# Patient Record
Sex: Male | Born: 1955 | Race: White | Hispanic: No | Marital: Single | Smoking: Never smoker
Health system: Southern US, Community
[De-identification: ages and names within clinical notes are randomized; demographics above are authoritative.]

## PROBLEM LIST (undated history)

## (undated) DIAGNOSIS — K5190 Ulcerative colitis, unspecified, without complications: Secondary | ICD-10-CM

## (undated) DIAGNOSIS — F329 Major depressive disorder, single episode, unspecified: Secondary | ICD-10-CM

## (undated) DIAGNOSIS — N529 Male erectile dysfunction, unspecified: Secondary | ICD-10-CM

## (undated) DIAGNOSIS — D497 Neoplasm of unspecified behavior of endocrine glands and other parts of nervous system: Secondary | ICD-10-CM

## (undated) DIAGNOSIS — Z932 Ileostomy status: Secondary | ICD-10-CM

## (undated) DIAGNOSIS — E291 Testicular hypofunction: Secondary | ICD-10-CM

## (undated) DIAGNOSIS — F32A Depression, unspecified: Secondary | ICD-10-CM

## (undated) DIAGNOSIS — D352 Benign neoplasm of pituitary gland: Secondary | ICD-10-CM

## (undated) DIAGNOSIS — H8110 Benign paroxysmal vertigo, unspecified ear: Secondary | ICD-10-CM

## (undated) DIAGNOSIS — K828 Other specified diseases of gallbladder: Secondary | ICD-10-CM

## (undated) DIAGNOSIS — K56609 Unspecified intestinal obstruction, unspecified as to partial versus complete obstruction: Secondary | ICD-10-CM

## (undated) DIAGNOSIS — Z9289 Personal history of other medical treatment: Secondary | ICD-10-CM

## (undated) DIAGNOSIS — C801 Malignant (primary) neoplasm, unspecified: Secondary | ICD-10-CM

## (undated) DIAGNOSIS — T7840XA Allergy, unspecified, initial encounter: Secondary | ICD-10-CM

## (undated) HISTORY — DX: Ileostomy status: Z93.2

## (undated) HISTORY — DX: Ulcerative colitis, unspecified, without complications: K51.90

## (undated) HISTORY — DX: Depression, unspecified: F32.A

## (undated) HISTORY — DX: Malignant (primary) neoplasm, unspecified: C80.1

## (undated) HISTORY — DX: Major depressive disorder, single episode, unspecified: F32.9

## (undated) HISTORY — PX: KNEE ARTHROSCOPY: SUR90

## (undated) HISTORY — DX: Other specified diseases of gallbladder: K82.8

## (undated) HISTORY — DX: Testicular hypofunction: E29.1

## (undated) HISTORY — DX: Benign neoplasm of pituitary gland: D35.2

## (undated) HISTORY — DX: Benign paroxysmal vertigo, unspecified ear: H81.10

## (undated) HISTORY — DX: Unspecified intestinal obstruction, unspecified as to partial versus complete obstruction: K56.609

## (undated) HISTORY — DX: Allergy, unspecified, initial encounter: T78.40XA

## (undated) HISTORY — DX: Male erectile dysfunction, unspecified: N52.9

---

## 1998-01-08 ENCOUNTER — Ambulatory Visit (HOSPITAL_COMMUNITY): Admission: RE | Admit: 1998-01-08 | Discharge: 1998-01-08 | Payer: Self-pay | Admitting: Gastroenterology

## 1999-08-11 ENCOUNTER — Encounter: Payer: Self-pay | Admitting: Internal Medicine

## 1999-08-11 ENCOUNTER — Encounter: Admission: RE | Admit: 1999-08-11 | Discharge: 1999-08-11 | Payer: Self-pay | Admitting: Internal Medicine

## 1999-08-21 ENCOUNTER — Encounter (HOSPITAL_COMMUNITY): Admission: RE | Admit: 1999-08-21 | Discharge: 1999-11-19 | Payer: Self-pay | Admitting: Gastroenterology

## 2000-01-22 ENCOUNTER — Encounter: Admission: RE | Admit: 2000-01-22 | Discharge: 2000-03-08 | Payer: Self-pay | Admitting: Internal Medicine

## 2001-04-12 HISTORY — PX: COLON SURGERY: SHX602

## 2002-01-30 ENCOUNTER — Ambulatory Visit (HOSPITAL_COMMUNITY): Admission: RE | Admit: 2002-01-30 | Discharge: 2002-01-30 | Payer: Self-pay | Admitting: Gastroenterology

## 2002-02-12 ENCOUNTER — Encounter (INDEPENDENT_AMBULATORY_CARE_PROVIDER_SITE_OTHER): Payer: Self-pay | Admitting: Specialist

## 2002-02-12 ENCOUNTER — Inpatient Hospital Stay (HOSPITAL_COMMUNITY): Admission: RE | Admit: 2002-02-12 | Discharge: 2002-02-20 | Payer: Self-pay | Admitting: General Surgery

## 2005-04-19 ENCOUNTER — Emergency Department (HOSPITAL_COMMUNITY): Admission: EM | Admit: 2005-04-19 | Discharge: 2005-04-20 | Payer: Self-pay | Admitting: Emergency Medicine

## 2007-04-13 DIAGNOSIS — C801 Malignant (primary) neoplasm, unspecified: Secondary | ICD-10-CM

## 2007-04-13 DIAGNOSIS — D497 Neoplasm of unspecified behavior of endocrine glands and other parts of nervous system: Secondary | ICD-10-CM

## 2007-04-13 HISTORY — DX: Malignant (primary) neoplasm, unspecified: C80.1

## 2007-04-13 HISTORY — PX: RECTAL SURGERY: SHX760

## 2007-04-13 HISTORY — DX: Neoplasm of unspecified behavior of endocrine glands and other parts of nervous system: D49.7

## 2007-09-28 ENCOUNTER — Inpatient Hospital Stay (HOSPITAL_COMMUNITY): Admission: AD | Admit: 2007-09-28 | Discharge: 2007-10-09 | Payer: Self-pay | Admitting: Internal Medicine

## 2007-10-20 ENCOUNTER — Inpatient Hospital Stay (HOSPITAL_COMMUNITY): Admission: AD | Admit: 2007-10-20 | Discharge: 2007-10-26 | Payer: Self-pay | Admitting: Internal Medicine

## 2008-07-23 IMAGING — CR DG ABDOMEN 2V
2 series · 2 of 2 positions shown · non-contrast
Comparison: 09/29/2007.

CLINICAL DATA: Followup small bowel obstruction.

ABDOMEN - 2 VIEW

[w abdomen upright]
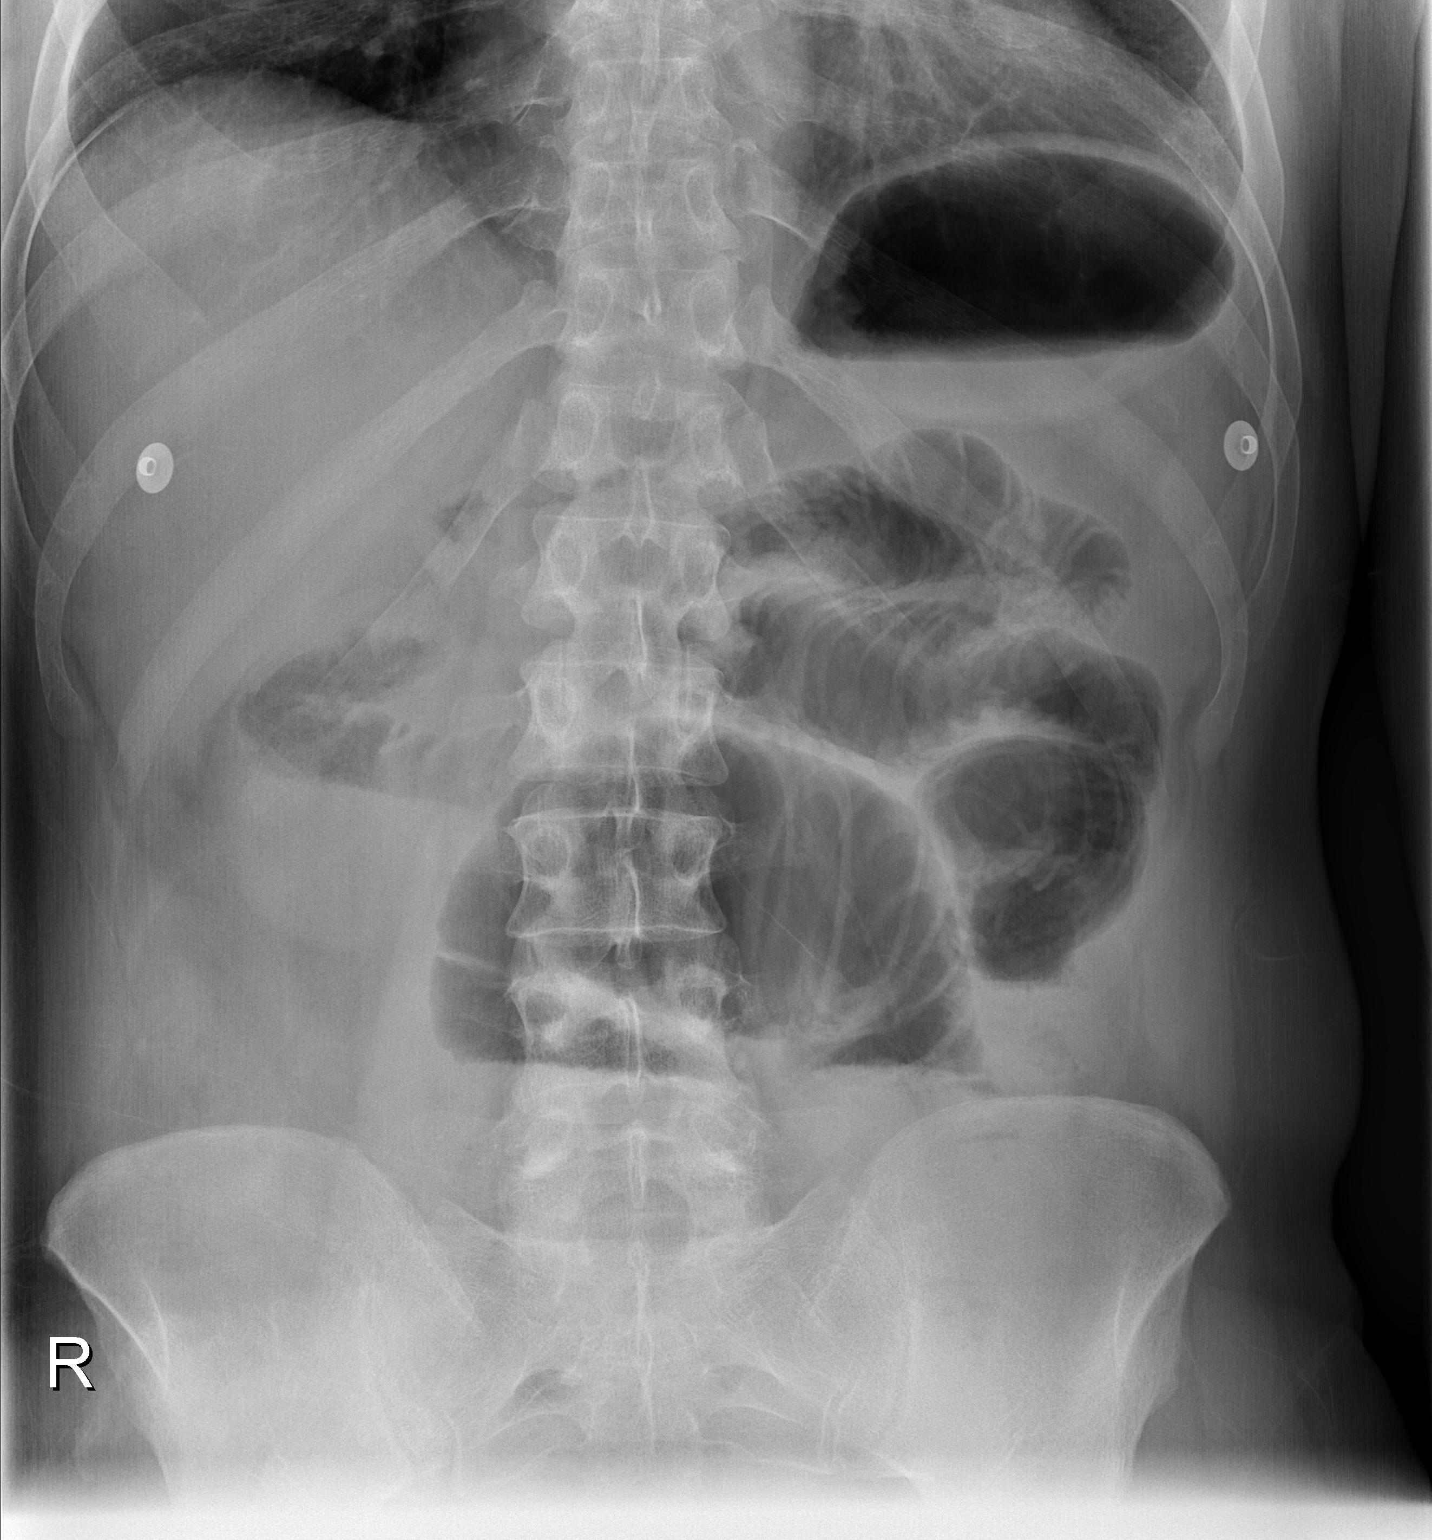

[t abdomen supine]
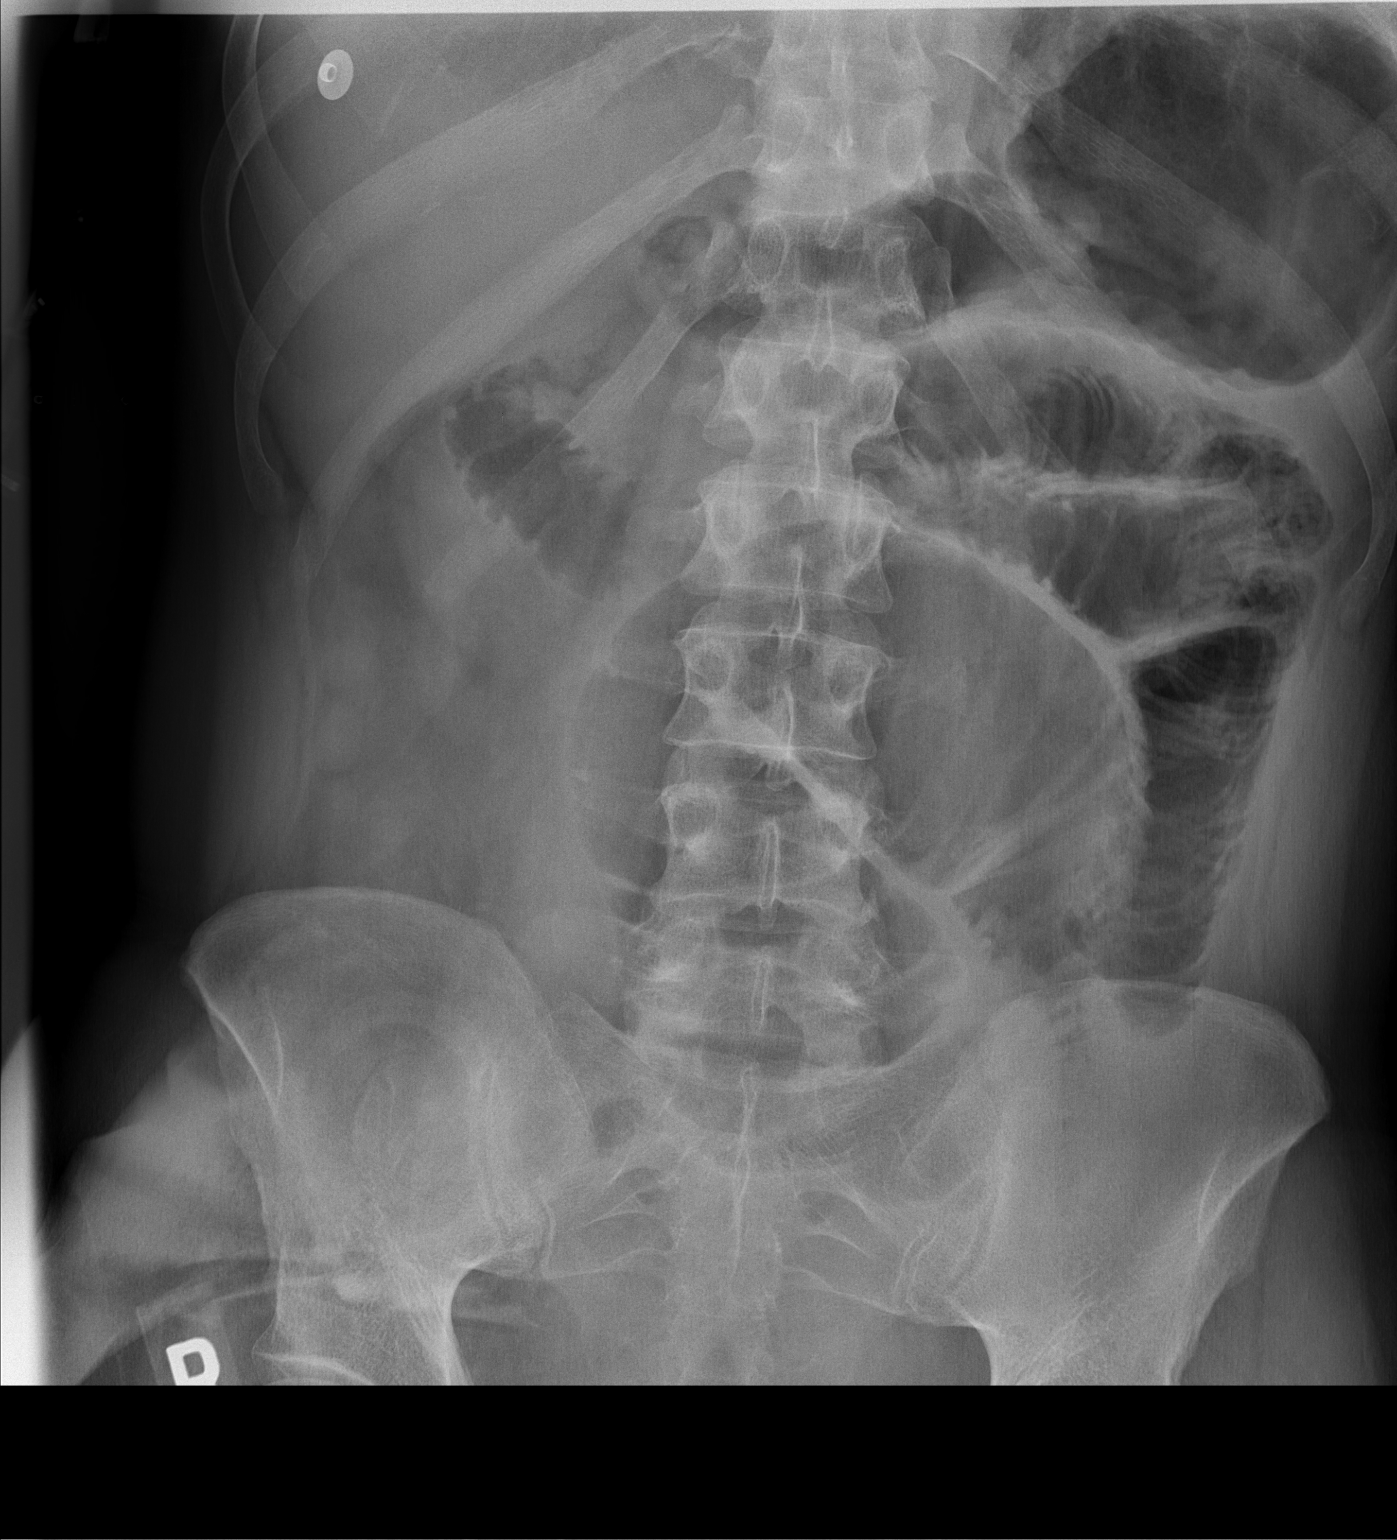

[2 of 2 positions shown; findings below may reference images not displayed]

FINDINGS: Prominent small bowel obstructive pattern with small
bowel measuring up to 8.3 cm versus prior 7.8 cm.  This appears to
be centered in the ileum.  Right lower abdominal/pelvic colostomy
formation.  No free intraperitoneal air.
IMPRESSION: Persistent small bowel obstructive pattern with progressive
dilation of small bowel loops as noted above.

## 2008-07-27 IMAGING — CR DG ABDOMEN 2V
2 series · 2 of 2 positions shown · non-contrast
Comparison: 10/01/2007

CLINICAL DATA: Small bowel obstruction

ABDOMEN - 2 VIEW

[w abdomen upright]
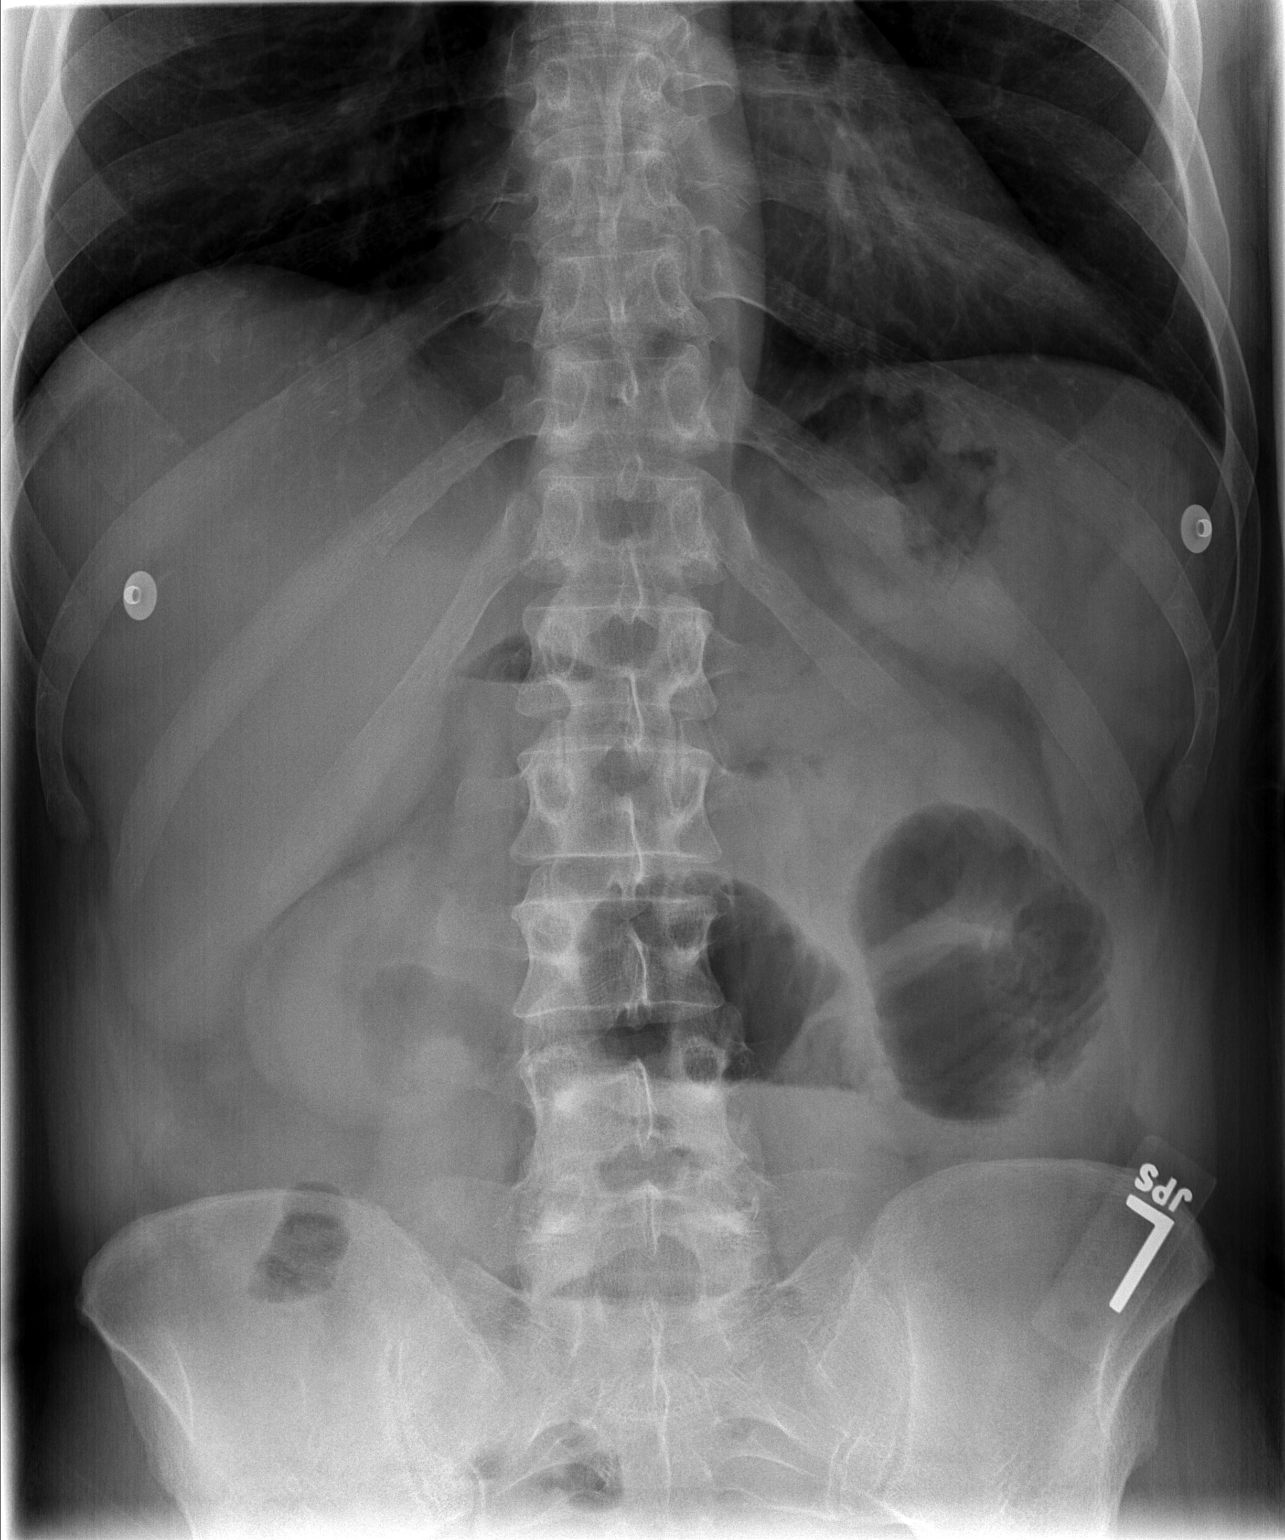

[t abdomen supine]
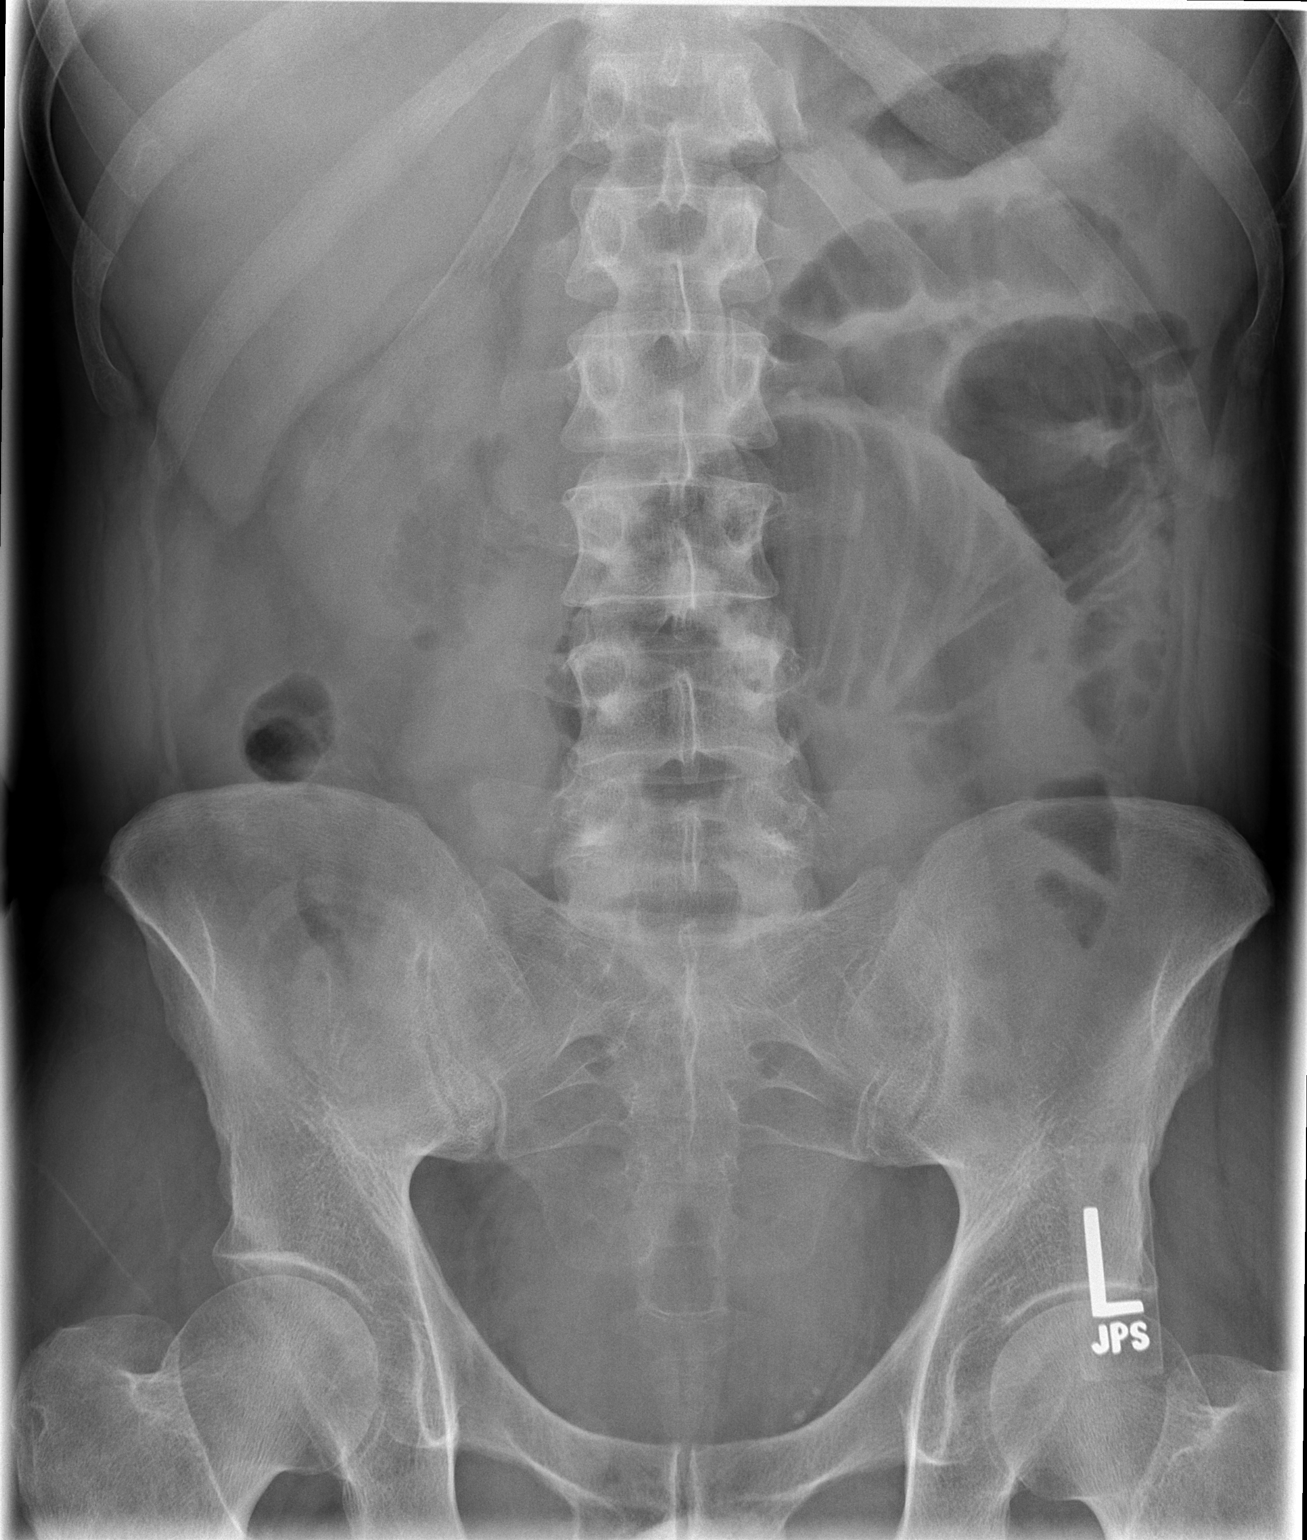

[2 of 2 positions shown; findings below may reference images not displayed]

FINDINGS: Upright abdomen shows no evidence for intraperitoneal
free air.  On the supine film, there is persistent marked dilation
of a small bowel loop in the left abdomen, measuring nearly 8 cm in
diameter.  Overall, there is been no substantial change in the
bowel gas pattern with most of the visualized air in the left upper
quadrant, suggesting proximal small bowel location.
IMPRESSION: No intraperitoneal free air.

Persistent marked dilation of a small bowel loop in the left
abdomen, measuring nearly 8 cm in diameter.  No interval change in
the bowel gas pattern.

## 2008-07-29 IMAGING — CR DG ABDOMEN 2V
2 series · 2 of 2 positions shown · non-contrast
Comparison: Two-view abdomen x-rays yesterday and dating back to
09/29/2007.

CLINICAL DATA: Follow up small bowel obstruction.

ABDOMEN - 2 VIEW 10/05/2007:

[w abdomen upright]
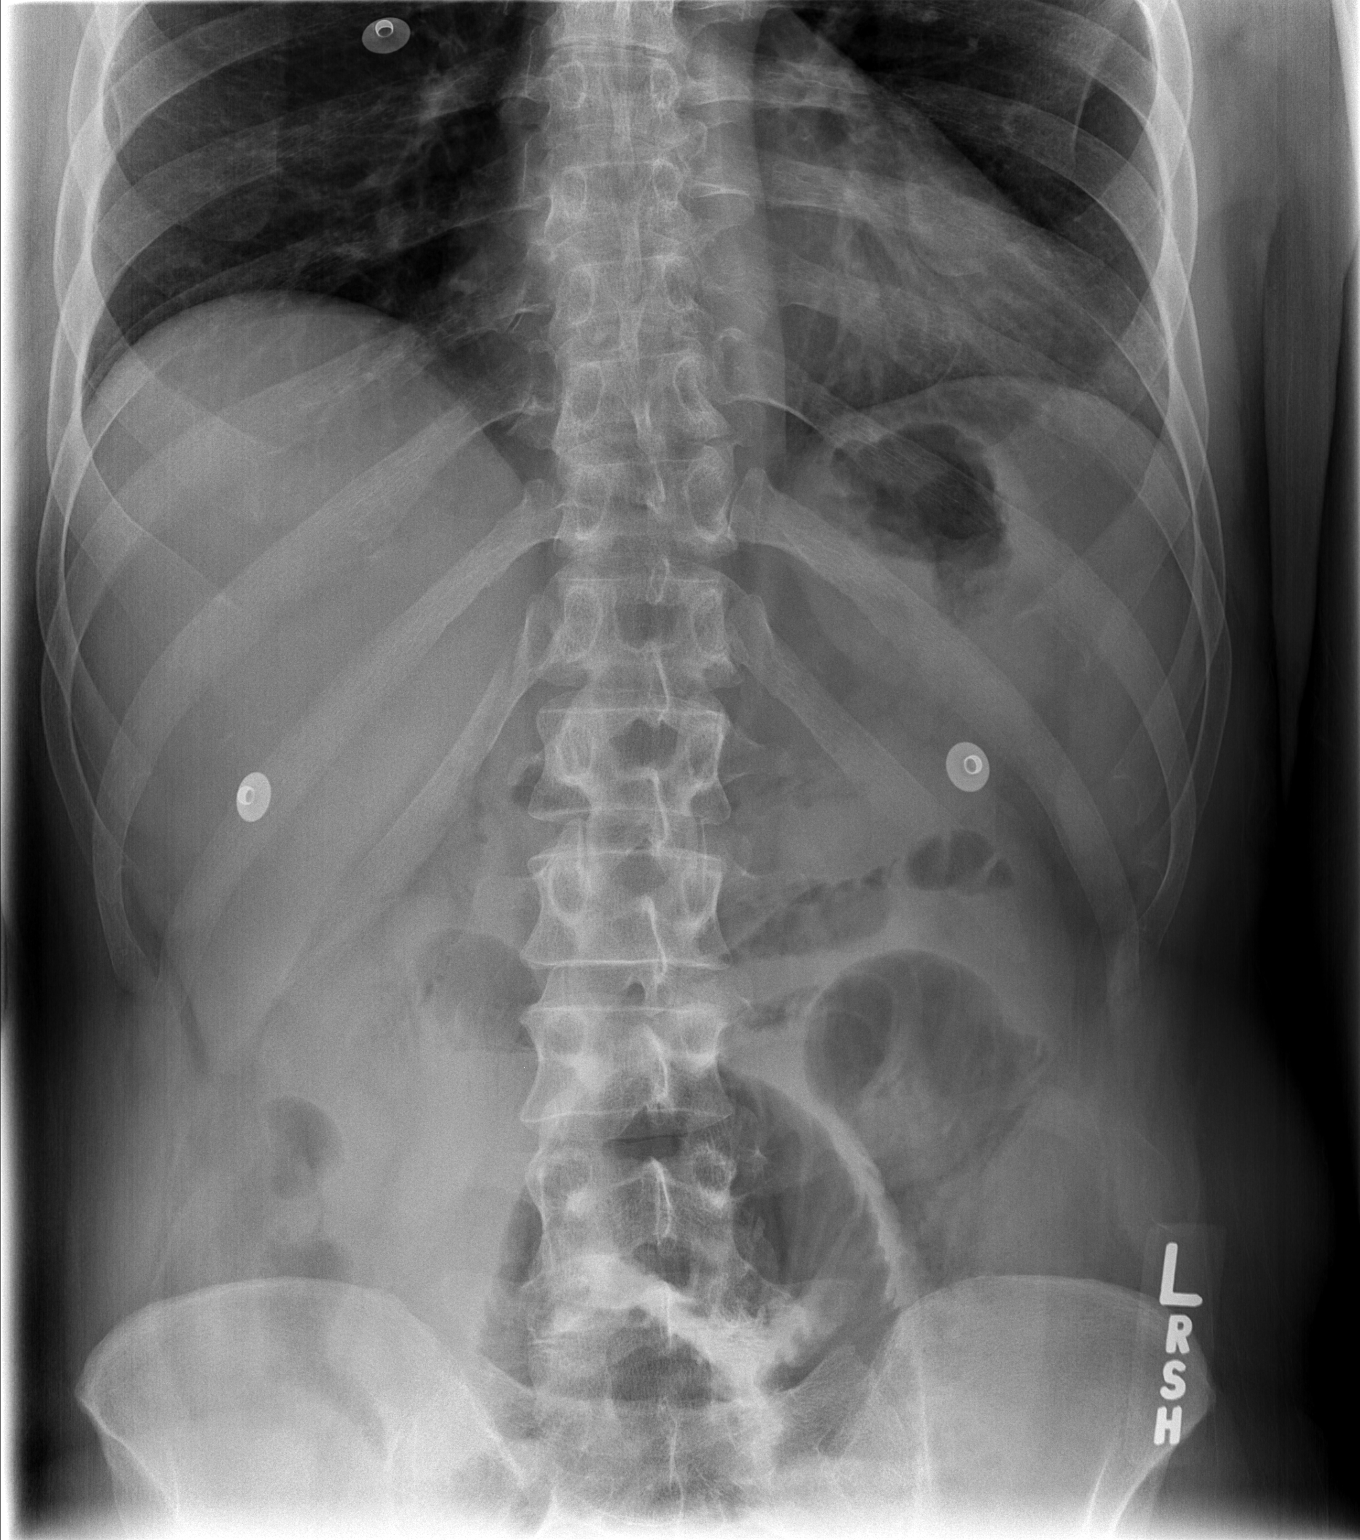

[t abdomen supine]
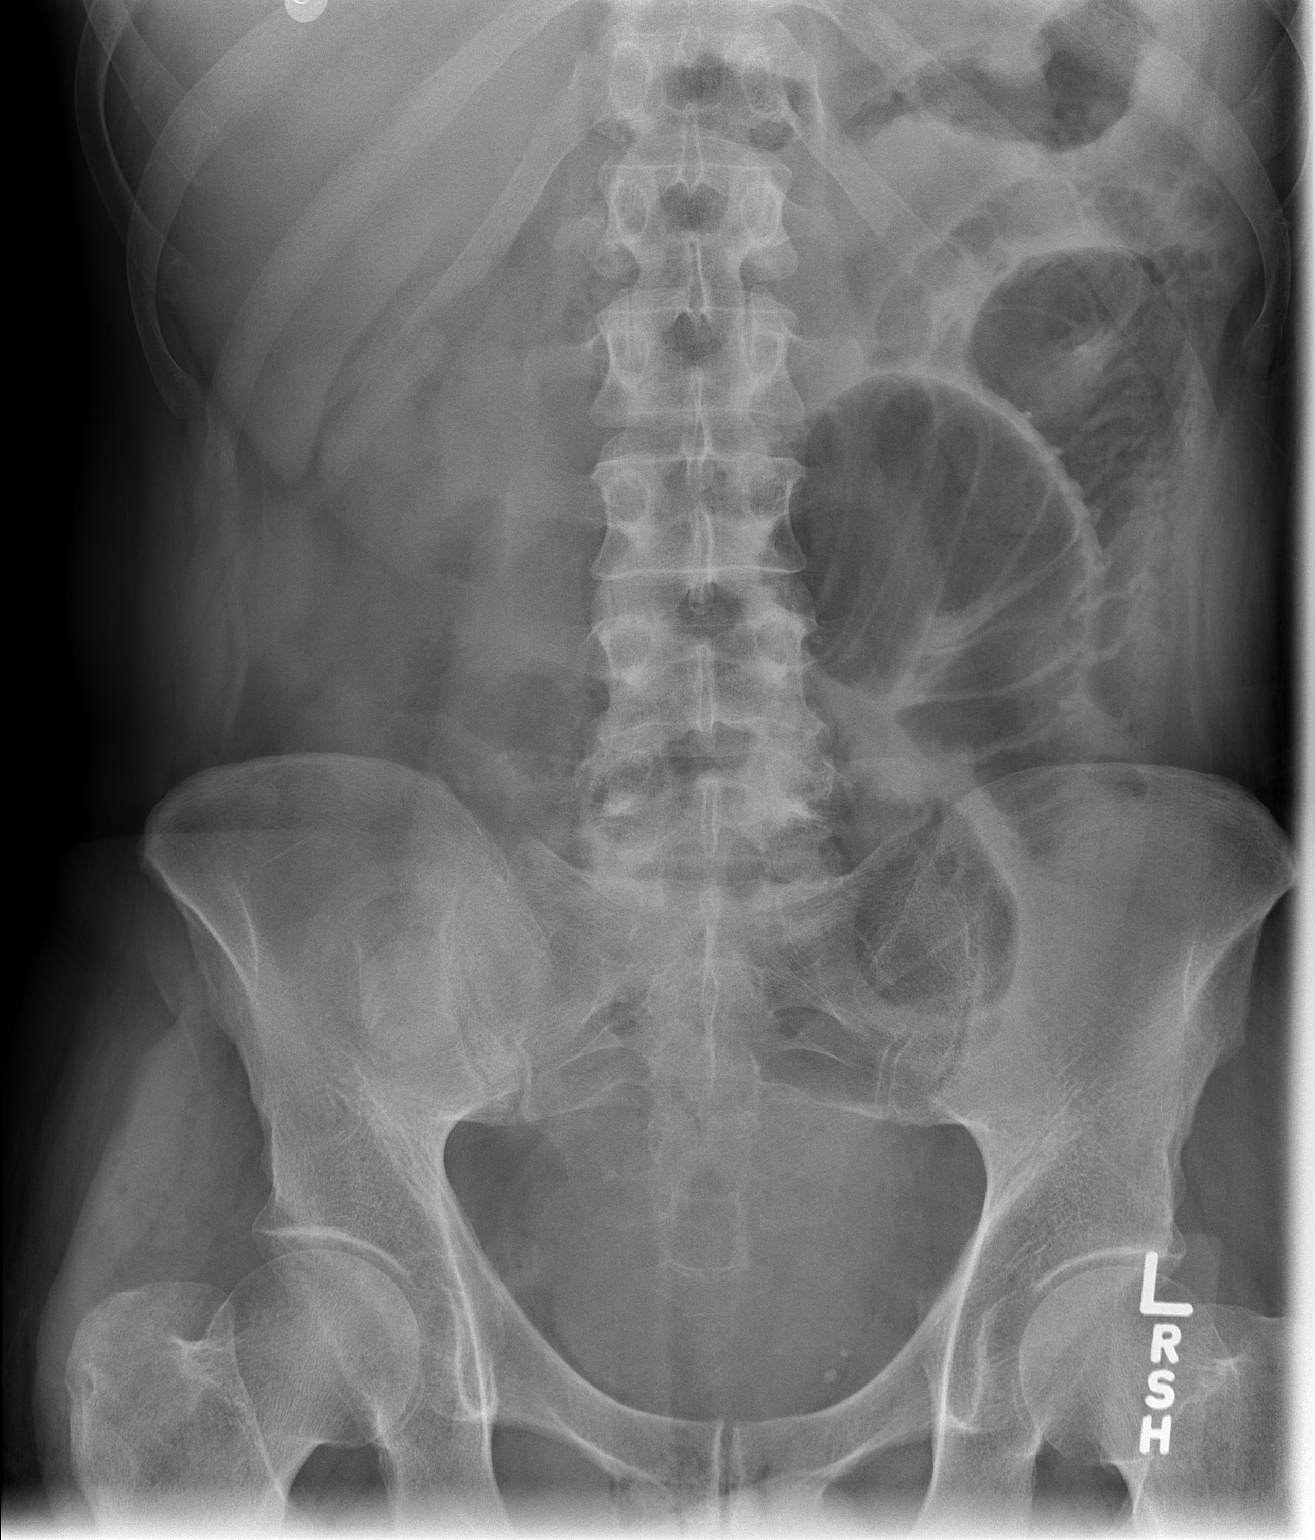

[2 of 2 positions shown; findings below may reference images not displayed]

FINDINGS: Persistent marked dilation of a few loops of jejunum in
the left upper quadrant, not significantly changed since yesterday.
The overall bowel gas pattern has improved slightly over the past 6
days.  Air-fluid levels present on the erect image.  No evidence of
free intraperitoneal air.  Very little colonic bowel gas.
Phleboliths again noted in the left side of the pelvis.
IMPRESSION: No significant change in the high-grade partial small bowel
obstruction since yesterday.  No free intraperitoneal air.  Mild
improvement in the bowel gas pattern over the past 6 days.

## 2009-06-11 ENCOUNTER — Encounter: Admission: RE | Admit: 2009-06-11 | Discharge: 2009-06-11 | Payer: Self-pay | Admitting: Internal Medicine

## 2010-04-09 ENCOUNTER — Encounter
Admission: RE | Admit: 2010-04-09 | Discharge: 2010-04-09 | Payer: Self-pay | Source: Home / Self Care | Attending: Internal Medicine | Admitting: Internal Medicine

## 2010-08-25 NOTE — H&P (Signed)
NAME:  [name], Brandon [name]                ACCOUNT NO.:  [id number]   MEDICAL RECORD NO.:  [id number]          PATIENT TYPE:  INP   LOCATION:                               FACILITY:  [name]   PHYSICIAN:  [name], M.D.DATE OF BIRTH:  March 21, 1956   DATE OF ADMISSION:  10/20/2007  DATE OF DISCHARGE:                              HISTORY & PHYSICAL   CURRENT HISTORY:  Mr. Brandon [name] a very pleasant but unfortunate 55-  year-old male with recent hospitalization at [name] in  early June for a small bowel obstruction and persistent surgical wound  where he recently had removal of a rectal pouch at the [name]  in May of 2009.   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  Apparently his wound depth  was only 1 cm when he left the [name] 2 days ago but today the  wound depth is 6 cm at one end of the wound.  It is draining purulent  fluid.   Nutrition is improved with TNA therapy and recent tolerance of full  liquids.  However, his condition is tenuous and the surgical wound seems  to have rapidly changed within 48 hours and almost certainly needs  reapplication of a wound VAC and to also be cultured for possible  antibiotic therapy.  He also needs ongoing TNA therapy, which has not  been arranged yet here in [name].   His clinical condition is complicated by the fact that he had radiation  therapy and chemotherapy prior to the removal of the rectal pouch  carcinoma in May, likely retarding wound healing as well.   He is now admitted for prompt reinitiation of wound VAC because of his  rapid decline in the wound status over 48 hours and continued parenteral  nutrition.  The hope is for a 4-7 day stay only.   ALLERGIES:  CODEINE AND  IMURAN CAUSED NAUSEA AND VOMITING.  EFFEXOR  GIVES HIM A FLAT FEELING.   MEDICATIONS:  TNA therapy.   PAST MEDICAL HISTORY:  1. Ulcerative colitis diagnosed in the 1970s.  He ultimately treated      with colectomy per Dr. Kendrick [name] in 2003.  2. Allergic rhinitis.  3. 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  4. Ileostomy.  5. Chronic slowly resolving partial small bowel obstruction since      abdominal surgery in May of 2009.  Currently beginning to tolerate      fluid, full liquids, but parenteral nutrition continues to be      advised.  6. Developing depression.  No recurrent clinical status.   PAST SURGICAL HISTORY:  1. As above.  2. Knee surgery in December of 1990.   FAMILY HISTORY:  Prostate cancer in his father.  Some anxiety in his  siblings and parents.   SOCIAL HISTORY:  No tobacco use or alcohol use.  He is single.  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   REVIEW OF SYSTEMS:  No chest pain or shortness of breath.  No fevers or  chills.  Markedly depressed mood though non suicidal.  No recent nausea  or vomiting.  He continues to have drainage from the peri sacral wound.   PHYSICAL EXAMINATION:  An alert, oriented male in no acute distress.  Affect is very somber.  VITAL SIGNS:  Weight 164.  Temperature 98.7.  Pulse 108 and regular.  Blood pressure 106/82.  Respirations 16, nonlabored.  HEENT:  Exam benign.  He wears glasses.  NECK:  Supple without JVD or thyromegaly.  No lymphadenopathy.  CHEST:  Clear to auscultation.  CARDIAC:  Exam reveals a regular rhythm.  No murmurs or gallops.  ABDOMEN:  Soft, nontender.  Nondistended.  Bowel sounds are normal.  No  rebound.  Ileostomy site within normal limits.  GU:   Not examined.  Gluteal cleft area reveals a 5-cm long wound that is  approximately 1.5 cm in depth with good granulation tissue except at the  lowest area of the wound.  In the gluteal cleft there is an  approximately 6 cm deep cavity which is was probed with a sterile Q-tip,  which is draining brown, serous, slightly foul-smelling fluid.  This was  sent for culture.  EXTREMITIES:  Without cyanosis, clubbing or edema.  NEUROLOGICAL:  Nonfocal.  SKIN:  Without rashes.   ASSESSMENT:  A 55 year old male with a persistent surgical wound over  the lower perisacral area which appears to be worsening rapidly.  He  apparently had had a great improvement with a wound vacuum-assisted  closure at the [name] which was removed just 2 days ago.  He  has a partial small bowel obstruction which is improving but recommend  that he continue total nutrient admixture until eating very well without  signs of recurrent bowel obstruction.   PLAN:  1. Perisacral wound - cultures sent from my office and will repeat      culture at [name] and also have the wound care team see the      patient to immediately resume wound VAC care and also further      recommendations from them.  May consider antibiotic therapy as      well.  He had MRSA in the wound at [name] 2 weeks ago.  2. Partial small bowel obstruction - symptomatically improved.      Continue full liquid diet and TNA while      hospitalized.  Check abdominal x-ray.  3. Depression - start Wellbutrin SR 100 mg p.o. daily.  4. We will check a CBC, CMET, PT/PTT and prealbumin level.      [name], M.D.  Electronically Signed     RNG/MEDQ  D:  10/20/2007  T:  10/20/2007  Job:  [id number]   cc:   Brandon Heckler, MD  [address]  [place]  [place] [address]   [name], M.D.  [address]  [place]  [place] [address]   Brandon Sciara, MD  [name]  [place], [place]

## 2010-08-25 NOTE — Consult Note (Signed)
NAME:  BERNHARD, KOSKINEN                ACCOUNT NO.:  [id number]   MEDICAL RECORD NO.:  [id number]          PATIENT TYPE:  INP   LOCATION:  3701                         FACILITY:  [facility]   PHYSICIAN:  Sharlet Salina T. Hoxworth, M.D.DATE OF BIRTH:  1956/03/11   DATE OF CONSULTATION:  DATE OF DISCHARGE:                                 CONSULTATION   CONSULTING SURGEON:  Sharlet Salina T. Hoxworth, M.D.   REASON FOR CONSULTATION:  Probable small bowel obstruction.   HISTORY OF PRESENT ILLNESS:  This is a 55 year old white male who has a  history of rectal cancer as well as ulcerative colitis for which he is  status post subtotal colectomy done by Dr. Earlene Plater several years ago as  well as most recently a rectal pouch removal and lysis of adhesions done  on Aug 23, 2007, at [facility] by Dr. Willette Pa.  Since the  patient has had his latest surgery, he has complained of some fullness  on the left side of the abdomen with some associated nausea.  Today, he  states that he had several bouts of emesis, but they were almost more  like dry heaves than they were actual emesis.  He states that at this  time he has lost approximately 20 pounds since his surgery in May as  well.  He also states that currently when he eats, it feels like there  is a hose in his belly moving everything around.  He says this is  quite uncomfortable.  Otherwise, he states that the ileostomy that he  had from his subtotal colectomy is still functioning; however, it does  have some decreased output; however, he says that is due to decrease in  his intake.  Recently, the patient saw Dr. Kevan Ny on September 26, 2007, due  to these problems.  At that time, an x-ray was done, which showed a  probable small bowel obstruction.  At this time, the patient was sent  home and told to try a clear liquid diet and call Dr. Kevan Ny if he did  not improve.  Today, the patient did call Dr. Kevan Ny with no improvement  and at that time, the patient was  admitted here to the hospital for a  probable small bowel obstruction.  Therefore, Surgery was consulted.   REVIEW OF SYSTEMS:  See HPI.  Otherwise, all other systems are negative.   FAMILY HISTORY:  Noncontributory.   PAST MEDICAL HISTORY:  Significant for;  1. Ulcerative colitis for which he had a subtotal colectomy in 2003 by      Dr. Earlene Plater.  2. Allergic rhinitis.  3. Rectal pouch carcinoma for which he has been receiving treatment      with chemotherapy and radiation in January, February, and March of      this year.   PAST SURGICAL HISTORY:  1. Subtotal colectomy.  2. Rectal pouch removal and partial proctectomy.  3. Knee surgery in 1990.   SOCIAL HISTORY:  The patient is single.  He does not smoke or use  alcohol.   ALLERGIES:  Does not appear that he is allergic to  anything.   MEDICATIONS:  The patient states that he does not take any consistent  medications at home; however, recently he has been given a prescription  for either Zofran or Phenergan to help with his nausea at home.   PHYSICAL EXAMINATION:  GENERAL:  This is a very pleasant well-developed,  well-nourished 55 year old white male who is lying in bed in no acute  distress.  VITAL SIGNS:  Temperature 97.8,  pulse 102, respirations 18,  and blood pressure 111/82.  EYES:  Sclerae noninjected.  Pupils are equal, round, and reactive to  light.  EARS, NOSE, MOUTH, AND THROAT:  No obvious masses or lesions.  No  rhinorrhea.  Mouth is pink and moist.  Throat shows no exudate.  NECK:  Supple.  Trachea is midline.  No thyromegaly.  HEART:  Regular rhythm, somewhat tachycardiac.  Otherwise, no murmurs,  gallops or rubs are noted.  +2 carotid and pedal pulses bilaterally.  LUNGS:  Clear to auscultation bilaterally with no wheezes, rhonchi, or  rales noted.  Respiratory effort is nonlabored.  CHEST:  Symmetrical.  ABDOMEN:  Soft and nontender with some slight fullness in the left side  of his abdomen with some mild  distention.  Otherwise, the patient does  have active bowel sounds.  His ileostomy is present with liquid stool in  the bag.  Otherwise, his stoma is pink and healthy.  Otherwise, the  patient does have a midline incision that has healed well from his  previous surgery in May.  RECTAL:  At this time is deferred.  MUSCULOSKELETAL:  All four extremities are symmetrical with no cyanosis,  clubbing, or edema.  SKIN:  Warm and dry with no rashes, lesions, or masses.  NEURO:  Cranial nerves 2 through 12 appear to be grossly intact.  PSYCH:  The patient is alert and oriented x3 with an appropriate affect.   LABS AND DIAGNOSTICS:  There are currently no labs.  Abdominal x-ray was  taken at Dr. Kevan Ny' office on September 26, 2007, which showed a probable  small bowel obstruction; however, I do not have an actual report or film  of this x-ray.   IMPRESSION:  1. Probable partial small bowel obstruction.  2. History of rectal cancer.  3. Recent history of chemotherapy and radiation.   PLAN:  At this time, we will plan on getting abdominal x-rays today as  well as in the morning to follow along with progression of the  obstruction.  Otherwise, we will also get some labs in the morning as  well.  At this time, the patient is currently not nauseated on his  stomach and is not having any emesis.  However, if the patient begins to  get nauseated and has emesis, he may need an NG tube to help decompress  his stomach.  Otherwise, we will at this time follow along with you.     Letha Cape, PA      Lorne Skeens. Hoxworth, M.D.  Electronically Signed   KEO/MEDQ  D:  09/28/2007  T:  09/29/2007  Job:  [id number]   cc:   Candyce Churn, M.D.

## 2010-08-25 NOTE — Discharge Summary (Signed)
NAME:  [name], [name] NO.:  [id number]   MEDICAL RECORD NO.:  [id number]          PATIENT TYPE:  INP   LOCATION:  3701                         FACILITY:  [facility]   PHYSICIAN:  Candyce Churn, M.D.DATE OF BIRTH:  Dec 14, 1955   DATE OF ADMISSION:  09/28/2007  DATE OF DISCHARGE:  10/09/2007                               DISCHARGE SUMMARY   DISCHARGE DIAGNOSES:  1. Partial small bowel obstruction improved.  2. History of rectal pouch carcinoma diagnosed in December 2008 and      early January 2009 treated with chemotherapy and radiation and      subsequent surgical therapy in May 2009 at the [facility].  3. 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 sterile gauze and nasal saline packing.   DISCHARGE MEDICATIONS:  None except the patient will need two saline  flushes for his PICC prior to being seen by Dr. Wynelle Bourgeois tomorrow at the  [facility].   HOSPITAL COURSE:  Mr. [name] is a very pleasant but unfortunate 55-  year-old male who had extensive abdominal surgery in May 2009 at the  [facility] for removal of his rectal pouch following chemotherapy  and radiation therapy for rectal pouch cancer.  Approximately a week  after surgery he developed abdominal bloating, anorexia, and very poor  p.o. intake.  He presented to my office on September 26, 2007 with a partial  small bowel obstruction by x-ray, and after a trial of liquid therapy  for 48 hours symptoms had not improved.  He was admitted on September 28, 2007 to [facility], and a PICC line was placed and TNA was  started and bowel was put to rest.   He has slowly improved over the 10 day hospitalization, and is now  tolerating liquids and applesauce.  He has not tolerated the pureed diet  apparently secondary to taste.   He  was seen by the wound care team, and during the first five days of  admission had pulse lavage of his perianal wound, and culture from  admission revealed methicillin-resistant staph aureus.  The wound has  improved a great deal with b.i.d. packing, and wound lavage was  discontinued five days after admission.  It continues to be granulating  well, and is being packed twice a day with sterile saline and gauze.   PROCEDURES WHILE ADMITTED:  Abdominal pelvic CT on September 28, 2007  revealing a partial small bowel obstruction felt to be at the level of  the pelvis.  There were likely reactive lymph nodes in the small bowel  mesentery.  There was a gas collection in the low pelvis suspicious for  infection that was adjacent to the skin surface, but physical exam did  not bear out evidence of abscess or infection.  The patient did undergo  a small bowel follow-through on October 05, 2007 which revealed again a  partial small-bowel obstruction, but  the patient could not tolerate  larger volume oral contrast to further delineate the obstruction.  Contrast did progress to the ileostomy bag.  Abdominal x-ray from October 05, 2007 revealed persistent marked dilatation of a few loops of the  jejunum in the left upper quadrant with an overall improvement in gas  pattern relative to admission.  Air fluid levels were still present on  the erect images.  There was no evidence of free intraperitoneal air.   IMPRESSION:  No significant change in the high grade partial small bowel  obstruction, but mild improvement in gas pattern over the previous six  days.   LABORATORIES ON DISCHARGE:  Cholesterol 73 mg/dL, triglycerides 84, HDL  14, LDL 42, total cholesterol/HDL ratio 5.2.  Phosphorus 4.7.  Magnesium  2.  CMET revealed sodium 138, potassium 4.1, chloride 103, bicarb 27,  glucose 129, BUN 10, creatinine 0.6.  CBC from June 29 revealed white  count 5000, hemoglobin 10.1, platelet count 299,000.  Prealbumin on   admission October 09, 2007 was 9.6, and this had improved to 14.4 by October 05, 2007.   The patient will be discharged with follow-up at the [facility] on  October 10, 2007.  He is to see Dr. Corena Pilgrim in consultation at  that time.  He and his father will make the trip this afternoon, and he  will need readmission tomorrow to continue wound care and for further  evaluation by Dr. Wynelle Bourgeois.  We will discharge him with supplies to flush  his PICC line twice prior to seeing Dr. Wynelle Bourgeois.      Candyce Churn, M.D.  Electronically Signed     RNG/MEDQ  D:  10/09/2007  T:  10/09/2007  Job:  [id number]   cc:   Corena Pilgrim, M.D.

## 2010-08-25 NOTE — Discharge Summary (Signed)
NAME:  [name], [name] NO.:  [id number]   MEDICAL RECORD NO.:  [id number]          PATIENT TYPE:  INP   LOCATION:  1307                         FACILITY:  [facility]   PHYSICIAN:  Candyce Churn, M.D.DATE OF BIRTH:  1956/03/11   DATE OF ADMISSION:  10/20/2007  DATE OF DISCHARGE:  10/26/2007                               DISCHARGE SUMMARY   DISCHARGE DIAGNOSES:  1. Perineal surgical wound with sinus tract following removal of anal      pouch in May 2009.  2. Partial small bowel obstruction, resolved.  3. History of ulcerative colitis with colectomy in 2003 per Dr. Kendrick Ranch.  4. 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  5. Ileostomy.  6. Situational depressed mood,improving.   CONSULTATIONS:  General surgery - Dr. Claud Kelp.   HOSPITAL COURSE:  Mr. Kinzler is a very pleasant but unfortunate 55-year-  old male who underwent rectal pouch carcinoma surgery approximately 2  months ago followed by a persistent partial small-bowel obstruction for  the last 6-7 weeks.  This is finally resolving.   He was seen last week in my office with the thought that he had good  healing of his perineal wound site having used a Wound VAC for  approximately 8 days at the [facility] the week before.  In my  office last week, he was found to still have a persistent 6-7 meter  wound tract.  This had a copious amount of drainage at the time of  admission.  Cultures from my office and on the day admission on October 20, 2007, and October 21, 2007, were negative for MRSA,  just mixed flora.   The patient was started on a Wound VAC but was continuing to get  collapse of his sinus tract and he was seen in consultation by Dr.  Claud Kelp who recommended  non-iodoform packing of the wound and no  Wound VAC and b.i.d. dressing changes.   It was felt that this wound will likely take quite some time the heal  and [facility] has reassured the patient that b.i.d. wound care  will be performed with no exception.  Plans have been made to perform  this as an outpatient.   He was admitted with a PICC for planned continuation of TNA therapy at  home but he is eating quite well and the TNA tends to reduce his  appetite and we are going to stop his PICC and discontinue parenteral  nutrition.   Overall, he is doing much better with the exception of the persistent  perineal wound which hopefully will heal over time.   DISCHARGE LABORATORIES:  Include the following:  Prealbumin from October 25, 2007, was 17.4 with normal being 18-45 mg per deciliter.  Prealbumin  on October 23, 2007 was  17.6 - stable.  Wound culture from October 20, 2007,  grew multiple organisms non-predominant.  There was no Staph aureus  isolated.  Wound culture from October 19, 2007, in my office revealed no  Staph aureus as well.  Labs from October 23, 2007, are as follows:  CBC  revealed a white count of 7400, hemoglobin 10.4, platelet count 294,000  with 80% neutrophils.  C-Met revealed sodium 138, potassium 4.3,  chloride 106, bicarb 25, glucose 104, BUN 17, creatinine 0.55.  Magnesium was 1.9 - normal.  Phosphorus 3.5 - normal.  Total cholesterol  was 101 and triglycerides were 73.   A sinus tract injection fistulogram was performed on the perineal wound  revealing a 4 x 6 x 2.5-cm cavity in the perineum without evidence of  communication to adjacent bowel or bladder.   Portable chest x-ray on October 21, 2007, revealed the PICC line in good  position.   CONDITION ON DISCHARGE:  Improved and we will plan to have him follow  with Dr. Claud Kelp in 2 weeks and if any complications arise in the  meantime can follow up with Dr. Derrell Lolling earlier or in my office as well.   DISCHARGE  MEDICATIONS:  1. Tylenol 650 mg q.4 hours p.r.n.  2. Phenergan 12.5-25 mg p.r.n. nausea.      Candyce Churn, M.D.  Electronically Signed     RNG/MEDQ  D:  10/26/2007  T:  10/26/2007  Job:  [id number]   cc:   Angelia Mould. Derrell Lolling, M.D.  [address]  [place]  [place] [address]   Thora Lance, M.D.  Fax: [phone]

## 2010-08-25 NOTE — H&P (Signed)
NAME:  Brandon [name], Brandon [name] NO.:  [id number]   MEDICAL RECORD NO.:  [id number]          PATIENT TYPE:  INP   LOCATION:  3701                         FACILITY:  [facility]   PHYSICIAN:  Candyce Churn, M.D.DATE OF BIRTH:  [birth year]   DATE OF ADMISSION:  09/28/2007  DATE OF DISCHARGE:                              HISTORY & PHYSICAL   CHIEF COMPLAINT:  Nausea, weight loss, left abdominal pain and left  abdomen probable small bowel obstruction.   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  He returns to my office today having  been seen 2 days ago with an x-ray showing probable small bowel  obstruction.  Symptomatically, he is not improving on a liquid diet.  His father is present with him who is very supportive.   Last week, he saw Dr. Darnell Level for removal of some suture material in  the rectal pouch surgical area and this is healing much better with  packing by a home nurse daily.   His principal concern is that he is having very active bowel sounds with  nausea, poor appetite, weight loss and left abdominal pain and this is  ongoing and probably progressive over the last 3 weeks.  He had multiple  adhesions at the time of his surgery which had to be released  laparoscopically at the time of his surgery on Aug 23, 2007.  It took  approximately an hour and half to lyse all the adhesions, and he  apparently had three small bowel or so during the lysis of adhesions.   He is now being admitted for more aggressive therapy and resolution of  the small bowel obstruction, partial or not.   PAST MEDICAL HISTORY:  1. Ulcerative colitis diagnosed in the 1970s.  Treated with colectomy      per Dr. Kendrick Ranch in 2003.  2. Allergic rhinitis.  3. 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   PAST SURGICAL HISTORY:  1. As above.  2. Knee surgery in December 1990.   FAMILY HISTORY:  Prostate cancer in his father.  Some anxiety in  siblings and parents.   SOCIAL HISTORY:  No smoking, no alcohol.  The patient is single.  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   REVIEW OF SYSTEM:  No chest pain or shortness of breath.  Positive for  nausea, weakness, intermittent vomiting and left abdominal pain.   PHYSICAL EXAMINATION:  GENERAL:  Alert male who appears washed out and  slightly pale.  Affect somewhat subdued.  VITAL SIGNS:  Reveal a weight of 164, down 4 pounds in 2  days and 12  pounds in April 07, 2007.  Temperature 97.3, pulse 108 and regular,  blood pressure 108/80, respiratory rate 16 and nonlabored.  HEENT:  Benign except for oral thrush.  NECK:  Supple without JVD.  CHEST:  Clear to auscultation.  CARDIAC:  Regular rhythm with no murmurs or gallop.  ABDOMEN:  Moderate distention and soft.  Moderate tenderness to  palpation without rebound on the left abdomen.  Bowel sounds are  increased but not high-pitched.  Perirectal area reveals a granulating  pouch which was packed with gauze, not malodorous, no exudates.  EXTREMITIES:  Without cyanosis, clubbing or edema.  NEUROLOGICAL:  Nonfocal.   Abdominal x-ray on September 26, 2007, reveals distended small bowel loops  with air-fluid levels in the mid abdomen with a prominent dilatation of  the small bowel loop within the mid abdomen.  Suspicious for small bowel  obstruction.   ASSESSMENT:  Partial small-bowel obstruction with inability to take  nutrition without significant persistent nausea and vomiting.  The  symptoms have been ongoing  for several weeks.  The patient has  significant weight loss and also continued abdominal discomfort.  He has  also had recent extensive surgery at the [facility] last month.   PLAN:  1. N.p.o. and start IV fluids.  2. PICC line placement, almost certain requirement after T&A.  3. Surgical consultation today.  4. Monitored bed.   Discussed the case with Dr. Darnell Level who has recommended surgical  consultation today from his practice.   The patient contacted the [facility] today and there was a  recommendation from the [facility] for our physicians here in  [place] to contact them prior to any surgical manipulation or  procedure.      Candyce Churn, M.D.  Electronically Signed     RNG/MEDQ  D:  09/28/2007  T:  09/29/2007  Job:  [id number]   cc:   Sheppard Plumber. Earlene Plater, M.D.  Velora Heckler, MD  Dr. Corena Pilgrim

## 2010-08-28 NOTE — Discharge Summary (Signed)
NAME:  Brandon [name], Brandon [name]                          ACCOUNT NO.:  [id number]   MEDICAL RECORD NO.:  [id number]                   PATIENT TYPE:  INP   LOCATION:  0373                                 [facility]ACILITY:  [facility]   PHYSICIAN:  Timothy E. Earlene Plater, M.D.              DATE OF BIRTH:  20-Dec-1955   DATE OF ADMISSION:  02/12/2002  DATE OF DISCHARGE:  02/20/2002                                 DISCHARGE SUMMARY   FINAL DIAGNOSIS:  Chronic ulcerative colitis.   OPERATIVE PROCEDURE:  Total abdominal colectomy with ileostomy, February 12, 2002.   HISTORY OF PRESENT ILLNESS:  This patient was seen in consultation multiple  times in the office.  He has been followed long-time by Dr. Reece Agar,  and he is ready to proceed with total abdominal colectomy for end-stage  chronic ulcerative colitis.  He was prepared at home.   HOSPITAL COURSE:  Evaluated upon admission, February 12, 2002, and underwent  the above-named procedure.  His immediate postoperative recovery was  satisfactory.  He remained alert and pleasant.  The ostomy remained good.  He did have some exacerbation of chronic low back pain that responded to  heat.  He was up and ambulatory and began to have active bowel function on  the fourth postoperative day.  He was slow to take liquids.  However, by the  seventh day he was taking full liquids satisfactorily.  By February 20, 2002, he was ready for discharge.  Somewhat depressed, bored with  hospitalization.  Was able to manage his ostomy.  Was tolerating a full-  liquid diet.  He lives with his parents, and they are prepared to take care  of him at home.  And so he was discharged on February 20, 2002, without  complications.  Pain medications only.  He has Valium to use on an as-needed  basis.   LABORATORY DATA:  His admission CBC was essentially negative.  A CBC on  February 14, 2002, was also satisfactorily normal.  Likewise, his chemistry  profile was normal.  PT was normal.  CEA  level was less than 0.5.   His EKG preoperatively demonstrated right bundle branch block.   FOLLOW-UP:  As noted, he will be followed closely as an outpatient.    PATHOLOGY:  Final pathology report did demonstrate chronic ulcerative  colitis.  There were no malignant changes noted on the pathology report.                                                Timothy E. Earlene Plater, M.D.    TED/MEDQ  D:  03/01/2002  T:  03/01/2002  Job:  [id number]   cc:   Danise Edge, M.D.  [address]  [address]  [place]  [place]  [address]  Fax: [phone]   Gaspar Garbe, M.D.

## 2010-08-28 NOTE — Op Note (Signed)
NAME:  Brandon [name], Brandon [name]                          ACCOUNT NO.:  [id number]   MEDICAL RECORD NO.:  [id number]                   PATIENT TYPE:  INP   LOCATION:  NA                                   [facility]ACILITY:  [facility]   PHYSICIAN:  Timothy E. Earlene Plater, M.D.              DATE OF BIRTH:  14-Oct-1955   DATE OF PROCEDURE:  02/12/2002  DATE OF DISCHARGE:                                 OPERATIVE REPORT   PREOPERATIVE DIAGNOSIS:  Chronic ulcerative colitis.   POSTOPERATIVE DIAGNOSIS:  Chronic ulcerative colitis.   PROCEDURE:  Total abdominal colectomy with ileostomy.   SURGEON:  Timothy E. Earlene Plater, M.D.   ASSISTANT:  Rose Phi. Maple Hudson, M.D.   ANESTHESIA:  CRNA supervised by Dr. Sandria Manly.   INDICATIONS FOR PROCEDURE:  Mr. Prabhu is 71, healthy, has chronic  ulcerative colitis for 27+ years.  He has now decided to proceed with an  abdominal colectomy which has been recommended to him for many years.  He is  currently well and healthy, diarrhea stools, abdominal pain manageable.  No  regular medication, occasional Pentasa, no steroids.  He has been carefully  counseled along with his mother and father on three occasions in the office,  and they are ready to proceed.  Colonoscopy recently done showed no evidence  of actual carcinoma.  The patient was prepared at home, seen and evaluated  this morning, identified, the permit signed, evaluated by anesthesia.   DESCRIPTION OF PROCEDURE:  He was taken to the operating room, placed  supine, general endotracheal anesthesia administered.  PAS hose, Foley  catheter, nasogastric tube placed.  The abdomen had been shaved.  It was  prepped, and draped in the usual fashion.  A long midline incision was used  to enter the abdominal cavity.  General exploration carried out showing a  somewhat foreshortened, thickened, heavy colonic mass throughout the colon,  i.e., the mass of the colon was heavy.  There was no actual isolated mass of  the colon.  The mesentery  contained many nodes, but the mesentery itself was  not inflamed or foreshortened.  The terminal ileum appeared perfectly  normal.  The other viscera were all thought to be normal.  Adhesions of the  peritoneum to the right gutter were divided with the cautery, and the right  colon was delivered into the incision.  A site was selected at the very-most  terminal, terminal ileum, and this was transected with the GIA staple  device.  The lateral peritoneum and medial peritoneum were carefully  isolated, and the mesentery was divided with Kelly clamps, making each  secure, tying each clamp proximally with 2-0 silk.  We ran it to the hepatic  flexure.  That, too, was carefully dissected, the mesentery then divided  between clamps.  Likewise, the transverse colon was treated in the same  fashion.  The splenic flexure was drawn down.  It was not high,  and its  mesentery was isolated and divided.  The descending colon was treated in the  same fashion by division of the lateral peritoneum, isolation of the  mesentery, and then careful clamping dividing of the mesentery brought [place]  down to the rectosigmoid.  The ureters were identified, were well-posterior  and retroperitoneal normally.  The sigmoid colon had been dissected, and the  rectum was dissected down to its approximate junction of the lower and  middle third.  The posterior mesentery was carefully divided between clamps,  and these were also tied with 2-0 silks.  At the site chosen, the rectum was  divided across with the reticulated TA staple device.  The end of the  remaining distal rectum was identified by two sutures of 2-0 Prolene cut one  inch long.  All areas were carefully checked for bleeding; none were found.  The specimen was passed off the field and sent to pathology fresh.  All  areas checked for hemostasis; hemostasis was good.  We then began to work on  the ileum.  We divided the mesentery off of the distal ileum for   approximately 4-6 cm, chose a site for an ileostomy in the right lower  quadrant through the rectus sheath.  We passed the ileum through the  ileostomy site after it was cut and fashioned and laid approximately 4-6 cm  of ileum out on the surface of the skin.  It lay nicely.  It was not  compromised, and its blood supply was obviously good.  The mesentery of the  small bowel was then tacked with 3-0 silk sutures along the lateral and  posterior peritoneum to the midline.  The remainder of the small bowel lay  in its normal anatomic position.  Copious irrigation was carried out.  Blood  loss estimated to be 50-100 cc, certainly no more.  All counts were correct.  The abdomen was closed in a single layer with #1 PDS suture.  Subcu  irrigated, skin closed with wide skin staples.   The ileostomy was meticulously crafted to produce a Brooke ileostomy with  the inferior pout by applying 3-0 and 4-0 Vicryl sutures between the  subcuticular, the wall of the ileum, and the end of the ileum.  This was  secure.  Again, the blood supply was obviously good.  It was viable, and it  lay nicely at this ileostomy site.  Following this, a carefully-placed  ileostomy appliance was placed over the ileostomy on the right lower  quadrant skin.  Final counts correct.  He tolerated it well, was awakened,  and taken to the recovery room in good condition.                                               Timothy E. Earlene Plater, M.D.    TED/MEDQ  D:  02/12/2002  T:  02/12/2002  Job:  [id number]   cc:   Gaspar Garbe, M.D.   Danise Edge, M.D.  [address]  [place]  [place] [address]  Fax: [phone]

## 2010-08-28 NOTE — Op Note (Signed)
NAME:  Brandon [name], Brandon [name]                          ACCOUNT NO.:  [id number]   MEDICAL RECORD NO.:  [id number]                   PATIENT TYPE:  AMB   LOCATION:  ENDO                                 FACILITY:  [facility]   PHYSICIAN:  Danise Edge, M.D.                DATE OF BIRTH:  12-18-1955   DATE OF PROCEDURE:  01/30/2002  DATE OF DISCHARGE:                                 OPERATIVE REPORT   INDICATIONS FOR PROCEDURE:  The patient is a 55 year old male born October 11, 1955.  The patient has chronic universal ulcerative proctocolitis.  In early  November of 2003, he is scheduled to undergo proctocolectomy to cure his  chronic universal ulcerative proctocolitis which has responded poorly to  medical therapy.   ENDOSCOPIST:  Danise Edge, M.D.   PREMEDICATION:  Versed 5 mg, fentanyl 50 mcg.   INSTRUMENT USED:  Olympus pediatric colonoscope.   DESCRIPTION OF PROCEDURE:  After obtaining informed consent, the patient was  placed in the left lateral decubitus position.  I administered intravenous  Versed and intravenous fentanyl to achieve conscious sedation for the  procedure.  The patient's blood pressure, oxygen saturation, and cardiac  rhythm were monitored throughout the procedure and documented in the medical  record.   Anal inspection was normal.  Digital rectal examination revealed a probable  anal stricture which was disrupted by digital examination.  The Olympus  pediatric colonoscope was introduced into the rectum and easily advanced to  the cecum due to the patient's shortened colon as a result of chronic  ulcerative proctocolitis.  A normal appearing ileocecal valve was intubated  and the distal ileum inspected.  Colonic preparation for the examination  today was excellent.   FINDINGS:  The patient has a normal ileocecal valve and distal ileum.  He  has universal ulcerative proctocolitis.  The entire rectum and left colon to  the level of 30 cm from the anal verge reveals  very severe mucosal colitis  with a cobblestone appearing mucosa and numerous inflammatory appearing  polyps.  There is spontaneous bleeding from the mucosa.  From 30 cm to the  anal verge to the cecum, the mucosa demonstrates a generalized loss in the  mucosal vascular pattern with scattered mucosal friability and less intense  inflammation.  There are no strictures or obstructions noted in the rectum  or colon.  There is no endoscopic evidence for the presence of colorectal  neoplasia, although, by the inflammatory appearing polyps were not removed.   RECOMMENDATIONS:  Proceed with proctocolectomy to cure the patient of his  chronic universal ulcerative proctocolitis.                                               Danise Edge, M.D.   MJ/MEDQ  D:  01/30/2002  T:  01/30/2002  Job:  [id number]   cc:   Sheppard Plumber. Earlene Plater, M.D.  Fax: [phone]

## 2011-01-07 LAB — LIPID PANEL
Cholesterol: 71
Cholesterol: 85
HDL: 14 — ABNORMAL LOW
HDL: 17 — ABNORMAL LOW
LDL Cholesterol: 42
LDL Cholesterol: 49
Total CHOL/HDL Ratio: 4
Total CHOL/HDL Ratio: 5.2
Triglycerides: 84
Triglycerides: 84
VLDL: 17

## 2011-01-07 LAB — BASIC METABOLIC PANEL
BUN: 11
BUN: 6
CO2: 25
CO2: 30
Calcium: 8.3 — ABNORMAL LOW
Calcium: 8.6
Calcium: 8.7
Calcium: 8.7
Creatinine, Ser: 0.61
Creatinine, Ser: 0.67
Creatinine, Ser: 0.71
Creatinine, Ser: 0.8
GFR calc Af Amer: >60
GFR calc Af Amer: >60
GFR calc Af Amer: >60
GFR calc non Af Amer: >60
GFR calc non Af Amer: >60
GFR calc non Af Amer: >60
Glucose, Bld: 112 — ABNORMAL HIGH
Glucose, Bld: 116 — ABNORMAL HIGH
Glucose, Bld: 130 — ABNORMAL HIGH
Glucose, Bld: 143 — ABNORMAL HIGH
Potassium: 3.9
Sodium: 138
Sodium: 138

## 2011-01-07 LAB — COMPREHENSIVE METABOLIC PANEL
ALT: 10
ALT: 17
ALT: 19
ALT: 31
AST: 15
AST: 15
AST: 16
Albumin: 2.4 — ABNORMAL LOW
Albumin: 2.4 — ABNORMAL LOW
Albumin: 2.5 — ABNORMAL LOW
Albumin: 2.7 — ABNORMAL LOW
Albumin: 2.9 — ABNORMAL LOW
Alkaline Phosphatase: 120 — ABNORMAL HIGH
Alkaline Phosphatase: 117
Alkaline Phosphatase: 143 — ABNORMAL HIGH
Alkaline Phosphatase: 143 — ABNORMAL HIGH
BUN: 11
BUN: <1 — ABNORMAL LOW
BUN: 10
BUN: 16
BUN: 9
CO2: 25
CO2: 30
Calcium: 8.2 — ABNORMAL LOW
Calcium: 8.7
Calcium: 8.8
Chloride: 100
Chloride: 103
Chloride: 106
Creatinine, Ser: 0.73
Creatinine, Ser: 0.81
Creatinine, Ser: 0.61
Creatinine, Ser: 0.68
GFR calc Af Amer: >60
GFR calc Af Amer: >60
GFR calc Af Amer: >60
GFR calc Af Amer: >60
GFR calc non Af Amer: >60
GFR calc non Af Amer: >60
Glucose, Bld: 121 — ABNORMAL HIGH
Glucose, Bld: 129 — ABNORMAL HIGH
Glucose, Bld: 140 — ABNORMAL HIGH
Glucose, Bld: 120 — ABNORMAL HIGH
Potassium: 4.1
Potassium: 3.7
Potassium: 3.9
Potassium: 4.2
Potassium: 4.3
Sodium: 138
Sodium: 140
Sodium: 137
Sodium: 138
Total Bilirubin: 0.4
Total Bilirubin: 0.4
Total Bilirubin: 0.4
Total Bilirubin: 0.5
Total Protein: 5.4 — ABNORMAL LOW
Total Protein: 5.7 — ABNORMAL LOW
Total Protein: 5.7 — ABNORMAL LOW
Total Protein: 5.9 — ABNORMAL LOW
Total Protein: 6.6

## 2011-01-07 LAB — APTT: aPTT: 33

## 2011-01-07 LAB — DIFFERENTIAL
Basophils Relative: 0
Basophils Relative: 0
Eosinophils Absolute: 0.2
Eosinophils Absolute: 0.2
Eosinophils Absolute: 0.2
Eosinophils Relative: 3
Eosinophils Relative: 2
Eosinophils Relative: 3
Lymphocytes Relative: 13
Lymphs Abs: 0.7
Lymphs Abs: 0.7
Lymphs Abs: 0.7
Lymphs Abs: 0.7
Monocytes Absolute: 0.5
Monocytes Absolute: 0.5
Monocytes Relative: 10
Monocytes Relative: 8
Monocytes Relative: 8
Monocytes Relative: 10
Neutro Abs: 4.7
Neutro Abs: 4.1
Neutrophils Relative %: 72
Neutrophils Relative %: 78 — ABNORMAL HIGH
Neutrophils Relative %: 78 — ABNORMAL HIGH

## 2011-01-07 LAB — CBC
HCT: 33 — ABNORMAL LOW
HCT: 32.1 — ABNORMAL LOW
HCT: 33.1 — ABNORMAL LOW
Hemoglobin: 10 — ABNORMAL LOW
Hemoglobin: 10.1 — ABNORMAL LOW
Hemoglobin: 11 — ABNORMAL LOW
MCHC: 33.1
MCHC: 33.3
MCHC: 33.3
MCHC: 33.4
MCV: 83.9
MCV: 84.6
Platelets: 340
Platelets: 286
Platelets: 300
RBC: 3.37 — ABNORMAL LOW
RBC: 3.75 — ABNORMAL LOW
RDW: 13.9
RDW: 14
RDW: 14.8
RDW: 15.5
RDW: 15.6 — ABNORMAL HIGH
WBC: 6
WBC: 5.4
WBC: 5.5
WBC: 7.4

## 2011-01-07 LAB — PROTIME-INR: INR: 1.1

## 2011-01-07 LAB — WOUND CULTURE

## 2011-01-07 LAB — PHOSPHORUS
Phosphorus: 3.6
Phosphorus: 4.3
Phosphorus: 4.4
Phosphorus: 3.1
Phosphorus: 4.9 — ABNORMAL HIGH

## 2011-01-07 LAB — PREALBUMIN
Prealbumin: 14.4 — ABNORMAL LOW
Prealbumin: 9.6 — ABNORMAL LOW
Prealbumin: 18.1

## 2011-01-07 LAB — MAGNESIUM
Magnesium: 2.1
Magnesium: 1.9

## 2011-01-07 LAB — TRIGLYCERIDES
Triglycerides: 70
Triglycerides: 93

## 2011-01-07 LAB — CHOLESTEROL, TOTAL: Cholesterol: 113

## 2011-01-08 LAB — PREALBUMIN: Prealbumin: 17.4 — ABNORMAL LOW

## 2011-10-27 ENCOUNTER — Other Ambulatory Visit: Payer: Self-pay | Admitting: Internal Medicine

## 2011-10-27 DIAGNOSIS — R1011 Right upper quadrant pain: Secondary | ICD-10-CM

## 2011-10-29 ENCOUNTER — Ambulatory Visit
Admission: RE | Admit: 2011-10-29 | Discharge: 2011-10-29 | Disposition: A | Discharge status: Home / Self Care | Payer: BC Managed Care – PPO | Source: Ambulatory Visit | Attending: Internal Medicine | Admitting: Internal Medicine

## 2011-10-29 DIAGNOSIS — R1011 Right upper quadrant pain: Secondary | ICD-10-CM

## 2011-12-24 ENCOUNTER — Other Ambulatory Visit: Payer: Self-pay | Admitting: Internal Medicine

## 2011-12-24 DIAGNOSIS — R1011 Right upper quadrant pain: Secondary | ICD-10-CM

## 2011-12-27 ENCOUNTER — Other Ambulatory Visit: Payer: Self-pay | Admitting: Internal Medicine

## 2011-12-27 ENCOUNTER — Other Ambulatory Visit: Payer: BC Managed Care – PPO

## 2011-12-27 DIAGNOSIS — R1011 Right upper quadrant pain: Secondary | ICD-10-CM

## 2011-12-27 DIAGNOSIS — Z85048 Personal history of other malignant neoplasm of rectum, rectosigmoid junction, and anus: Secondary | ICD-10-CM

## 2011-12-30 ENCOUNTER — Ambulatory Visit
Admission: RE | Admit: 2011-12-30 | Discharge: 2011-12-30 | Disposition: A | Discharge status: Home / Self Care | Payer: BC Managed Care – PPO | Source: Ambulatory Visit | Attending: Internal Medicine | Admitting: Internal Medicine

## 2011-12-30 DIAGNOSIS — R1011 Right upper quadrant pain: Secondary | ICD-10-CM

## 2011-12-30 DIAGNOSIS — Z85048 Personal history of other malignant neoplasm of rectum, rectosigmoid junction, and anus: Secondary | ICD-10-CM

## 2011-12-30 MED ORDER — GADOBENATE DIMEGLUMINE 529 MG/ML IV SOLN
15.0000 mL | Freq: Once | INTRAVENOUS | Status: AC | PRN
  Administered 2011-12-30: 15 mL via INTRAVENOUS

## 2012-02-14 ENCOUNTER — Other Ambulatory Visit: Payer: Self-pay | Admitting: Gastroenterology

## 2012-02-14 DIAGNOSIS — R109 Unspecified abdominal pain: Secondary | ICD-10-CM

## 2012-02-17 ENCOUNTER — Ambulatory Visit
Admission: RE | Admit: 2012-02-17 | Discharge: 2012-02-17 | Disposition: A | Discharge status: Home / Self Care | Payer: BC Managed Care – PPO | Source: Ambulatory Visit | Attending: Gastroenterology | Admitting: Gastroenterology

## 2012-02-17 DIAGNOSIS — R109 Unspecified abdominal pain: Secondary | ICD-10-CM

## 2012-02-17 MED ORDER — IOHEXOL 300 MG/ML  SOLN
80.0000 mL | Freq: Once | INTRAMUSCULAR | Status: AC | PRN
  Administered 2012-02-17: 80 mL via INTRAVENOUS

## 2012-03-23 ENCOUNTER — Encounter (INDEPENDENT_AMBULATORY_CARE_PROVIDER_SITE_OTHER): Payer: Self-pay

## 2012-04-03 ENCOUNTER — Ambulatory Visit (INDEPENDENT_AMBULATORY_CARE_PROVIDER_SITE_OTHER): Payer: BC Managed Care – PPO | Admitting: General Surgery

## 2012-04-03 ENCOUNTER — Encounter (INDEPENDENT_AMBULATORY_CARE_PROVIDER_SITE_OTHER): Payer: Self-pay | Admitting: General Surgery

## 2012-04-03 VITALS — BP 138/82 | HR 85 | Temp 98.8°F | Resp 18 | Ht 69.0 in | Wt 162.0 lb

## 2012-04-03 DIAGNOSIS — K828 Other specified diseases of gallbladder: Secondary | ICD-10-CM

## 2012-04-03 NOTE — Progress Notes (Signed)
Chief Complaint  Patient presents with  . New Evaluation    RUQ Abd pain    HISTORY:  Brandon [name] is a 56 y.o. male who presents to clinic with RUQ pain that is worse with meals.  He has had that pain for 9 months but it is getting worse.  He has a h/o crohn's disease but they have not found this to be the source of his pain.  An EGD was also negative for pathology.  He also has a pain in his LLQ that gets worse with eating and when he gets bowel obstructions.  Past Medical History  Diagnosis Date  . Biliary dyskinesia   . Bowel obstruction   . Cancer     Rectal/ rectal pouch  . Ulcerative colitis   . Allergy   . Depression   . Ileostomy in place   . ED (erectile dysfunction)   . Benign positional vertigo   . Prolactinoma   . Hypogonadism male        Past Surgical History  Procedure Date  . Colon surgery 2003    Colectomy with ileostomy  . Rectal surgery 2009    Resection of rectal pouch      Current Outpatient Prescriptions  Medication Sig Dispense Refill  . cabergoline (DOSTINEX) 0.5 MG tablet Take 0.25 mg by mouth 2 (two) times a week.      . clonazePAM (KLONOPIN) 0.5 MG tablet Take 0.25 mg by mouth 2 (two) times daily as needed.      . tadalafil (CIALIS) 10 MG tablet Take 10 mg by mouth daily as needed. Take 1/2 to 1 tablet once a day as needed         Allergies  Allergen Reactions  . Codeine Nausea And Vomiting  . Effexor (Venlafaxine Hcl)     Flat Feeling  . Imuran (Azathioprine) Nausea And Vomiting      Family History  Problem Relation Age of Onset  . Cancer Mother     ovarian      History   Social History  . Marital Status: Single    Spouse Name: N/A    Number of Children: N/A  . Years of Education: N/A   Social History Main Topics  . Smoking status: Never Smoker   . Smokeless tobacco: None  . Alcohol Use: No  . Drug Use: No  . Sexually Active:    Other Topics Concern  . None   Social History Narrative  . None       REVIEW OF  SYSTEMS - PERTINENT POSITIVES ONLY: Review of Systems - History obtained from the patient General ROS: negative for - chills or fever Hematological and Lymphatic ROS: negative for - bleeding problems or blood clots Respiratory ROS: no cough, shortness of breath, or wheezing Cardiovascular ROS: no chest pain or dyspnea on exertion Gastrointestinal ROS: positive for - abdominal pain negative for - change in bowel habits  EXAM: Filed Vitals:   04/03/12 1330  BP: 138/82  Pulse: 85  Temp: 98.8 F (37.1 C)  Resp: 18    General appearance: alert and cooperative Resp: clear to auscultation bilaterally Cardio: regular rate and rhythm GI: soft, non-tender; bowel sounds normal; no masses,  no organomegaly   LABORATORY RESULTS: Available labs are reviewed  LFT's WNL  RADIOLOGY RESULTS:   Images and reports are reviewed. RUQ US with no stones and no CBD enlargement CTE: no crohn's activity.  It appears he has a loop of bowel that is partially   obstructed by presumably scar in his pelvis HIDA: 22% EF  ASSESSMENT AND PLAN: Brandon [name] is a 56 y.o. M who was referred to me for gallbladder dyskinesia.  All other workup has been negative.  I think it is reasonable to remove his gallbladder.  This has about an 80% chance of relieving the pain. We will plan for cholecystectomy.  We will try to do an laparoscopic exploration of his abdomen at the same time, but I suspect this will be difficult due to his scarring.  We discussed his pelvic adhesions.  I think this is where his left sided pain is coming from.  I believe this would take a major pelvic surgery to relieve and would not recommend this unless he was completely obstructed.  We discussed the operative plan.  If we cannot do this laparoscopically, he would like us to do it open.  The anatomy & physiology of hepatobiliary & pancreatic function was discussed.  The pathophysiology of gallbladder dysfunction was discussed.  Natural history  risks without surgery was discussed.   I feel the risks of no intervention will lead to serious problems that outweigh the operative risks; therefore, I recommended cholecystectomy to remove the pathology.  I explained laparoscopic techniques with possible need for an open approach.  Probable cholangiogram to evaluate the bilary tract was explained as well.    Risks such as bleeding, infection, abscess, leak, injury to other organs, need for further treatment, heart attack, death, and other risks were discussed.  I noted a good likelihood this will help address the problem.  Possibility that this will not correct all abdominal symptoms was explained.  Goals of post-operative recovery were discussed as well.  We will work to minimize complications.  An educational handout further explaining the pathology and treatment options was given as well.  Questions were answered.  The patient expresses understanding & wishes to proceed with surgery. Tayvia Faughnan C Johnita Palleschi, MD Colon and Rectal Surgery / General Surgery [facility]      Visit Diagnoses: 1. Nonfunctioning gallbladder     Primary Care Physician: GATES,ROBERT NEVILL, MD     

## 2012-04-03 NOTE — Patient Instructions (Addendum)
We will schedule you for cholecystectomy

## 2012-04-07 ENCOUNTER — Encounter (INDEPENDENT_AMBULATORY_CARE_PROVIDER_SITE_OTHER): Payer: Self-pay

## 2012-04-13 ENCOUNTER — Encounter (HOSPITAL_COMMUNITY): Payer: Self-pay | Admitting: Pharmacy Technician

## 2012-04-13 NOTE — Patient Instructions (Addendum)
20 VIC ESCO  04/13/2012   Your procedure is scheduled on:  04/26/12   [facility]  Report to [facility] at   0815    AM.  Call this number if you have problems the morning of surgery: [phone]       Remember:   Do not eat food  Or drink :After Midnight.  Tuesday NIGHT   Take these medicines the morning of surgery with A SIP OF WATER:   Clonazepam if needed   .  Contacts, dentures or partial plates can not be worn to surgery  Leave suitcase in the car. After surgery it may be brought to your room.  For patients admitted to the hospital, checkout time is 11:00 AM day of  discharge.             SPECIAL INSTRUCTIONS- SEE [place] PREPARING FOR SURGERY INSTRUCTION SHEET-     DO NOT WEAR JEWELRY, LOTIONS, POWDERS, OR PERFUMES.  WOMEN-- DO NOT SHAVE LEGS OR UNDERARMS FOR 12 HOURS BEFORE SHOWERS. MEN MAY SHAVE FACE.  Patients discharged the day of surgery will not be allowed to drive home. IF going home the day of surgery, you must have a driver and someone to stay with you for the first 24 hours  Name and phone number of your driver:      father                                                                  Please read over the following fact sheets that you were given: MRSA Information, Incentive Spirometry Sheet, Blood Transfusion Sheet  Information                                                                                   Aayla Marrocco  PST [phone]  [phone]                 FAILURE TO FOLLOW THESE INSTRUCTIONS MAY RESULT IN  CANCELLATION   OF YOUR SURGERY                                                  Patient Signature _____________________________

## 2012-04-14 ENCOUNTER — Encounter (HOSPITAL_COMMUNITY)
Admission: RE | Admit: 2012-04-14 | Discharge: 2012-04-14 | Disposition: A | Discharge status: Home / Self Care | Payer: BC Managed Care – PPO | Source: Ambulatory Visit | Attending: General Surgery | Admitting: General Surgery

## 2012-04-14 ENCOUNTER — Encounter (HOSPITAL_COMMUNITY): Payer: Self-pay

## 2012-04-14 HISTORY — DX: Personal history of other medical treatment: Z92.89

## 2012-04-14 HISTORY — DX: Neoplasm of unspecified behavior of endocrine glands and other parts of nervous system: D49.7

## 2012-04-14 LAB — CBC
HCT: 39.8 % (ref 39.0–52.0)
Hemoglobin: 13.6 g/dL (ref 13.0–17.0)
MCV: 93.6 fL (ref 78.0–100.0)
RDW: 11.9 % (ref 11.5–15.5)
WBC: 5.8 10*3/uL (ref 4.0–10.5)

## 2012-04-14 LAB — BASIC METABOLIC PANEL
BUN: 15 mg/dL (ref 6–23)
Chloride: 103 mEq/L (ref 96–112)
Creatinine, Ser: 0.77 mg/dL (ref 0.50–1.35)
GFR calc Af Amer: >90 mL/min (ref >90)
Glucose, Bld: 100 mg/dL — ABNORMAL HIGH (ref 70–99)
Potassium: 4 mEq/L (ref 3.5–5.1)

## 2012-04-14 LAB — SURGICAL PCR SCREEN
MRSA, PCR: NEGATIVE
Staphylococcus aureus: POSITIVE — AB

## 2012-04-14 NOTE — Progress Notes (Signed)
Dr Maisie Fus-  Need pre op orders please.   Was seen in PST today and labs per anesthesia were done  Thanks

## 2012-04-14 NOTE — Progress Notes (Signed)
Ct abdomen 11/13  Epic.  Attempted to request old EKG from [facility] medical records in [place]- have been transferred to three different numbers with mail box being full and cannot leave message

## 2012-04-17 ENCOUNTER — Other Ambulatory Visit (INDEPENDENT_AMBULATORY_CARE_PROVIDER_SITE_OTHER): Payer: Self-pay | Admitting: General Surgery

## 2012-04-17 NOTE — Progress Notes (Signed)
EKG reviewed by Dr Okey Dupre-  OK for OR

## 2012-04-19 ENCOUNTER — Encounter (INDEPENDENT_AMBULATORY_CARE_PROVIDER_SITE_OTHER): Payer: Self-pay

## 2012-04-22 ENCOUNTER — Encounter (INDEPENDENT_AMBULATORY_CARE_PROVIDER_SITE_OTHER): Payer: Self-pay | Admitting: General Surgery

## 2012-04-24 ENCOUNTER — Encounter (INDEPENDENT_AMBULATORY_CARE_PROVIDER_SITE_OTHER): Payer: Self-pay

## 2012-04-26 ENCOUNTER — Encounter (HOSPITAL_COMMUNITY): Payer: Self-pay

## 2012-04-26 ENCOUNTER — Encounter (HOSPITAL_COMMUNITY): Payer: Self-pay | Admitting: Anesthesiology

## 2012-04-26 ENCOUNTER — Inpatient Hospital Stay (HOSPITAL_COMMUNITY)
Admission: RE | Admit: 2012-04-26 | Discharge: 2012-04-28 | DRG: 493 | Disposition: A | Discharge status: Home / Self Care | Payer: BC Managed Care – PPO | Source: Ambulatory Visit | Attending: General Surgery | Admitting: General Surgery

## 2012-04-26 ENCOUNTER — Ambulatory Visit (HOSPITAL_COMMUNITY): Payer: BC Managed Care – PPO | Admitting: Anesthesiology

## 2012-04-26 ENCOUNTER — Encounter (HOSPITAL_COMMUNITY)
Admission: RE | Disposition: A | Discharge status: Home / Self Care | Payer: Self-pay | Source: Ambulatory Visit | Attending: General Surgery

## 2012-04-26 ENCOUNTER — Ambulatory Visit (HOSPITAL_COMMUNITY): Payer: BC Managed Care – PPO

## 2012-04-26 ENCOUNTER — Encounter (HOSPITAL_COMMUNITY): Payer: Self-pay | Admitting: *Deleted

## 2012-04-26 DIAGNOSIS — Z888 Allergy status to other drugs, medicaments and biological substances status: Secondary | ICD-10-CM

## 2012-04-26 DIAGNOSIS — K5090 Crohn's disease, unspecified, without complications: Secondary | ICD-10-CM | POA: Diagnosis present

## 2012-04-26 DIAGNOSIS — I509 Heart failure, unspecified: Secondary | ICD-10-CM | POA: Diagnosis present

## 2012-04-26 DIAGNOSIS — F3289 Other specified depressive episodes: Secondary | ICD-10-CM | POA: Diagnosis present

## 2012-04-26 DIAGNOSIS — Z9049 Acquired absence of other specified parts of digestive tract: Secondary | ICD-10-CM

## 2012-04-26 DIAGNOSIS — Z932 Ileostomy status: Secondary | ICD-10-CM

## 2012-04-26 DIAGNOSIS — Z8711 Personal history of peptic ulcer disease: Secondary | ICD-10-CM

## 2012-04-26 DIAGNOSIS — K660 Peritoneal adhesions (postprocedural) (postinfection): Secondary | ICD-10-CM | POA: Diagnosis present

## 2012-04-26 DIAGNOSIS — K8010 Calculus of gallbladder with chronic cholecystitis without obstruction: Secondary | ICD-10-CM

## 2012-04-26 DIAGNOSIS — F329 Major depressive disorder, single episode, unspecified: Secondary | ICD-10-CM | POA: Diagnosis present

## 2012-04-26 DIAGNOSIS — Z79899 Other long term (current) drug therapy: Secondary | ICD-10-CM

## 2012-04-26 DIAGNOSIS — K828 Other specified diseases of gallbladder: Principal | ICD-10-CM | POA: Diagnosis present

## 2012-04-26 DIAGNOSIS — Z885 Allergy status to narcotic agent status: Secondary | ICD-10-CM

## 2012-04-26 DIAGNOSIS — R112 Nausea with vomiting, unspecified: Secondary | ICD-10-CM | POA: Diagnosis not present

## 2012-04-26 DIAGNOSIS — Z85048 Personal history of other malignant neoplasm of rectum, rectosigmoid junction, and anus: Secondary | ICD-10-CM

## 2012-04-26 HISTORY — PX: CHOLECYSTECTOMY: SHX55

## 2012-04-26 SURGERY — LAPAROSCOPIC CHOLECYSTECTOMY
Anesthesia: General | Site: Abdomen | Wound class: Contaminated

## 2012-04-26 MED ORDER — OXYCODONE-ACETAMINOPHEN 5-325 MG PO TABS
1.0000 | ORAL_TABLET | ORAL | Status: DC | PRN
  Filled 2012-04-26: qty 1

## 2012-04-26 MED ORDER — PROPOFOL 10 MG/ML IV BOLUS
INTRAVENOUS | Status: DC | PRN
  Administered 2012-04-26: 160 mg via INTRAVENOUS

## 2012-04-26 MED ORDER — ACETAMINOPHEN 10 MG/ML IV SOLN: 1000.0000 mg | Freq: Once | INTRAVENOUS | Status: DC | PRN

## 2012-04-26 MED ORDER — LACTATED RINGERS IR SOLN
Status: DC | PRN
  Administered 2012-04-26: 1

## 2012-04-26 MED ORDER — DEXTROSE 5 % IV SOLN
2.0000 g | INTRAVENOUS | Status: AC
  Administered 2012-04-26: 2 g via INTRAVENOUS

## 2012-04-26 MED ORDER — LIDOCAINE HCL (CARDIAC) 20 MG/ML IV SOLN
INTRAVENOUS | Status: DC | PRN
  Administered 2012-04-26: 80 mg via INTRAVENOUS

## 2012-04-26 MED ORDER — CABERGOLINE 0.5 MG PO TABS: 0.2500 mg | ORAL_TABLET | ORAL | Status: DC

## 2012-04-26 MED ORDER — ROCURONIUM BROMIDE 100 MG/10ML IV SOLN
INTRAVENOUS | Status: DC | PRN
  Administered 2012-04-26: 40 mg via INTRAVENOUS
  Administered 2012-04-26 (×2): 10 mg via INTRAVENOUS

## 2012-04-26 MED ORDER — ONDANSETRON HCL 4 MG/2ML IJ SOLN
4.0000 mg | Freq: Four times a day (QID) | INTRAMUSCULAR | Status: DC | PRN
  Administered 2012-04-26 – 2012-04-28 (×6): 4 mg via INTRAVENOUS
  Filled 2012-04-26 (×6): qty 2

## 2012-04-26 MED ORDER — MEPERIDINE HCL 50 MG/ML IJ SOLN: 6.2500 mg | INTRAMUSCULAR | Status: DC | PRN

## 2012-04-26 MED ORDER — EPHEDRINE SULFATE 50 MG/ML IJ SOLN
INTRAMUSCULAR | Status: DC | PRN
  Administered 2012-04-26 (×2): 5 mg via INTRAVENOUS
  Administered 2012-04-26: 10 mg via INTRAVENOUS

## 2012-04-26 MED ORDER — ACETAMINOPHEN 325 MG PO TABS
650.0000 mg | ORAL_TABLET | Freq: Four times a day (QID) | ORAL | Status: DC | PRN
  Administered 2012-04-26: 650 mg via ORAL
  Filled 2012-04-26 (×2): qty 2

## 2012-04-26 MED ORDER — ONDANSETRON HCL 4 MG/2ML IJ SOLN
INTRAMUSCULAR | Status: DC | PRN
  Administered 2012-04-26: 4 mg via INTRAVENOUS

## 2012-04-26 MED ORDER — IOHEXOL 300 MG/ML  SOLN
INTRAMUSCULAR | Status: DC | PRN
  Administered 2012-04-26: 15 mL via INTRAVENOUS

## 2012-04-26 MED ORDER — CEFAZOLIN SODIUM-DEXTROSE 2-3 GM-% IV SOLR
INTRAVENOUS | Status: AC
  Filled 2012-04-26: qty 50

## 2012-04-26 MED ORDER — OXYCODONE HCL 5 MG PO TABS: 5.0000 mg | ORAL_TABLET | Freq: Once | ORAL | Status: DC | PRN

## 2012-04-26 MED ORDER — HYDROMORPHONE HCL PF 1 MG/ML IJ SOLN
INTRAMUSCULAR | Status: AC
  Filled 2012-04-26: qty 1

## 2012-04-26 MED ORDER — GLYCOPYRROLATE 0.2 MG/ML IJ SOLN
INTRAMUSCULAR | Status: DC | PRN
  Administered 2012-04-26: .6 mg via INTRAVENOUS

## 2012-04-26 MED ORDER — CEFOXITIN SODIUM-DEXTROSE 1-4 GM-% IV SOLR (PREMIX)
INTRAVENOUS | Status: AC
  Filled 2012-04-26: qty 100

## 2012-04-26 MED ORDER — FENTANYL CITRATE 0.05 MG/ML IJ SOLN
INTRAMUSCULAR | Status: DC | PRN
  Administered 2012-04-26 (×3): 50 ug via INTRAVENOUS

## 2012-04-26 MED ORDER — PROMETHAZINE HCL 25 MG/ML IJ SOLN: 6.2500 mg | INTRAMUSCULAR | Status: DC | PRN

## 2012-04-26 MED ORDER — ACETAMINOPHEN 10 MG/ML IV SOLN
INTRAVENOUS | Status: AC
  Filled 2012-04-26: qty 100

## 2012-04-26 MED ORDER — ONDANSETRON HCL 4 MG PO TABS
4.0000 mg | ORAL_TABLET | Freq: Four times a day (QID) | ORAL | Status: DC | PRN
  Filled 2012-04-26: qty 1

## 2012-04-26 MED ORDER — NEOSTIGMINE METHYLSULFATE 1 MG/ML IJ SOLN
INTRAMUSCULAR | Status: DC | PRN
  Administered 2012-04-26: 4 mg via INTRAVENOUS

## 2012-04-26 MED ORDER — KCL IN DEXTROSE-NACL 20-5-0.45 MEQ/L-%-% IV SOLN
INTRAVENOUS | Status: DC
  Administered 2012-04-26: 17:00:00 via INTRAVENOUS
  Filled 2012-04-26 (×2): qty 1000

## 2012-04-26 MED ORDER — DEXAMETHASONE SODIUM PHOSPHATE 10 MG/ML IJ SOLN
INTRAMUSCULAR | Status: DC | PRN
  Administered 2012-04-26: 10 mg via INTRAVENOUS

## 2012-04-26 MED ORDER — MIDAZOLAM HCL 5 MG/5ML IJ SOLN
INTRAMUSCULAR | Status: DC | PRN
  Administered 2012-04-26: 2 mg via INTRAVENOUS

## 2012-04-26 MED ORDER — OXYCODONE HCL 5 MG/5ML PO SOLN
5.0000 mg | Freq: Once | ORAL | Status: DC | PRN
  Filled 2012-04-26: qty 5

## 2012-04-26 MED ORDER — CLONAZEPAM 0.5 MG PO TABS
0.2500 mg | ORAL_TABLET | Freq: Two times a day (BID) | ORAL | Status: DC | PRN
  Administered 2012-04-27 – 2012-04-28 (×2): 0.25 mg via ORAL
  Filled 2012-04-26 (×2): qty 1

## 2012-04-26 MED ORDER — BUPIVACAINE-EPINEPHRINE PF 0.25-1:200000 % IJ SOLN
INTRAMUSCULAR | Status: AC
  Filled 2012-04-26: qty 30

## 2012-04-26 MED ORDER — LACTATED RINGERS IV SOLN
INTRAVENOUS | Status: DC
  Administered 2012-04-26: 1000 mL via INTRAVENOUS

## 2012-04-26 MED ORDER — 0.9 % SODIUM CHLORIDE (POUR BTL) OPTIME
TOPICAL | Status: DC | PRN
  Administered 2012-04-26: 1000 mL

## 2012-04-26 MED ORDER — ENOXAPARIN SODIUM 40 MG/0.4ML ~~LOC~~ SOLN
40.0000 mg | SUBCUTANEOUS | Status: DC
  Administered 2012-04-27 – 2012-04-28 (×2): 40 mg via SUBCUTANEOUS
  Filled 2012-04-26 (×3): qty 0.4

## 2012-04-26 MED ORDER — HYDROMORPHONE HCL PF 1 MG/ML IJ SOLN
0.2500 mg | INTRAMUSCULAR | Status: DC | PRN
  Administered 2012-04-26 (×4): 0.5 mg via INTRAVENOUS

## 2012-04-26 MED ORDER — BUPIVACAINE-EPINEPHRINE 0.25% -1:200000 IJ SOLN
INTRAMUSCULAR | Status: DC | PRN
  Administered 2012-04-26: 7 mL

## 2012-04-26 MED ORDER — IOHEXOL 300 MG/ML  SOLN
INTRAMUSCULAR | Status: AC
  Filled 2012-04-26: qty 1

## 2012-04-26 SURGICAL SUPPLY — 52 items
APPLIER CLIP 5 13 M/L LIGAMAX5 (MISCELLANEOUS) ×2
APPLIER CLIP ROT 10 11.4 M/L (STAPLE)
BANDAGE ADHESIVE 1X3 (GAUZE/BANDAGES/DRESSINGS) IMPLANT
BENZOIN TINCTURE PRP APPL 2/3 (GAUZE/BANDAGES/DRESSINGS) IMPLANT
CANISTER SUCTION 2500CC (MISCELLANEOUS) ×2 IMPLANT
CATH REDDICK CHOLANGI 4FR 50CM (CATHETERS) ×2 IMPLANT
CHLORAPREP W/TINT 26ML (MISCELLANEOUS) ×2 IMPLANT
CLIP APPLIE 5 13 M/L LIGAMAX5 (MISCELLANEOUS) ×1 IMPLANT
CLIP APPLIE ROT 10 11.4 M/L (STAPLE) IMPLANT
CLOTH BEACON ORANGE TIMEOUT ST (SAFETY) ×2 IMPLANT
COVER MAYO STAND STRL (DRAPES) ×2 IMPLANT
COVER SURGICAL LIGHT HANDLE (MISCELLANEOUS) IMPLANT
DECANTER SPIKE VIAL GLASS SM (MISCELLANEOUS) ×2 IMPLANT
DERMABOND ADVANCED (GAUZE/BANDAGES/DRESSINGS)
DERMABOND ADVANCED .7 DNX12 (GAUZE/BANDAGES/DRESSINGS) IMPLANT
DEVICE TROCAR PUNCTURE CLOSURE (ENDOMECHANICALS) IMPLANT
DRAPE C-ARM 42X72 X-RAY (DRAPES) ×2 IMPLANT
DRAPE LAPAROSCOPIC ABDOMINAL (DRAPES) ×2 IMPLANT
DRAPE UTILITY XL STRL (DRAPES) ×2 IMPLANT
DRSG TEGADERM 2-3/8X2-3/4 SM (GAUZE/BANDAGES/DRESSINGS) IMPLANT
DRSG TEGADERM 8X12 (GAUZE/BANDAGES/DRESSINGS) ×2 IMPLANT
ELECT REM PT RETURN 9FT ADLT (ELECTROSURGICAL) ×2
ELECTRODE REM PT RTRN 9FT ADLT (ELECTROSURGICAL) ×1 IMPLANT
GLOVE BIO SURGEON STRL SZ7.5 (GLOVE) IMPLANT
GLOVE BIOGEL M STRL SZ7.5 (GLOVE) IMPLANT
GLOVE BIOGEL PI IND STRL 7.0 (GLOVE) ×1 IMPLANT
GLOVE BIOGEL PI INDICATOR 7.0 (GLOVE) ×1
GLOVE INDICATOR 8.0 STRL GRN (GLOVE) IMPLANT
GOWN PREVENTION PLUS XXLARGE (GOWN DISPOSABLE) ×2 IMPLANT
GOWN STRL NON-REIN LRG LVL3 (GOWN DISPOSABLE) ×2 IMPLANT
GOWN STRL REIN XL XLG (GOWN DISPOSABLE) ×4 IMPLANT
KIT BASIN OR (CUSTOM PROCEDURE TRAY) ×2 IMPLANT
NS IRRIG 1000ML POUR BTL (IV SOLUTION) ×2 IMPLANT
PENCIL BUTTON HOLSTER BLD 10FT (ELECTRODE) ×2 IMPLANT
POUCH SPECIMEN RETRIEVAL 10MM (ENDOMECHANICALS) ×2 IMPLANT
SET CHOLANGIOGRAPH MIX (MISCELLANEOUS) IMPLANT
SET IRRIG TUBING LAPAROSCOPIC (IRRIGATION / IRRIGATOR) ×2 IMPLANT
SHEARS HARMONIC 9CM CVD (BLADE) ×2 IMPLANT
SOLUTION ANTI FOG 6CC (MISCELLANEOUS) ×2 IMPLANT
SPONGE LAP 18X18 X RAY DECT (DISPOSABLE) ×2 IMPLANT
STRIP CLOSURE SKIN 1/2X4 (GAUZE/BANDAGES/DRESSINGS) IMPLANT
SUT MNCRL AB 4-0 PS2 18 (SUTURE) ×2 IMPLANT
SUT NOVA NAB DX-16 0-1 5-0 T12 (SUTURE) ×4 IMPLANT
SUT SILK 3 0 SH CR/8 (SUTURE) ×4 IMPLANT
SUT VIC AB 4-0 PS2 27 (SUTURE) ×2 IMPLANT
SUT VICRYL 0 UR6 27IN ABS (SUTURE) IMPLANT
TOWEL OR 17X26 10 PK STRL BLUE (TOWEL DISPOSABLE) ×4 IMPLANT
TRAY LAP CHOLE (CUSTOM PROCEDURE TRAY) ×2 IMPLANT
TROCAR BLADELESS OPT 5 75 (ENDOMECHANICALS) ×8 IMPLANT
TROCAR XCEL BLUNT TIP 100MML (ENDOMECHANICALS) IMPLANT
TROCAR XCEL NON-BLD 11X100MML (ENDOMECHANICALS) ×2 IMPLANT
TUBING INSUFFLATION 10FT LAP (TUBING) ×2 IMPLANT

## 2012-04-26 NOTE — Progress Notes (Signed)
Dr. Maisie Fus in and talked with patient- made aware of patient's I.V. Intake in O.R.- Patient does not feel like he needs to void at this time- abdomen soft- Dr. Maisie Fus aware

## 2012-04-26 NOTE — Interval H&P Note (Signed)
History and Physical Interval Note:  04/26/2012 9:49 AM  Brandon [name]  has presented today for surgery, with the diagnosis of gallbladder dyskinesia  The various methods of treatment have been discussed with the patient and family. After consideration of risks, benefits and other options for treatment, the patient has consented to  Procedure(s) (LRB) with comments: LAPAROSCOPIC CHOLECYSTECTOMY (N/A) as a surgical intervention .  The patient's history has been reviewed, patient examined, no change in status, stable for surgery.  I have reviewed the patient's chart and labs.  Questions were answered to the patient's satisfaction.  He understands that the surgery may be more difficult than a normal person due to his adhesions and that there is a slightly higher chance of small bowel injury than the average cholecystectomy.     Vanita Panda, MD  Colorectal and General Surgery [facility]

## 2012-04-26 NOTE — Anesthesia Preprocedure Evaluation (Addendum)
Anesthesia Evaluation  Patient identified by MRN, date of birth, ID band Patient awake    Reviewed: Allergy & Precautions, H&P , NPO status , Patient's Chart, lab work & pertinent test results  Airway Mallampati: II TM Distance: >3 FB Neck ROM: Full    Dental  (+) Dental Advisory Given and Teeth Intact   Pulmonary neg pulmonary ROS,  breath sounds clear to auscultation  Pulmonary exam normal       Cardiovascular +CHF - CAD and - Past MI Rhythm:Regular Rate:Normal     Neuro/Psych PSYCHIATRIC DISORDERS Depression negative neurological ROS     GI/Hepatic PUD,   Endo/Other  negative endocrine ROS  Renal/GU negative Renal ROS     Musculoskeletal negative musculoskeletal ROS (+)   Abdominal   Peds  Hematology negative hematology ROS (+)   Anesthesia Other Findings   Reproductive/Obstetrics                         Anesthesia Physical Anesthesia Plan  ASA: II  Anesthesia Plan: General   Post-op Pain Management:    Induction: Intravenous  Airway Management Planned: Oral ETT  Additional Equipment:   Intra-op Plan:   Post-operative Plan: Extubation in OR  Informed Consent: I have reviewed the patients History and Physical, chart, labs and discussed the procedure including the risks, benefits and alternatives for the proposed anesthesia with the patient or authorized representative who has indicated his/her understanding and acceptance.   Dental advisory given  Plan Discussed with: CRNA  Anesthesia Plan Comments:         Anesthesia Quick Evaluation

## 2012-04-26 NOTE — Anesthesia Postprocedure Evaluation (Signed)
Anesthesia Post Note  Patient: Brandon [name]  Procedure(s) Performed: Procedure(s) (LRB): LAPAROSCOPIC CHOLECYSTECTOMY (N/A)  Anesthesia type: General  Patient location: PACU  Post pain: Pain level controlled  Post assessment: Post-op Vital signs reviewed  Last Vitals: BP 126/61  Pulse 94  Temp 36.6 C (Oral)  Resp 19  SpO2 100%  Post vital signs: Reviewed  Level of consciousness: sedated  Complications: No apparent anesthesia complications

## 2012-04-26 NOTE — Op Note (Signed)
04/26/2012  1:34 PM  PATIENT:  Brandon [name]  57 y.o. male  Patient Care Team: Marden Noble, MD as PCP - General (Internal Medicine) Charolett Bumpers, MD as Referring Physician (Gastroenterology)  PRE-OPERATIVE DIAGNOSIS:  gallbladder dyskinesia  POST-OPERATIVE DIAGNOSIS:  gallbladder dyskinesia  PROCEDURE:  Procedure(s): LAPAROSCOPIC CHOLECYSTECTOMY Laparoscopic lysis of adhesions  SURGEON:  Surgeon(s): Romie Levee, MD  ASSISTANT: Streck   ANESTHESIA:   local and general  EBL:  Total I/O In: 2400 [I.V.:2400] Out: 35 [Blood:35]  Delay start of Pharmacological VTE agent (>24hrs) due to surgical blood loss or risk of bleeding:  no  DRAINS: none   SPECIMEN:  Source of Specimen:  Gallbladder  DISPOSITION OF SPECIMEN:  PATHOLOGY  COUNTS:  YES  PLAN OF CARE: Admit for overnight observation  PATIENT DISPOSITION:  PACU - hemodynamically stable.  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 He was evaluated for right upper quadrant pain and it was felt that his pain could possibly be due to biliary dyskinesia. He had a HIDA scan which showed decreased gallbladder function. The risk and benefits of this procedure were explained to the patient prior the OR and consent was signed and placed on chart. He understood that there was a possibility of doing an open surgery due to extensive adhesions. He also understood that his right upper quadrant pain may not get better with the surgery.  OR FINDINGS: densely adherent small bowel to the anterior abdominal wall and midline. Distended gallbladder  DESCRIPTION: the patient was identified in the preoperative holding area and taken to the OR where he was placed on operating room table. General anesthesia was smoothly induced.  A Tegaderm was placed over the patient's ostomy at to allow for prepping.  The patient's abdomen was then prepped and draped in usual  sterile fashion.  Surgical timeout was performed indicating the correct patient, procedure, positioning and preoperative antibiotics. SCDs were also had been placed prior to the initiation of anesthesia. After this was completed a 5 mm incision was made in the patient's left upper quadrant 1 fingerbreadth below the costal border. A Optiview port was inserted into the patient's left upper quadrant.  The abdomen was insufflated to approximately 15 mm mercury. Upon evaluation of the abdomen there was no injury noted on entry.  There were multiple loops of small bowel that were adherent to the midline wound. I began by taking down the omental adhesions to the abdominal wall after placing a 5 mm port in the left lower quadrant. This was done under strict visualization. I then continued to mobilize the upper midline until the liver was identified. Blunt dissection was carried over to the falciform ligament. I was able to carefully take down loops of small bowel from the abdominal wall using mostly sharp dissection. The omentum was densely adhered to the falciform ligament. This was taken down with a harmonic scalpel. After this was completed I was able to visualize the gallbladder. I continued to mobilize the upper abdominal contents until I could identify the right upper quadrant abdominal wall.  There were still several loops of small bowel that were adherent to the abdominal wall in the right upper quadrant precluding any open technique to make the procedure more simple.  I was able to place a 5 mm port subcostally in the right upper quadrant. This was used to retract the gallbladder cephalad. A third 5 mm port was placed in the patient's  mid left abdomen.  I then was able to dissect out the gallbladder infundibulum and identify the cystic duct and cystic artery. The cystic artery was clipped using 2 5 mm clips proximally and one 5 mm clip distally. The artery was then transected with scissors. I then skeletonized the  cystic duct. There was a good critical view. I placed a 5 mm clip on the gallbladder infundibulum. A Reddick catheter was inserted into the patient's abdomen through an Angiocath. This was placed into the cystic duct and the balloon was inflated. A cholangiogram was then performed using diluted Omnipaque dye. Identified dye coming from a cystic duct stump up the common bile duct and branching into secondary biliary radicles. I also identified dye freely passing into the duodenum with no filling defects throughout the common bile duct. This was consistent with a normal cholangiogram. The catheter was then removed and the 5 mm clip applier was brought back into the abdomen. 2 clips were placed proximally on the cystic duct and the cystic duct was transected with scissors. I then mobilized the gallbladder off of the gallbladder fossa using the Bovie electrocautery. Hemostasis was achieved at the liver bed fossa using Bovie electrocautery. After this was completed the gallbladder was placed in the right upper quadrant. There was a portion of small bowel adherent to the entire upper midline portion of the abdominal wall. It was decided to make a small vertical midline incision to remove the gallbladder and to take down this portion of small bowel. An incision was made in the skin using a 15 blade scalpel. Dissection was carried down through subcutaneous tissues to the level of the fascia using Bovie electrocautery. The abdomen was entered. The small bowel was bluntly mobilized away from midline. Metzenbaum scissors were used to take down the remaining adhesions to the abdominal wall. The portion of small bowel was brought out of the abdomen evaluated. There was a serosal tear which was oversewn using 3-0 silk sutures in an interrupted fashion. After this was completed the small bowel was placed back into the abdomen. The gallbladder was brought out through the midline incision. The midline incision was then closed closed  with 1-0 Novafil sutures in interrupted fashion.  The skin of all incisions was closed using running 4-0 Vicryl sutures in a subcuticular fashion. Dermabond was used over this. The patient was then awakened from anesthesia and sent to the postanesthesia care unit in stable condition. All counts were correct per operating room staff.

## 2012-04-26 NOTE — H&P (View-Only) (Signed)
Chief Complaint  Patient presents with  . New Evaluation    RUQ Abd pain    HISTORY:  Brandon [name] is a 57 y.o. male who presents to clinic with RUQ pain that is worse with meals.  He has had that pain for 9 months but it is getting worse.  He has a h/o crohn's disease but they have not found this to be the source of his pain.  An EGD was also negative for pathology.  He also has a pain in his LLQ that gets worse with eating and when he gets bowel obstructions.  Past Medical History  Diagnosis Date  . Biliary dyskinesia   . Bowel obstruction   . Cancer     Rectal/ rectal pouch  . Ulcerative colitis   . Allergy   . Depression   . Ileostomy in place   . ED (erectile dysfunction)   . Benign positional vertigo   . Prolactinoma   . Hypogonadism male        Past Surgical History  Procedure Date  . Colon surgery 2003    Colectomy with ileostomy  . Rectal surgery 2009    Resection of rectal pouch      Current Outpatient Prescriptions  Medication Sig Dispense Refill  . cabergoline (DOSTINEX) 0.5 MG tablet Take 0.25 mg by mouth 2 (two) times a week.      . clonazePAM (KLONOPIN) 0.5 MG tablet Take 0.25 mg by mouth 2 (two) times daily as needed.      . tadalafil (CIALIS) 10 MG tablet Take 10 mg by mouth daily as needed. Take 1/2 to 1 tablet once a day as needed         Allergies  Allergen Reactions  . Codeine Nausea And Vomiting  . Effexor (Venlafaxine Hcl)     Flat Feeling  . Imuran (Azathioprine) Nausea And Vomiting      Family History  Problem Relation Age of Onset  . Cancer Mother     ovarian      History   Social History  . Marital Status: Single    Spouse Name: N/A    Number of Children: N/A  . Years of Education: N/A   Social History Main Topics  . Smoking status: Never Smoker   . Smokeless tobacco: None  . Alcohol Use: No  . Drug Use: No  . Sexually Active:    Other Topics Concern  . None   Social History Narrative  . None       REVIEW OF  SYSTEMS - PERTINENT POSITIVES ONLY: Review of Systems - History obtained from the patient General ROS: negative for - chills or fever Hematological and Lymphatic ROS: negative for - bleeding problems or blood clots Respiratory ROS: no cough, shortness of breath, or wheezing Cardiovascular ROS: no chest pain or dyspnea on exertion Gastrointestinal ROS: positive for - abdominal pain negative for - change in bowel habits  EXAM: Filed Vitals:   04/03/12 1330  BP: 138/82  Pulse: 85  Temp: 98.8 F (37.1 C)  Resp: 18    General appearance: alert and cooperative Resp: clear to auscultation bilaterally Cardio: regular rate and rhythm GI: soft, non-tender; bowel sounds normal; no masses,  no organomegaly   LABORATORY RESULTS: Available labs are reviewed  LFT's WNL  RADIOLOGY RESULTS:   Images and reports are reviewed. RUQ [place] with no stones and no CBD enlargement CTE: no crohn's activity.  It appears he has a loop of bowel that is partially  obstructed by presumably scar in his pelvis HIDA: 22% EF  ASSESSMENT AND PLAN: Brandon [name] is a 57 y.o. M who was referred to me for gallbladder dyskinesia.  All other workup has been negative.  I think it is reasonable to remove his gallbladder.  This has about an 80% chance of relieving the pain. We will plan for cholecystectomy.  We will try to do an laparoscopic exploration of his abdomen at the same time, but I suspect this will be difficult due to his scarring.  We discussed his pelvic adhesions.  I think this is where his left sided pain is coming from.  I believe this would take a major pelvic surgery to relieve and would not recommend this unless he was completely obstructed.  We discussed the operative plan.  If we cannot do this laparoscopically, he would like [place] to do it open.  The anatomy & physiology of hepatobiliary & pancreatic function was discussed.  The pathophysiology of gallbladder dysfunction was discussed.  Natural history  risks without surgery was discussed.   I feel the risks of no intervention will lead to serious problems that outweigh the operative risks; therefore, I recommended cholecystectomy to remove the pathology.  I explained laparoscopic techniques with possible need for an open approach.  Probable cholangiogram to evaluate the bilary tract was explained as well.    Risks such as bleeding, infection, abscess, leak, injury to other organs, need for further treatment, heart attack, death, and other risks were discussed.  I noted a good likelihood this will help address the problem.  Possibility that this will not correct all abdominal symptoms was explained.  Goals of post-operative recovery were discussed as well.  We will work to minimize complications.  An educational handout further explaining the pathology and treatment options was given as well.  Questions were answered.  The patient expresses understanding & wishes to proceed with surgery. Vanita Panda, MD Colon and Rectal Surgery / General Surgery [facility]      Visit Diagnoses: 1. Nonfunctioning gallbladder     Primary Care Physician: Pearla Dubonnet, MD

## 2012-04-26 NOTE — Transfer of Care (Signed)
Immediate Anesthesia Transfer of Care Note  Patient: Brandon [name]  Procedure(s) Performed: Procedure(s) (LRB) with comments: LAPAROSCOPIC CHOLECYSTECTOMY (N/A)  Patient Location: PACU  Anesthesia Type:General  Level of Consciousness: awake, alert , oriented and patient cooperative  Airway & Oxygen Therapy: Patient Spontanous Breathing and Patient connected to face mask oxygen  Post-op Assessment: Report given to PACU RN, Post -op Vital signs reviewed and stable and Patient moving all extremities  Post vital signs: Reviewed and stable  Complications: No apparent anesthesia complications

## 2012-04-26 NOTE — Preoperative (Signed)
Beta Blockers   Reason not to administer Beta Blockers:Not Applicable 

## 2012-04-27 ENCOUNTER — Encounter (HOSPITAL_COMMUNITY): Payer: Self-pay | Admitting: General Surgery

## 2012-04-27 LAB — CBC
HCT: 35.7 % — ABNORMAL LOW (ref 39.0–52.0)
MCHC: 33.9 g/dL (ref 30.0–36.0)
MCV: 94.4 fL (ref 78.0–100.0)
RDW: 11.9 % (ref 11.5–15.5)

## 2012-04-27 MED ORDER — SODIUM CHLORIDE 0.9 % IV SOLN: INTRAVENOUS | Status: DC

## 2012-04-27 MED ORDER — ACETAMINOPHEN 10 MG/ML IV SOLN: 1000.0000 mg | Freq: Four times a day (QID) | INTRAVENOUS | Status: DC

## 2012-04-27 MED ORDER — ACETAMINOPHEN 10 MG/ML IV SOLN
1000.0000 mg | Freq: Four times a day (QID) | INTRAVENOUS | Status: AC | PRN
  Administered 2012-04-27 – 2012-04-28 (×3): 1000 mg via INTRAVENOUS
  Filled 2012-04-27 (×4): qty 100

## 2012-04-27 MED ORDER — TRAMADOL HCL 50 MG PO TABS
50.0000 mg | ORAL_TABLET | Freq: Four times a day (QID) | ORAL | Status: DC | PRN
  Administered 2012-04-27: 50 mg via ORAL
  Filled 2012-04-27: qty 1

## 2012-04-27 MED ORDER — TRAMADOL HCL 50 MG PO TABS: 50.0000 mg | ORAL_TABLET | Freq: Four times a day (QID) | ORAL | Status: DC | PRN

## 2012-04-27 NOTE — Discharge Summary (Addendum)
Physician Discharge Summary  Patient ID: JESSELEE POTH MRN: [id number] DOB/AGE: [birth year] 57 y.o.  Admit date: 04/26/2012 Discharge date: 04/28/2012  Admission Diagnoses:  Discharge Diagnoses:  Active Problems:  * No active hospital problems. *    Discharged Condition: good  Hospital Course: The patient was admitted for overnight observation after a laparoscopic lysis of adhesions and cholecystectomy.  He remained overnight the following day due to nausea and vomiting.  By POD 2, the patient was tolerating liquids and was deemed to be in stable condition for d/c home.  Consults: None  Significant Diagnostic Studies: labs: CBC  Treatments: IV hydration and analgesia: acetaminophen  Discharge Exam: Blood pressure 124/75, pulse 87, temperature 98.9 F (37.2 C), temperature source Oral, resp. rate 18, height 5\' 9"  (1.753 m), weight 163 lb (73.936 kg), SpO2 97.00%. General appearance: alert, cooperative and no distress GI: soft, nondistended, appropriately tender Incision/Wound: clean, dry and intact  Disposition:       Discharge Orders    Future Appointments: Provider: Department: Dept Phone: Center:   05/10/2012 2:00 PM Romie Levee, MD [facility] Surgery, [place] [phone] None       Medication List     As of 04/28/2012  2:45 PM    TAKE these medications         acetaminophen 500 MG tablet   Commonly known as: TYLENOL   Take 500-1,000 mg by mouth every 6 (six) hours as needed. Pain      cabergoline 0.5 MG tablet   Commonly known as: DOSTINEX   Take 0.25 mg by mouth 2 (two) times a week. Takes 1/2 tablet      clonazePAM 0.5 MG tablet   Commonly known as: KLONOPIN   Take 0.25 mg by mouth 2 (two) times daily as needed. Anxiety      ibuprofen 400 MG tablet   Commonly known as: ADVIL,MOTRIN   Take 1 tablet (400 mg total) by mouth every 4 (four) hours.      ibuprofen 200 MG tablet   Commonly known as: ADVIL,MOTRIN   Take 400-600 mg by mouth every 6 (six)  hours as needed. Pain      promethazine 12.5 MG tablet   Commonly known as: PHENERGAN   Take 1 tablet (12.5 mg total) by mouth every 4 (four) hours as needed.        Follow-up Information    Follow up with Vanita Panda., MD. Schedule an appointment as soon as possible for a visit in 3 weeks.   Contact information:   [address] [place] [address] [phone]          Signed: Vanita Panda 04/28/2012, 2:45 PM

## 2012-04-27 NOTE — Progress Notes (Signed)
Patient has decided to stay the night, for IV tylenol and zofran for pain and nausea control.

## 2012-04-27 NOTE — Progress Notes (Addendum)
Spoke to Dr. Maisie Fus informed her patient medicated this am for pain with tramadol at 1029, patient only doing full liquids c/o nausea and medicated with zofran at 1255, but stats is still nauseated believes is due to tramadol, MD states when patient home can alternate between tylenol and advil. Discussed phenergan with MD but I have spoken to patient whom is very leary of strong pain medication or sedative meds and patient seemed resistant to taking phenergan, MD agrees with assessment after talking with patient and parents this am.  Informed her that patient usually c/o pain 3/10 when sitting and 5/10 when ambulating but currently c/o 7/10 when lying down and iv tylenol given states good pain relief but discussed with patient that po tylenol will most likely not be as strong when home. Patient has very little flatus out in ostomy but active bowel sounds. Dr. Maisie Fus states she is fine with patient staying the night or going home tonight it is up to him. Will speak with patient and family.

## 2012-04-27 NOTE — Progress Notes (Signed)
Called [facility] office and per nursing office Dr. Maisie Fus to call me back, patient states still nauseated and c/o increased pain but only wants IV tylenol at this time, will report to MD for further instruction.

## 2012-04-28 MED ORDER — PROMETHAZINE HCL 12.5 MG PO TABS: 12.5000 mg | ORAL_TABLET | ORAL | Status: DC | PRN

## 2012-04-28 MED ORDER — SCOPOLAMINE 1 MG/3DAYS TD PT72
1.0000 | MEDICATED_PATCH | TRANSDERMAL | Status: DC | PRN
  Administered 2012-04-28: 1.5 mg via TRANSDERMAL
  Filled 2012-04-28 (×2): qty 1

## 2012-04-28 MED ORDER — IBUPROFEN 400 MG PO TABS: 400.0000 mg | ORAL_TABLET | ORAL | Status: DC

## 2012-04-28 MED ORDER — IBUPROFEN 400 MG PO TABS
400.0000 mg | ORAL_TABLET | ORAL | Status: DC
  Administered 2012-04-28 (×2): 400 mg via ORAL
  Filled 2012-04-28 (×6): qty 1

## 2012-04-28 MED ORDER — PROMETHAZINE HCL 25 MG PO TABS
12.5000 mg | ORAL_TABLET | ORAL | Status: DC | PRN
  Administered 2012-04-28: 12.5 mg via ORAL
  Filled 2012-04-28 (×2): qty 1

## 2012-04-28 MED ORDER — IBUPROFEN 600 MG PO TABS: 600.0000 mg | ORAL_TABLET | ORAL | Status: DC

## 2012-04-28 NOTE — Progress Notes (Signed)
2 Days Post-Op lap LOA and chole Subjective: Emesis this AM, feels a little better now, air and stool in bag, urinating well  Objective: Vital signs in last 24 hours: Temp:  [98 F (36.7 C)-98.8 F (37.1 C)] 98 F (36.7 C) (01/17 0602) Pulse Rate:  [83-103] 103  (01/17 0602) Resp:  [16-18] 18  (01/17 0602) BP: (120-138)/(73-80) 138/80 mmHg (01/17 0602) SpO2:  [94 %-98 %] 94 % (01/17 0602)   Intake/Output from previous day: 01/16 0701 - 01/17 0700 In: 1056.7 [P.O.:240; I.V.:816.7] Out: 1625 [Urine:1525; Emesis/NG output:100] Intake/Output this shift: Total I/O In: 0  Out: 350 [Urine:350]   General appearance: alert and cooperative GI: soft, appropriately tender at incision sites  Incision: no significant drainage, no significant erythema  Lab Results:   [facility] 04/27/12 0457  WBC 10.7*  HGB 12.1*  HCT 35.7*  PLT 209   BMET No results found for this basename: NA:2,K:2,CL:2,CO2:2,GLUCOSE:2,BUN:2,CREATININE:2,CALCIUM:2 in the last 72 hours PT/INR No results found for this basename: LABPROT:2,INR:2 in the last 72 hours ABG No results found for this basename: PHART:2,PCO2:2,PO2:2,HCO3:2 in the last 72 hours  MEDS, Scheduled    . cabergoline  0.25 mg Oral 2 times weekly  . enoxaparin (LOVENOX) injection  40 mg Subcutaneous Q24H  . ibuprofen  400 mg Oral Q4H    Studies/Results: Dg Cholangiogram Operative  04/26/2012  *RADIOLOGY REPORT*  Clinical Data:   Biliary dyskinesia.  INTRAOPERATIVE CHOLANGIOGRAM  Technique:  Cholangiographic images from the C-arm fluoroscopic device were submitted for interpretation post-operatively.  Please see the procedural report for the amount of contrast and the fluoroscopy time utilized.  Comparison:  CT abdomen pelvis 02/17/2012. Ultrasound 10/29/2011.  Findings:  The gallbladder has been removed and the cystic duct cannulated.  There is good opacification of the cystic duct remnant, common hepatic duct, and common bile duct.  No  filling defects are seen.  There is prompt passage of contrast into the duodenum.  IMPRESSION: Negative operative cholangiogram.   Original Report Authenticated By: Davonna Belling, M.D.     Assessment: s/p Procedure(s): LAPAROSCOPIC CHOLECYSTECTOMY There is no problem list on file for this patient.   Doing OK, most likely has post op ileus  Plan: Advance diet to fulls today Will do Ibuprofen and tylenol PO today as he doesn't seem to tolerate PO narcotics Phenergan for nausea, scopolamine patch if needed Ok to d/c today if he can tolerate liquids and pain and nausea controlled   LOS: 2 days     .Vanita Panda, MD [facility] Surgery, [place] [phone]   04/28/2012 10:47 AM

## 2012-05-10 ENCOUNTER — Ambulatory Visit (INDEPENDENT_AMBULATORY_CARE_PROVIDER_SITE_OTHER): Payer: BC Managed Care – PPO | Admitting: General Surgery

## 2012-05-10 ENCOUNTER — Encounter (INDEPENDENT_AMBULATORY_CARE_PROVIDER_SITE_OTHER): Payer: Self-pay | Admitting: General Surgery

## 2012-05-10 VITALS — BP 130/80 | HR 81 | Temp 96.6°F | Resp 16 | Ht 69.0 in | Wt 155.4 lb

## 2012-05-10 DIAGNOSIS — Z9049 Acquired absence of other specified parts of digestive tract: Secondary | ICD-10-CM

## 2012-05-10 DIAGNOSIS — Z9089 Acquired absence of other organs: Secondary | ICD-10-CM

## 2012-05-10 NOTE — Progress Notes (Signed)
Brandon [name] is a 57 y.o. male who is status post a a lap LOA and Chole on 1/15.  He is still having a lot of right sided pain.  He is eating well without nausea and he denies any fevers.  He is not having obstructive symptoms.  Objective: Filed Vitals:   05/10/12 1358  BP: 130/80  Pulse: 81  Temp: 96.6 F (35.9 C)  Resp: 16    General appearance: alert and cooperative GI: normal findings: soft, non-tender  Incision: healing well, periumbilical bruising noted.   Assessment: s/p lap chole and LOA There is no problem list on file for this patient.   Plan: RUQ pain may be due to healing from surgery.  Continue LRD and f/u in 3 weeks.  He is going to talk to Dr Letitia Libra about a chronic pain referral.      .Vanita Panda, MD [facility] Surgery, [place] [phone]   05/10/2012 2:22 PM

## 2012-05-10 NOTE — Patient Instructions (Signed)
Continue low residue diet.  Return to office in 3 weeks

## 2012-05-27 ENCOUNTER — Other Ambulatory Visit: Payer: Self-pay

## 2012-06-06 ENCOUNTER — Ambulatory Visit (INDEPENDENT_AMBULATORY_CARE_PROVIDER_SITE_OTHER): Payer: BC Managed Care – PPO | Admitting: General Surgery

## 2012-06-06 ENCOUNTER — Encounter (INDEPENDENT_AMBULATORY_CARE_PROVIDER_SITE_OTHER): Payer: Self-pay | Admitting: General Surgery

## 2012-06-06 VITALS — BP 138/78 | HR 84 | Temp 98.0°F | Resp 20 | Ht 69.0 in | Wt 156.4 lb

## 2012-06-06 DIAGNOSIS — G8929 Other chronic pain: Secondary | ICD-10-CM

## 2012-06-06 DIAGNOSIS — R1011 Right upper quadrant pain: Secondary | ICD-10-CM

## 2012-06-06 NOTE — Patient Instructions (Signed)
Continue to work with your doctors to evaluate your abdominal pain.  Follow up with me as needed.

## 2012-06-06 NOTE — Progress Notes (Signed)
Brandon [name] is a 57 y.o. male who is status post a lap chole in early Jan.  He is still having chronic abd pain.  This seems to be related to eating.  He denies nausea and vomiting.  He weight is stable.    Objective: Filed Vitals:   06/06/12 1224  BP: 138/78  Pulse: 84  Temp: 98 F (36.7 C)  Resp: 20    General appearance: alert and cooperative Abd: soft Incision: healing well   Assessment: s/p  Patient Active Problem List  Diagnosis  . Abdominal pain, chronic, right upper quadrant    Plan: I have recommended that he follow up on his CT scan and see if those pelvic lymph nodes are enlarging.  His CEA is mildly elevated.  He sees an oncologist at [facility] that has ordered this repeat CT.  I encourage him to find a medical doctor that would be willing to continue to work up his chronic abd pain.  I do not think he has anything that a surgery can fix at the moment.  Certainly if his lymph nodes continue to enlarge, we may need to entertain the idea of doing another pelvic resection.        Vanita Panda, MD [facility] Surgery, [place] [phone]   06/06/2012 1:03 PM

## 2012-06-13 ENCOUNTER — Other Ambulatory Visit: Payer: Self-pay | Admitting: Infectious Diseases

## 2012-06-20 ENCOUNTER — Other Ambulatory Visit: Payer: Self-pay | Admitting: Internal Medicine

## 2012-06-20 DIAGNOSIS — R109 Unspecified abdominal pain: Secondary | ICD-10-CM

## 2012-06-25 ENCOUNTER — Ambulatory Visit
Admission: RE | Admit: 2012-06-25 | Discharge: 2012-06-25 | Disposition: A | Discharge status: Home / Self Care | Payer: BC Managed Care – PPO | Source: Ambulatory Visit | Attending: Internal Medicine | Admitting: Internal Medicine

## 2012-06-25 DIAGNOSIS — R109 Unspecified abdominal pain: Secondary | ICD-10-CM

## 2012-06-25 MED ORDER — GADOBENATE DIMEGLUMINE 529 MG/ML IV SOLN
20.0000 mL | Freq: Once | INTRAVENOUS | Status: AC | PRN
  Administered 2012-06-25: 20 mL via INTRAVENOUS

## 2012-06-29 ENCOUNTER — Encounter (HOSPITAL_COMMUNITY): Payer: Self-pay | Admitting: *Deleted

## 2012-06-30 ENCOUNTER — Encounter (HOSPITAL_COMMUNITY): Payer: Self-pay | Admitting: Pharmacy Technician

## 2012-07-04 ENCOUNTER — Other Ambulatory Visit: Payer: Self-pay | Admitting: Gastroenterology

## 2012-07-04 ENCOUNTER — Other Ambulatory Visit (HOSPITAL_COMMUNITY): Payer: Self-pay | Admitting: Internal Medicine

## 2012-07-04 DIAGNOSIS — R1011 Right upper quadrant pain: Secondary | ICD-10-CM

## 2012-07-04 NOTE — Addendum Note (Signed)
Addended by: Willis Modena on: 07/04/2012 05:30 PM   Modules accepted: Orders

## 2012-07-05 ENCOUNTER — Encounter (HOSPITAL_COMMUNITY)
Admission: RE | Disposition: A | Discharge status: Home / Self Care | Payer: Self-pay | Source: Ambulatory Visit | Attending: Gastroenterology

## 2012-07-05 ENCOUNTER — Ambulatory Visit (HOSPITAL_COMMUNITY)
Admission: RE | Admit: 2012-07-05 | Discharge: 2012-07-05 | Disposition: A | Discharge status: Home / Self Care | Payer: BC Managed Care – PPO | Source: Ambulatory Visit | Attending: Gastroenterology | Admitting: Gastroenterology

## 2012-07-05 ENCOUNTER — Encounter (HOSPITAL_COMMUNITY): Payer: Self-pay | Admitting: Anesthesiology

## 2012-07-05 ENCOUNTER — Ambulatory Visit (HOSPITAL_COMMUNITY): Payer: BC Managed Care – PPO | Admitting: Anesthesiology

## 2012-07-05 DIAGNOSIS — H8110 Benign paroxysmal vertigo, unspecified ear: Secondary | ICD-10-CM | POA: Insufficient documentation

## 2012-07-05 DIAGNOSIS — R599 Enlarged lymph nodes, unspecified: Secondary | ICD-10-CM | POA: Insufficient documentation

## 2012-07-05 DIAGNOSIS — F411 Generalized anxiety disorder: Secondary | ICD-10-CM | POA: Insufficient documentation

## 2012-07-05 DIAGNOSIS — K269 Duodenal ulcer, unspecified as acute or chronic, without hemorrhage or perforation: Secondary | ICD-10-CM | POA: Insufficient documentation

## 2012-07-05 DIAGNOSIS — F3289 Other specified depressive episodes: Secondary | ICD-10-CM | POA: Insufficient documentation

## 2012-07-05 DIAGNOSIS — N529 Male erectile dysfunction, unspecified: Secondary | ICD-10-CM | POA: Insufficient documentation

## 2012-07-05 DIAGNOSIS — Z79899 Other long term (current) drug therapy: Secondary | ICD-10-CM | POA: Insufficient documentation

## 2012-07-05 DIAGNOSIS — Z85048 Personal history of other malignant neoplasm of rectum, rectosigmoid junction, and anus: Secondary | ICD-10-CM | POA: Insufficient documentation

## 2012-07-05 DIAGNOSIS — R1013 Epigastric pain: Secondary | ICD-10-CM | POA: Insufficient documentation

## 2012-07-05 DIAGNOSIS — E236 Other disorders of pituitary gland: Secondary | ICD-10-CM | POA: Insufficient documentation

## 2012-07-05 DIAGNOSIS — Z9221 Personal history of antineoplastic chemotherapy: Secondary | ICD-10-CM | POA: Insufficient documentation

## 2012-07-05 DIAGNOSIS — J309 Allergic rhinitis, unspecified: Secondary | ICD-10-CM | POA: Insufficient documentation

## 2012-07-05 DIAGNOSIS — Z923 Personal history of irradiation: Secondary | ICD-10-CM | POA: Insufficient documentation

## 2012-07-05 DIAGNOSIS — F329 Major depressive disorder, single episode, unspecified: Secondary | ICD-10-CM | POA: Insufficient documentation

## 2012-07-05 DIAGNOSIS — I4510 Unspecified right bundle-branch block: Secondary | ICD-10-CM | POA: Insufficient documentation

## 2012-07-05 DIAGNOSIS — Z8042 Family history of malignant neoplasm of prostate: Secondary | ICD-10-CM | POA: Insufficient documentation

## 2012-07-05 DIAGNOSIS — Z9089 Acquired absence of other organs: Secondary | ICD-10-CM | POA: Insufficient documentation

## 2012-07-05 HISTORY — PX: EUS: SHX5427

## 2012-07-05 HISTORY — PX: NEUROLYTIC CELIAC PLEXUS: SHX5435

## 2012-07-05 SURGERY — ESOPHAGEAL ENDOSCOPIC ULTRASOUND (EUS) RADIAL
Anesthesia: Monitor Anesthesia Care

## 2012-07-05 MED ORDER — FENTANYL CITRATE 0.05 MG/ML IJ SOLN
INTRAMUSCULAR | Status: DC | PRN
  Administered 2012-07-05 (×4): 25 ug via INTRAVENOUS

## 2012-07-05 MED ORDER — PROPOFOL 10 MG/ML IV BOLUS
INTRAVENOUS | Status: DC | PRN
Start: 2012-07-05 — End: 2012-07-05
  Administered 2012-07-05 (×3): 20 mg via INTRAVENOUS

## 2012-07-05 MED ORDER — PROPOFOL 10 MG/ML IV EMUL
INTRAVENOUS | Status: DC | PRN
  Administered 2012-07-05: 75 ug/kg/min via INTRAVENOUS

## 2012-07-05 MED ORDER — ACETAMINOPHEN 325 MG PO TABS
650.0000 mg | ORAL_TABLET | Freq: Once | ORAL | Status: AC
  Administered 2012-07-05: 650 mg via ORAL
  Filled 2012-07-05: qty 2

## 2012-07-05 MED ORDER — BUPIVACAINE HCL (PF) 0.25 % IJ SOLN
INTRAMUSCULAR | Status: AC
  Filled 2012-07-05: qty 30

## 2012-07-05 MED ORDER — MIDAZOLAM HCL 5 MG/5ML IJ SOLN
INTRAMUSCULAR | Status: DC | PRN
  Administered 2012-07-05: 2 mg via INTRAVENOUS

## 2012-07-05 MED ORDER — TRIAMCINOLONE ACETONIDE 40 MG/ML IJ SUSP
INTRAMUSCULAR | Status: AC
  Filled 2012-07-05: qty 2

## 2012-07-05 MED ORDER — SODIUM CHLORIDE 0.9 % IV SOLN: INTRAVENOUS | Status: DC

## 2012-07-05 MED ORDER — LIDOCAINE HCL (CARDIAC) 20 MG/ML IV SOLN
INTRAVENOUS | Status: DC | PRN
  Administered 2012-07-05: 50 mg via INTRAVENOUS

## 2012-07-05 MED ORDER — LACTATED RINGERS IV SOLN
INTRAVENOUS | Status: DC | PRN
  Administered 2012-07-05: 09:00:00 via INTRAVENOUS

## 2012-07-05 NOTE — H&P (Signed)
Patient interval history reviewed.  Patient examined again.  There has been no change from documented H/P dated 06/28/12 (scanned into chart from our office) except as documented above.   Assessment:  1.  Abdominal pain. 2.  History of rectal cancer, post ileostomy. 3.  Elevated Ca 19-9.  Plan:  1.  Endoscopic ultrasound with possible biopsies (FNA) and possible celiac plexus block. 2.  Risks (bleeding, infection, bowel perforation that could require surgery, sedation-related changes in cardiopulmonary systems), benefits (identification and possible treatment of source of symptoms, exclusion of certain causes of symptoms), and alternatives (watchful waiting, radiographic imaging studies, empiric medical treatment) of upper endoscopy with ultrasound and possible biopsy and/or celiac plexus block (EUS +/- FNA +/- celiac plexus block, +/- EGD) were explained to patient/family in detail and patient wishes to proceed.

## 2012-07-05 NOTE — Anesthesia Preprocedure Evaluation (Addendum)
Anesthesia Evaluation  Patient identified by MRN, date of birth, ID band Patient awake    Reviewed: Allergy & Precautions, H&P , NPO status , Patient's Chart, lab work & pertinent test results  Airway Mallampati: II TM Distance: >3 FB Neck ROM: full    Dental no notable dental hx. (+) Teeth Intact and Dental Advisory Given   Pulmonary neg pulmonary ROS,  breath sounds clear to auscultation  Pulmonary exam normal       Cardiovascular Exercise Tolerance: Good negative cardio ROS  Rhythm:regular Rate:Normal     Neuro/Psych Pituitary tumor. Prolactin secreting.  hypogonadism negative neurological ROS  negative psych ROS   GI/Hepatic negative GI ROS, Neg liver ROS,   Endo/Other  negative endocrine ROS  Renal/GU negative Renal ROS  negative genitourinary   Musculoskeletal   Abdominal   Peds  Hematology negative hematology ROS (+)   Anesthesia Other Findings   Reproductive/Obstetrics negative OB ROS                        Anesthesia Physical Anesthesia Plan  ASA: II  Anesthesia Plan: MAC   Post-op Pain Management:    Induction:   Airway Management Planned: Simple Face Mask  Additional Equipment:   Intra-op Plan:   Post-operative Plan:   Informed Consent: I have reviewed the patients History and Physical, chart, labs and discussed the procedure including the risks, benefits and alternatives for the proposed anesthesia with the patient or authorized representative who has indicated his/her understanding and acceptance.   Dental Advisory Given  Plan Discussed with: CRNA and Surgeon  Anesthesia Plan Comments:        Anesthesia Quick Evaluation

## 2012-07-05 NOTE — Op Note (Signed)
[facility] [address] [place], [address]   ENDOSCOPIC ULTRASOUND PROCEDURE REPORT PATIENT: Brandon [name], Brandon [name]  MR#: [id number] BIRTHDATE: [birth year]  GENDER: Male ENDOSCOPIST: Willis Modena, MD REFERRED BY:  R.  Robley Fries, M.D. PROCEDURE DATE:  07/05/2012 PROCEDURE:   Upper EUS ASA CLASS:      Class II INDICATIONS:   1.  abdominal pain, elevated Ca 19-9, history rectal cancer. MEDICATIONS: Cetacaine spray x 2 and MAC sedation, administered by CRNA DESCRIPTION OF PROCEDURE:   After the risks benefits and alternatives of the procedure were  explained, informed consent was obtained. The patient was then placed in the left, lateral, decubitus postion and IV sedation was administered. Throughout the procedure, the patients blood pressure, pulse and oxygen saturations were monitored continuously.  Under direct visualization, the EUS scope [id number]  endoscope was introduced through the mouth  and advanced to the    .  Water was used as necessary to provide an acoustic interface.  Upon completion of the imaging, water was removed and the patient was sent to the recovery room in satisfactory condition.   FINDINGS:      EGD:  Post-bulbar stenosis with ulceration (friable, but no active bleeding or visible vessel); otherwise normal. EUS:  Heterogenous pancreatic parenchyma, but not diagnostic of chronic pancreatitis.  Normal bile duct.  No pancreatic mass. Post-cholecystectomy.  IMPRESSION:     As above.  Duodenal ulcer likely NSAID-related, doubtful cause of patient's significant pain.  No pancreatic lesion to explain patient's elevated Ca 19-9 (perhaps this is due to patient's rectal cancer).  RECOMMENDATIONS:     1.  Watch for potential complications of procedure. 2.  Prilosec OTC 20 mg po qd x 6 weeks. 3.  Will discuss with Dr. Kevan Ny.  ______________________________ Rosalie DoctorWillis Modena, MD 07/05/2012 11:11 AMsignCC:

## 2012-07-05 NOTE — Transfer of Care (Signed)
Immediate Anesthesia Transfer of Care Note  Patient: Brandon [name]  Procedure(s) Performed: Procedure(s) with comments: ESOPHAGEAL ENDOSCOPIC ULTRASOUND (EUS) RADIAL (N/A) - celiac block NEUROLYTIC CELIAC PLEXUS (N/A)  Patient Location: PACU  Anesthesia Type:MAC  Level of Consciousness: awake, alert  and oriented  Airway & Oxygen Therapy: Patient Spontanous Breathing and Patient connected to nasal cannula oxygen  Post-op Assessment: Report given to PACU RN and Post -op Vital signs reviewed and stable  Post vital signs: Reviewed and stable  Complications: No apparent anesthesia complications

## 2012-07-05 NOTE — Anesthesia Postprocedure Evaluation (Signed)
  Anesthesia Post-op Note  Patient: Brandon [name]  Procedure(s) Performed: Procedure(s) (LRB): ESOPHAGEAL ENDOSCOPIC ULTRASOUND (EUS) RADIAL (N/A) NEUROLYTIC CELIAC PLEXUS (N/A)  Patient Location: PACU  Anesthesia Type: MAC  Level of Consciousness: awake and alert   Airway and Oxygen Therapy: Patient Spontanous Breathing  Post-op Pain: mild  Post-op Assessment: Post-op Vital signs reviewed, Patient's Cardiovascular Status Stable, Respiratory Function Stable, Patent Airway and No signs of Nausea or vomiting  Last Vitals:  Filed Vitals:   07/05/12 0843  Pulse: 87  Temp: 36.7 C  Resp: 16    Post-op Vital Signs: stable   Complications: No apparent anesthesia complications

## 2012-07-06 ENCOUNTER — Encounter (HOSPITAL_COMMUNITY): Payer: Self-pay | Admitting: Gastroenterology

## 2012-07-07 ENCOUNTER — Encounter (INDEPENDENT_AMBULATORY_CARE_PROVIDER_SITE_OTHER): Payer: Self-pay | Admitting: General Surgery

## 2012-07-11 ENCOUNTER — Ambulatory Visit (HOSPITAL_COMMUNITY)
Admission: RE | Admit: 2012-07-11 | Discharge: 2012-07-11 | Disposition: A | Discharge status: Home / Self Care | Payer: BC Managed Care – PPO | Source: Ambulatory Visit | Attending: Internal Medicine | Admitting: Internal Medicine

## 2012-07-11 DIAGNOSIS — R1011 Right upper quadrant pain: Secondary | ICD-10-CM

## 2012-09-26 ENCOUNTER — Encounter (HOSPITAL_COMMUNITY): Payer: Self-pay | Admitting: *Deleted

## 2012-09-26 ENCOUNTER — Emergency Department (HOSPITAL_COMMUNITY)
Admission: EM | Admit: 2012-09-26 | Discharge: 2012-09-26 | Disposition: A | Discharge status: Home / Self Care | Payer: BC Managed Care – PPO | Attending: Emergency Medicine | Admitting: Emergency Medicine

## 2012-09-26 DIAGNOSIS — R1031 Right lower quadrant pain: Secondary | ICD-10-CM | POA: Insufficient documentation

## 2012-09-26 DIAGNOSIS — R112 Nausea with vomiting, unspecified: Secondary | ICD-10-CM | POA: Insufficient documentation

## 2012-09-26 DIAGNOSIS — Z8669 Personal history of other diseases of the nervous system and sense organs: Secondary | ICD-10-CM | POA: Insufficient documentation

## 2012-09-26 DIAGNOSIS — Z85038 Personal history of other malignant neoplasm of large intestine: Secondary | ICD-10-CM | POA: Insufficient documentation

## 2012-09-26 DIAGNOSIS — Z862 Personal history of diseases of the blood and blood-forming organs and certain disorders involving the immune mechanism: Secondary | ICD-10-CM | POA: Insufficient documentation

## 2012-09-26 DIAGNOSIS — Z9089 Acquired absence of other organs: Secondary | ICD-10-CM | POA: Insufficient documentation

## 2012-09-26 DIAGNOSIS — Z8719 Personal history of other diseases of the digestive system: Secondary | ICD-10-CM | POA: Insufficient documentation

## 2012-09-26 DIAGNOSIS — Z87448 Personal history of other diseases of urinary system: Secondary | ICD-10-CM | POA: Insufficient documentation

## 2012-09-26 DIAGNOSIS — C785 Secondary malignant neoplasm of large intestine and rectum: Secondary | ICD-10-CM | POA: Insufficient documentation

## 2012-09-26 DIAGNOSIS — Z85048 Personal history of other malignant neoplasm of rectum, rectosigmoid junction, and anus: Secondary | ICD-10-CM | POA: Insufficient documentation

## 2012-09-26 DIAGNOSIS — R109 Unspecified abdominal pain: Secondary | ICD-10-CM

## 2012-09-26 DIAGNOSIS — Z9049 Acquired absence of other specified parts of digestive tract: Secondary | ICD-10-CM | POA: Insufficient documentation

## 2012-09-26 DIAGNOSIS — Z79899 Other long term (current) drug therapy: Secondary | ICD-10-CM | POA: Insufficient documentation

## 2012-09-26 DIAGNOSIS — G8929 Other chronic pain: Secondary | ICD-10-CM | POA: Insufficient documentation

## 2012-09-26 DIAGNOSIS — Z8639 Personal history of other endocrine, nutritional and metabolic disease: Secondary | ICD-10-CM | POA: Insufficient documentation

## 2012-09-26 LAB — CBC WITH DIFFERENTIAL/PLATELET
Basophils Absolute: 0 10*3/uL (ref 0.0–0.1)
Eosinophils Absolute: 0.1 10*3/uL (ref 0.0–0.7)
Hemoglobin: 14.9 g/dL (ref 13.0–17.0)
Lymphs Abs: 1 10*3/uL (ref 0.7–4.0)
Monocytes Relative: 8 % (ref 3–12)
Neutro Abs: 4.9 10*3/uL (ref 1.7–7.7)
Neutrophils Relative %: 76 % (ref 43–77)
Platelets: 248 10*3/uL (ref 150–400)
RBC: 4.68 MIL/uL (ref 4.22–5.81)

## 2012-09-26 LAB — COMPREHENSIVE METABOLIC PANEL
AST: 20 U/L (ref 0–37)
CO2: 25 mEq/L (ref 19–32)
Calcium: 10.8 mg/dL — ABNORMAL HIGH (ref 8.4–10.5)
Creatinine, Ser: 0.72 mg/dL (ref 0.50–1.35)
GFR calc Af Amer: >90 mL/min (ref >90)
GFR calc non Af Amer: >90 mL/min (ref >90)

## 2012-09-26 MED ORDER — HYDROMORPHONE HCL PF 2 MG/ML IJ SOLN: 2.0000 mg | Freq: Once | INTRAMUSCULAR | Status: DC

## 2012-09-26 MED ORDER — ONDANSETRON HCL 4 MG/2ML IJ SOLN
4.0000 mg | Freq: Once | INTRAMUSCULAR | Status: AC
  Administered 2012-09-26: 4 mg via INTRAVENOUS
  Filled 2012-09-26: qty 2

## 2012-09-26 MED ORDER — HYDROMORPHONE HCL PF 2 MG/ML IJ SOLN
2.0000 mg | Freq: Once | INTRAMUSCULAR | Status: AC
  Administered 2012-09-26: 2 mg via INTRAMUSCULAR
  Filled 2012-09-26: qty 1

## 2012-09-26 MED ORDER — ONDANSETRON 4 MG PO TBDP: 4.0000 mg | ORAL_TABLET | Freq: Three times a day (TID) | ORAL | Status: DC | PRN

## 2012-09-26 MED ORDER — ONDANSETRON HCL 4 MG/2ML IJ SOLN: 4.0000 mg | Freq: Once | INTRAMUSCULAR | Status: DC

## 2012-09-26 MED ORDER — SODIUM CHLORIDE 0.9 % IV BOLUS (SEPSIS)
1000.0000 mL | Freq: Once | INTRAVENOUS | Status: AC
  Administered 2012-09-26: 1000 mL via INTRAVENOUS

## 2012-09-26 MED ORDER — ONDANSETRON 4 MG PO TBDP
4.0000 mg | ORAL_TABLET | Freq: Once | ORAL | Status: AC
  Administered 2012-09-26: 4 mg via ORAL
  Filled 2012-09-26: qty 1

## 2012-09-26 MED ORDER — HYDROMORPHONE HCL PF 2 MG/ML IJ SOLN
2.0000 mg | Freq: Once | INTRAMUSCULAR | Status: AC
  Administered 2012-09-26: 2 mg via INTRAVENOUS
  Filled 2012-09-26: qty 1

## 2012-09-26 NOTE — ED Notes (Signed)
Pt reports severe abd pain since Sunday, has hx of pelvic tumor, have scheduled surgery to remove the tumor July 3rd.  Pt reports calling her pain mgt ctr MD at [facility] and was instructed to come to the ED for pain mgt.  Pt also reports n/v since Sunday.

## 2012-09-26 NOTE — ED Provider Notes (Signed)
History     CSN: [id number]  Arrival date & time 09/26/12  1509   First MD Initiated Contact with Patient 09/26/12 1600      Chief Complaint  Patient presents with  . Abdominal Pain    HPI  Patient has a notable history of metastatic rectal carcinoma, chronic pain.  He states that over the past 3 days has had increasing pain.  Pain is not improved with new addition of Nucynta to his pain regimen. Patient always has some pain, though this episode is more severe than a typical episode.  He sees a pain management specialist in [place].  Yesterday he saw a specialist, was started on his new medication. He states that otherwise, he has no new fever, chills, confusion, disorientation, dyspnea, chest pain. He had one episode of emesis yesterday. No emesis today. The patient spoke with his pain management specialist today and was advised to come here for further evaluation and management. Patient's cancer history is notable for initial diagnosis of colon cancer with subtotal colectomy, followed by resection of his rectum several years later.  He has a colostomy.  He was recently evaluated by another clinic, found to have new recurrence.  A focal lesion thought to be metastatic recurrence of rectal carcinoma located near the bifurcation of the aorta.  He is scheduled for surgical removal in 2 weeks.  Past Medical History  Diagnosis Date  . Biliary dyskinesia   . Bowel obstruction   . Ulcerative colitis   . Allergy   . Depression   . Ileostomy in place   . ED (erectile dysfunction)   . Benign positional vertigo   . Prolactinoma   . Hypogonadism male   . History of blood transfusion   . Pituitary tumor 2009  . 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 Colon surgery  2003    Colectomy with ileostomy  . Rectal surgery  2009    Resection of rectal pouch  . Knee arthroscopy    . Cholecystectomy  04/26/2012    Procedure:  LAPAROSCOPIC CHOLECYSTECTOMY;  Surgeon: Romie Levee, MD;  Location: [facility];  Service: General;  Laterality: N/A;  . Eus N/A 07/05/2012    Procedure: ESOPHAGEAL ENDOSCOPIC ULTRASOUND (EUS) RADIAL;  Surgeon: Willis Modena, MD;  Location: [facility];  Service: Endoscopy;  Laterality: N/A;  celiac block  . Neurolytic celiac plexus N/A 07/05/2012    Procedure: NEUROLYTIC CELIAC PLEXUS;  Surgeon: Willis Modena, MD;  Location: [facility];  Service: Endoscopy;  Laterality: N/A;    Family History  Problem Relation Age of Onset  . Cancer Mother     ovarian    History  Substance Use Topics  . Smoking status: Never Smoker   . Smokeless tobacco: Never Used  . Alcohol Use: No      Review of Systems  Constitutional:       Per HPI, otherwise negative  HENT:       Per HPI, otherwise negative  Respiratory:       Per HPI, otherwise negative  Cardiovascular:       Per HPI, otherwise negative  Gastrointestinal: Positive for nausea and vomiting.  Endocrine:       Negative aside from HPI  Genitourinary:       Neg aside from HPI   Musculoskeletal:       Per HPI, otherwise negative  Skin: Negative.   Neurological: Negative for syncope.  Allergies  Codeine; Effexor; and Imuran  Home Medications   Current Outpatient Rx  Name  Route  Sig  Dispense  Refill  . acetaminophen (TYLENOL) 500 MG tablet   Oral   Take 500 mg by mouth every 4 (four) hours as needed for pain.          . cabergoline (DOSTINEX) 0.5 MG tablet   Oral   Take 0.25 mg by mouth 2 (two) times a week. Mondays and Thursdays.         . clonazePAM (KLONOPIN) 0.5 MG tablet   Oral   Take 0.5 mg by mouth 2 (two) times daily as needed for anxiety.          Marland Kitchen ibuprofen (ADVIL,MOTRIN) 200 MG tablet   Oral   Take 200 mg by mouth every 4 (four) hours as needed for pain.          Marland Kitchen ondansetron (ZOFRAN) 8 MG tablet   Oral   Take 8 mg by mouth every 8 (eight) hours as needed for nausea.         . promethazine  (PHENERGAN) 25 MG tablet   Oral   Take 25 mg by mouth every 6 (six) hours as needed for nausea.         . tapentadol (NUCYNTA) 50 MG TABS   Oral   Take 50 mg by mouth every 4 (four) hours as needed (for pain).           BP 120/87  Pulse 98  Temp(Src) 97.8 F (36.6 C) (Oral)  Resp 20  SpO2 100%  Physical Exam  Nursing note and vitals reviewed. Constitutional: He is oriented to person, place, and time. He appears well-developed. He appears ill. No distress.  HENT:  Head: Normocephalic and atraumatic.  Eyes: Conjunctivae and EOM are normal.  Cardiovascular: Normal rate and regular rhythm.   Pulmonary/Chest: Effort normal. No stridor. No respiratory distress.  Abdominal: Soft. Normal appearance. He exhibits no distension. There is tenderness in the right lower quadrant.    Musculoskeletal: He exhibits no edema.       Arms: Neurological: He is alert and oriented to person, place, and time.  Skin: Skin is warm and dry.  Psychiatric: He has a normal mood and affect.    ED Course  Procedures (including critical care time)  Labs Reviewed  CBC WITH DIFFERENTIAL  COMPREHENSIVE METABOLIC PANEL   No results found.   No diagnosis found.  I reviewed the patient's ultrasound medical records, including those from affiliated facilities.  Pulse ox 99% room air normal  6:53 PM Patient awakened.  He states that he was improved.  8:22 PM Patient seems more comfortable. We discussed the need to f/u w pain management tomorrow and his surgeons as scheduled.  MDM  This patient with a history of rectal cancer colonoscopy for further surgical revision presents with worsening of his typical pain.  On exam he is awake and alert, hemodynamically stable, labs are largely unremarkable.  His pain was improved substantially with IV fluids, analgesics, antiemetics.  After discussion on the need to follow up with his pain management specialist, given this improvement, he was  discharged.        Gerhard Munch, MD 09/26/12 2025

## 2012-10-02 ENCOUNTER — Emergency Department (HOSPITAL_COMMUNITY)
Admission: EM | Admit: 2012-10-02 | Discharge: 2012-10-02 | Disposition: A | Discharge status: Home / Self Care | Payer: BC Managed Care – PPO | Attending: Emergency Medicine | Admitting: Emergency Medicine

## 2012-10-02 ENCOUNTER — Emergency Department (HOSPITAL_COMMUNITY): Payer: BC Managed Care – PPO

## 2012-10-02 ENCOUNTER — Encounter (HOSPITAL_COMMUNITY): Payer: Self-pay | Admitting: *Deleted

## 2012-10-02 DIAGNOSIS — R1033 Periumbilical pain: Secondary | ICD-10-CM | POA: Insufficient documentation

## 2012-10-02 DIAGNOSIS — F3289 Other specified depressive episodes: Secondary | ICD-10-CM | POA: Insufficient documentation

## 2012-10-02 DIAGNOSIS — Z8669 Personal history of other diseases of the nervous system and sense organs: Secondary | ICD-10-CM | POA: Insufficient documentation

## 2012-10-02 DIAGNOSIS — D352 Benign neoplasm of pituitary gland: Secondary | ICD-10-CM | POA: Insufficient documentation

## 2012-10-02 DIAGNOSIS — Z79899 Other long term (current) drug therapy: Secondary | ICD-10-CM | POA: Insufficient documentation

## 2012-10-02 DIAGNOSIS — D353 Benign neoplasm of craniopharyngeal duct: Secondary | ICD-10-CM | POA: Insufficient documentation

## 2012-10-02 DIAGNOSIS — Z8639 Personal history of other endocrine, nutritional and metabolic disease: Secondary | ICD-10-CM | POA: Insufficient documentation

## 2012-10-02 DIAGNOSIS — R109 Unspecified abdominal pain: Secondary | ICD-10-CM

## 2012-10-02 DIAGNOSIS — Z932 Ileostomy status: Secondary | ICD-10-CM | POA: Insufficient documentation

## 2012-10-02 DIAGNOSIS — F329 Major depressive disorder, single episode, unspecified: Secondary | ICD-10-CM | POA: Insufficient documentation

## 2012-10-02 DIAGNOSIS — Z85048 Personal history of other malignant neoplasm of rectum, rectosigmoid junction, and anus: Secondary | ICD-10-CM | POA: Insufficient documentation

## 2012-10-02 DIAGNOSIS — Z87448 Personal history of other diseases of urinary system: Secondary | ICD-10-CM | POA: Insufficient documentation

## 2012-10-02 DIAGNOSIS — Z85858 Personal history of malignant neoplasm of other endocrine glands: Secondary | ICD-10-CM | POA: Insufficient documentation

## 2012-10-02 DIAGNOSIS — Z8719 Personal history of other diseases of the digestive system: Secondary | ICD-10-CM | POA: Insufficient documentation

## 2012-10-02 DIAGNOSIS — Z862 Personal history of diseases of the blood and blood-forming organs and certain disorders involving the immune mechanism: Secondary | ICD-10-CM | POA: Insufficient documentation

## 2012-10-02 LAB — CBC WITH DIFFERENTIAL/PLATELET
Lymphocytes Relative: 31 % (ref 12–46)
Lymphs Abs: 1.3 10*3/uL (ref 0.7–4.0)
MCV: 90.1 fL (ref 78.0–100.0)
Neutrophils Relative %: 51 % (ref 43–77)
Platelets: 209 10*3/uL (ref 150–400)
RBC: 4.54 MIL/uL (ref 4.22–5.81)
WBC: 4.1 10*3/uL (ref 4.0–10.5)

## 2012-10-02 LAB — URINALYSIS, ROUTINE W REFLEX MICROSCOPIC
Bilirubin Urine: NEGATIVE
Ketones, ur: 40 mg/dL — AB
Nitrite: NEGATIVE
Urobilinogen, UA: 0.2 mg/dL (ref 0.0–1.0)
pH: 6 (ref 5.0–8.0)

## 2012-10-02 LAB — COMPREHENSIVE METABOLIC PANEL
ALT: 275 U/L — ABNORMAL HIGH (ref 0–53)
Alkaline Phosphatase: 178 U/L — ABNORMAL HIGH (ref 39–117)
CO2: 26 mEq/L (ref 19–32)
Chloride: 93 mEq/L — ABNORMAL LOW (ref 96–112)
GFR calc Af Amer: >90 mL/min (ref >90)
GFR calc non Af Amer: >90 mL/min (ref >90)
Glucose, Bld: 131 mg/dL — ABNORMAL HIGH (ref 70–99)
Potassium: 3.5 mEq/L (ref 3.5–5.1)
Sodium: 132 mEq/L — ABNORMAL LOW (ref 135–145)

## 2012-10-02 MED ORDER — IOHEXOL 300 MG/ML  SOLN
100.0000 mL | Freq: Once | INTRAMUSCULAR | Status: AC | PRN
  Administered 2012-10-02: 100 mL via INTRAVENOUS

## 2012-10-02 MED ORDER — FENTANYL CITRATE 0.05 MG/ML IJ SOLN
25.0000 ug | Freq: Once | INTRAMUSCULAR | Status: AC
  Administered 2012-10-02: 25 ug via NASAL
  Filled 2012-10-02: qty 2

## 2012-10-02 MED ORDER — FENTANYL CITRATE 0.05 MG/ML IJ SOLN
50.0000 ug | Freq: Once | INTRAMUSCULAR | Status: AC
  Administered 2012-10-02: 50 ug via INTRAVENOUS
  Filled 2012-10-02: qty 2

## 2012-10-02 MED ORDER — IOHEXOL 300 MG/ML  SOLN
50.0000 mL | Freq: Once | INTRAMUSCULAR | Status: AC | PRN
  Administered 2012-10-02: 50 mL via ORAL

## 2012-10-02 MED ORDER — SODIUM CHLORIDE 0.9 % IV SOLN
Freq: Once | INTRAVENOUS | Status: AC
  Administered 2012-10-02: 08:00:00 via INTRAVENOUS

## 2012-10-02 NOTE — ED Notes (Signed)
Pt escorted to discharge window. Verbalized understanding discharge instructions. In no acute distress.   

## 2012-10-02 NOTE — ED Notes (Signed)
Pain in abd and back worse since 1900. Pt uncomfortable in triage, nausea

## 2012-10-02 NOTE — ED Provider Notes (Signed)
History     CSN: [id number]  Arrival date & time 10/02/12  0220   First MD Initiated Contact with Patient 10/02/12 [phone]      Chief Complaint  Patient presents with  . Abdominal Pain    (Consider location/radiation/quality/duration/timing/severity/associated sxs/prior treatment) Patient is a 57 y.o. male presenting with abdominal pain. The history is provided by the patient (pt  complains of abd pain.  pt has a hx of chronic pain). No language interpreter was used.  Abdominal Pain This is a new problem. The current episode started 12 to 24 hours ago. The problem occurs hourly. The problem has been gradually improving. Associated symptoms include abdominal pain. Pertinent negatives include no chest pain and no headaches. Nothing aggravates the symptoms. Nothing relieves the symptoms.    Past Medical History  Diagnosis Date  . Biliary dyskinesia   . Bowel obstruction   . Ulcerative colitis   . Allergy   . Depression   . Ileostomy in place   . ED (erectile dysfunction)   . Benign positional vertigo   . Prolactinoma   . Hypogonadism male   . History of blood transfusion   . Pituitary tumor 2009  . 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 Colon surgery  2003    Colectomy with ileostomy  . Rectal surgery  2009    Resection of rectal pouch  . Knee arthroscopy    . Cholecystectomy  04/26/2012    Procedure: LAPAROSCOPIC CHOLECYSTECTOMY;  Surgeon: Romie Levee, MD;  Location: [facility];  Service: General;  Laterality: N/A;  . Eus N/A 07/05/2012    Procedure: ESOPHAGEAL ENDOSCOPIC ULTRASOUND (EUS) RADIAL;  Surgeon: Willis Modena, MD;  Location: [facility];  Service: Endoscopy;  Laterality: N/A;  celiac block  . Neurolytic celiac plexus N/A 07/05/2012    Procedure: NEUROLYTIC CELIAC PLEXUS;  Surgeon: Willis Modena, MD;  Location: [facility];  Service: Endoscopy;  Laterality: N/A;    Family History  Problem Relation Age of  Onset  . Cancer Mother     ovarian    History  Substance Use Topics  . Smoking status: Never Smoker   . Smokeless tobacco: Never Used  . Alcohol Use: No      Review of Systems  Constitutional: Negative for appetite change and fatigue.  HENT: Negative for congestion, sinus pressure and ear discharge.   Eyes: Negative for discharge.  Respiratory: Negative for cough.   Cardiovascular: Negative for chest pain.  Gastrointestinal: Positive for abdominal pain. Negative for diarrhea.  Genitourinary: Negative for frequency and hematuria.  Musculoskeletal: Negative for back pain.  Skin: Negative for rash.  Neurological: Negative for seizures and headaches.  Psychiatric/Behavioral: Negative for hallucinations.    Allergies  Codeine; Effexor; and Imuran  Home Medications   Current Outpatient Rx  Name  Route  Sig  Dispense  Refill  . acetaminophen (TYLENOL) 500 MG tablet   Oral   Take 500 mg by mouth every 4 (four) hours as needed for pain.          . cabergoline (DOSTINEX) 0.5 MG tablet   Oral   Take 0.25 mg by mouth 2 (two) times a week. Mondays and Thursdays.         . clonazePAM (KLONOPIN) 0.5 MG tablet   Oral   Take 0.5 mg by mouth 2 (two) times daily as needed for anxiety.          Marland Kitchen HYDROmorphone (DILAUDID)  4 MG tablet   Oral   Take 4 mg by mouth every 6 (six) hours as needed for pain.         Marland Kitchen ibuprofen (ADVIL,MOTRIN) 200 MG tablet   Oral   Take 400 mg by mouth every 4 (four) hours as needed for pain.          Marland Kitchen ondansetron (ZOFRAN ODT) 4 MG disintegrating tablet   Oral   Take 1 tablet (4 mg total) by mouth every 8 (eight) hours as needed for nausea.   10 tablet   0   . promethazine (PHENERGAN) 25 MG tablet   Oral   Take 25 mg by mouth every 6 (six) hours as needed for nausea.           BP 113/67  Pulse 113  Temp(Src) 98.5 F (36.9 C) (Oral)  Resp 20  SpO2 97%  Physical Exam  Constitutional: He is oriented to person, place, and time.  He appears well-developed.  HENT:  Head: Normocephalic.  Eyes: Conjunctivae and EOM are normal. No scleral icterus.  Neck: Neck supple. No thyromegaly present.  Cardiovascular: Normal rate and regular rhythm.  Exam reveals no gallop and no friction rub.   No murmur heard. Pulmonary/Chest: No stridor. He has no wheezes. He has no rales. He exhibits no tenderness.  Abdominal: He exhibits no distension. There is no tenderness. There is no rebound.  Genitourinary:  Minor tender periumbilical  Musculoskeletal: Normal range of motion. He exhibits no edema.  Lymphadenopathy:    He has no cervical adenopathy.  Neurological: He is oriented to person, place, and time. Coordination normal.  Skin: No rash noted. No erythema.  Psychiatric: He has a normal mood and affect. His behavior is normal.    ED Course  Procedures (including critical care time)  Labs Reviewed  CBC WITH DIFFERENTIAL - Abnormal; Notable for the following:    Monocytes Relative 14 (*)    All other components within normal limits  COMPREHENSIVE METABOLIC PANEL - Abnormal; Notable for the following:    Sodium 132 (*)    Chloride 93 (*)    Glucose, Bld 131 (*)    AST 170 (*)    ALT 275 (*)    Alkaline Phosphatase 178 (*)    All other components within normal limits  URINALYSIS, ROUTINE W REFLEX MICROSCOPIC - Abnormal; Notable for the following:    Ketones, ur 40 (*)    All other components within normal limits   Ct Abdomen Pelvis W Contrast  10/02/2012   *RADIOLOGY REPORT*  Clinical Data: Abdominal pain, nausea.  History of ulcerative colitis with colectomy and ileostomy.  CT ABDOMEN AND PELVIS WITH CONTRAST  Technique:  Multidetector CT imaging of the abdomen and pelvis was performed following the standard protocol during bolus administration of intravenous contrast.  Contrast: OMNIPAQUE IOHEXOL 300 MG/ML  SOLN  Comparison: 02/17/2012  Findings: Lung bases are clear.  No effusions.  Heart is normal size.  Prior  colectomy.  Right lower quadrant ileostomy.  Further progression of mesenteric masses since prior study.  Mesenteric mass on image 46 measures 3.6 x 2.1 cm.  This previously measured 2.7 x 2.5 cm.  More superiorly, abnormal soft tissue surrounds the mesenteric artery and measures 1.8 x 2.4 cm on image 39.  This was not present on prior study.  No evidence of bowel obstruction.  No free fluid or free air.  Presacral stranding again noted, stable, presumably scarring.  Liver, stomach, spleen, pancreas, adrenals and  kidneys are unremarkable.  Small cyst in the lower pole of the left kidney.  No hydronephrosis.  No acute or focal bony abnormality.  IMPRESSION: Enlarging mesenteric adenopathy, concerning for metastatic disease.   Original Report Authenticated By: Charlett Nose, M.D.     1. Abdominal pain       MDM  abd pain with elevated liver studies pt to follow up with gi this week        Benny Lennert, MD 10/02/12 [phone]

## 2013-02-15 ENCOUNTER — Other Ambulatory Visit: Payer: Self-pay

## 2013-09-09 ENCOUNTER — Encounter (HOSPITAL_COMMUNITY): Payer: Self-pay | Admitting: Emergency Medicine

## 2013-09-09 ENCOUNTER — Emergency Department (HOSPITAL_COMMUNITY): Payer: BC Managed Care – PPO

## 2013-09-09 ENCOUNTER — Inpatient Hospital Stay (HOSPITAL_COMMUNITY)
Admission: EM | Admit: 2013-09-09 | Discharge: 2013-09-19 | DRG: 377 | Disposition: A | Discharge status: Home Health | Payer: BC Managed Care – PPO | Attending: Internal Medicine | Admitting: Internal Medicine

## 2013-09-09 DIAGNOSIS — R599 Enlarged lymph nodes, unspecified: Secondary | ICD-10-CM | POA: Diagnosis present

## 2013-09-09 DIAGNOSIS — D72829 Elevated white blood cell count, unspecified: Secondary | ICD-10-CM | POA: Diagnosis present

## 2013-09-09 DIAGNOSIS — R112 Nausea with vomiting, unspecified: Secondary | ICD-10-CM | POA: Diagnosis present

## 2013-09-09 DIAGNOSIS — R1011 Right upper quadrant pain: Secondary | ICD-10-CM

## 2013-09-09 DIAGNOSIS — K264 Chronic or unspecified duodenal ulcer with hemorrhage: Principal | ICD-10-CM | POA: Diagnosis present

## 2013-09-09 DIAGNOSIS — G253 Myoclonus: Secondary | ICD-10-CM | POA: Diagnosis not present

## 2013-09-09 DIAGNOSIS — Z885 Allergy status to narcotic agent status: Secondary | ICD-10-CM

## 2013-09-09 DIAGNOSIS — R64 Cachexia: Secondary | ICD-10-CM | POA: Diagnosis present

## 2013-09-09 DIAGNOSIS — E8779 Other fluid overload: Secondary | ICD-10-CM | POA: Diagnosis not present

## 2013-09-09 DIAGNOSIS — R7309 Other abnormal glucose: Secondary | ICD-10-CM | POA: Diagnosis not present

## 2013-09-09 DIAGNOSIS — Z515 Encounter for palliative care: Secondary | ICD-10-CM

## 2013-09-09 DIAGNOSIS — K5669 Other intestinal obstruction: Secondary | ICD-10-CM | POA: Diagnosis present

## 2013-09-09 DIAGNOSIS — Z9221 Personal history of antineoplastic chemotherapy: Secondary | ICD-10-CM

## 2013-09-09 DIAGNOSIS — C799 Secondary malignant neoplasm of unspecified site: Secondary | ICD-10-CM | POA: Diagnosis present

## 2013-09-09 DIAGNOSIS — Z79899 Other long term (current) drug therapy: Secondary | ICD-10-CM

## 2013-09-09 DIAGNOSIS — K922 Gastrointestinal hemorrhage, unspecified: Secondary | ICD-10-CM | POA: Diagnosis present

## 2013-09-09 DIAGNOSIS — K260 Acute duodenal ulcer with hemorrhage: Secondary | ICD-10-CM

## 2013-09-09 DIAGNOSIS — T3995XA Adverse effect of unspecified nonopioid analgesic, antipyretic and antirheumatic, initial encounter: Secondary | ICD-10-CM | POA: Diagnosis present

## 2013-09-09 DIAGNOSIS — C20 Malignant neoplasm of rectum: Secondary | ICD-10-CM

## 2013-09-09 DIAGNOSIS — Z888 Allergy status to other drugs, medicaments and biological substances status: Secondary | ICD-10-CM

## 2013-09-09 DIAGNOSIS — K311 Adult hypertrophic pyloric stenosis: Secondary | ICD-10-CM

## 2013-09-09 DIAGNOSIS — R55 Syncope and collapse: Secondary | ICD-10-CM | POA: Diagnosis present

## 2013-09-09 DIAGNOSIS — Z932 Ileostomy status: Secondary | ICD-10-CM

## 2013-09-09 DIAGNOSIS — D62 Acute posthemorrhagic anemia: Secondary | ICD-10-CM

## 2013-09-09 DIAGNOSIS — Z933 Colostomy status: Secondary | ICD-10-CM

## 2013-09-09 DIAGNOSIS — K56609 Unspecified intestinal obstruction, unspecified as to partial versus complete obstruction: Secondary | ICD-10-CM

## 2013-09-09 DIAGNOSIS — IMO0002 Reserved for concepts with insufficient information to code with codable children: Secondary | ICD-10-CM

## 2013-09-09 DIAGNOSIS — R532 Functional quadriplegia: Secondary | ICD-10-CM

## 2013-09-09 DIAGNOSIS — T4275XA Adverse effect of unspecified antiepileptic and sedative-hypnotic drugs, initial encounter: Secondary | ICD-10-CM | POA: Diagnosis not present

## 2013-09-09 DIAGNOSIS — Z66 Do not resuscitate: Secondary | ICD-10-CM | POA: Diagnosis present

## 2013-09-09 DIAGNOSIS — E236 Other disorders of pituitary gland: Secondary | ICD-10-CM | POA: Diagnosis present

## 2013-09-09 DIAGNOSIS — K259 Gastric ulcer, unspecified as acute or chronic, without hemorrhage or perforation: Secondary | ICD-10-CM | POA: Diagnosis present

## 2013-09-09 DIAGNOSIS — R404 Transient alteration of awareness: Secondary | ICD-10-CM | POA: Diagnosis not present

## 2013-09-09 DIAGNOSIS — R000 Tachycardia, unspecified: Secondary | ICD-10-CM | POA: Diagnosis present

## 2013-09-09 DIAGNOSIS — N1330 Unspecified hydronephrosis: Secondary | ICD-10-CM | POA: Diagnosis present

## 2013-09-09 DIAGNOSIS — Z9049 Acquired absence of other specified parts of digestive tract: Secondary | ICD-10-CM

## 2013-09-09 DIAGNOSIS — R739 Hyperglycemia, unspecified: Secondary | ICD-10-CM | POA: Diagnosis present

## 2013-09-09 DIAGNOSIS — Z8711 Personal history of peptic ulcer disease: Secondary | ICD-10-CM

## 2013-09-09 DIAGNOSIS — G8929 Other chronic pain: Secondary | ICD-10-CM

## 2013-09-09 DIAGNOSIS — C786 Secondary malignant neoplasm of retroperitoneum and peritoneum: Secondary | ICD-10-CM | POA: Diagnosis present

## 2013-09-09 DIAGNOSIS — K5190 Ulcerative colitis, unspecified, without complications: Secondary | ICD-10-CM | POA: Diagnosis present

## 2013-09-09 DIAGNOSIS — E43 Unspecified severe protein-calorie malnutrition: Secondary | ICD-10-CM

## 2013-09-09 DIAGNOSIS — K912 Postsurgical malabsorption, not elsewhere classified: Secondary | ICD-10-CM | POA: Diagnosis present

## 2013-09-09 DIAGNOSIS — G893 Neoplasm related pain (acute) (chronic): Secondary | ICD-10-CM

## 2013-09-09 DIAGNOSIS — R601 Generalized edema: Secondary | ICD-10-CM

## 2013-09-09 DIAGNOSIS — K269 Duodenal ulcer, unspecified as acute or chronic, without hemorrhage or perforation: Secondary | ICD-10-CM

## 2013-09-09 DIAGNOSIS — Z791 Long term (current) use of non-steroidal anti-inflammatories (NSAID): Secondary | ICD-10-CM

## 2013-09-09 LAB — MRSA PCR SCREENING: MRSA by PCR: NEGATIVE

## 2013-09-09 LAB — CBC
HEMATOCRIT: 29.7 % — AB (ref 39.0–52.0)
Hemoglobin: 10 g/dL — ABNORMAL LOW (ref 13.0–17.0)
MCH: 31.3 pg (ref 26.0–34.0)
MCHC: 33.7 g/dL (ref 30.0–36.0)
MCV: 92.8 fL (ref 78.0–100.0)
PLATELETS: 232 10*3/uL (ref 150–400)
RBC: 3.2 MIL/uL — AB (ref 4.22–5.81)
RDW: 17.7 % — ABNORMAL HIGH (ref 11.5–15.5)
WBC: 15.6 10*3/uL — ABNORMAL HIGH (ref 4.0–10.5)

## 2013-09-09 LAB — COMPREHENSIVE METABOLIC PANEL
ALT: 88 U/L — ABNORMAL HIGH (ref 0–53)
AST: 53 U/L — ABNORMAL HIGH (ref 0–37)
Albumin: 2.2 g/dL — ABNORMAL LOW (ref 3.5–5.2)
Alkaline Phosphatase: 135 U/L — ABNORMAL HIGH (ref 39–117)
BILIRUBIN TOTAL: 0.5 mg/dL (ref 0.3–1.2)
BUN: 37 mg/dL — ABNORMAL HIGH (ref 6–23)
CHLORIDE: 101 meq/L (ref 96–112)
CO2: 24 meq/L (ref 19–32)
CREATININE: 0.81 mg/dL (ref 0.50–1.35)
Calcium: 7.8 mg/dL — ABNORMAL LOW (ref 8.4–10.5)
GFR calc Af Amer: >90 mL/min (ref >90)
Glucose, Bld: 174 mg/dL — ABNORMAL HIGH (ref 70–99)
Potassium: 4.6 mEq/L (ref 3.7–5.3)
Sodium: 135 mEq/L — ABNORMAL LOW (ref 137–147)
Total Protein: 5 g/dL — ABNORMAL LOW (ref 6.0–8.3)

## 2013-09-09 LAB — CBC WITH DIFFERENTIAL/PLATELET
Basophils Absolute: 0 10*3/uL (ref 0.0–0.1)
Basophils Relative: 0 % (ref 0–1)
Eosinophils Absolute: 0 10*3/uL (ref 0.0–0.7)
Eosinophils Relative: 0 % (ref 0–5)
HCT: 22.2 % — ABNORMAL LOW (ref 39.0–52.0)
Hemoglobin: 7.3 g/dL — ABNORMAL LOW (ref 13.0–17.0)
Lymphocytes Relative: 6 % — ABNORMAL LOW (ref 12–46)
Lymphs Abs: 2.1 10*3/uL (ref 0.7–4.0)
MCH: 33.5 pg (ref 26.0–34.0)
MCHC: 32.9 g/dL (ref 30.0–36.0)
MCV: 101.8 fL — ABNORMAL HIGH (ref 78.0–100.0)
Monocytes Absolute: 2.8 10*3/uL — ABNORMAL HIGH (ref 0.1–1.0)
Monocytes Relative: 8 % (ref 3–12)
Neutro Abs: 29.8 10*3/uL — ABNORMAL HIGH (ref 1.7–7.7)
Neutrophils Relative %: 86 % — ABNORMAL HIGH (ref 43–77)
Platelets: 325 10*3/uL (ref 150–400)
RBC: 2.18 MIL/uL — ABNORMAL LOW (ref 4.22–5.81)
RDW: 15 % (ref 11.5–15.5)
WBC: 34.7 10*3/uL — ABNORMAL HIGH (ref 4.0–10.5)

## 2013-09-09 LAB — PROTIME-INR
INR: 0.99 (ref 0.00–1.49)
PROTHROMBIN TIME: 12.9 s (ref 11.6–15.2)

## 2013-09-09 LAB — PREPARE RBC (CROSSMATCH)

## 2013-09-09 LAB — I-STAT CG4 LACTIC ACID, ED: LACTIC ACID, VENOUS: 1.56 mmol/L (ref 0.5–2.2)

## 2013-09-09 LAB — GLUCOSE, CAPILLARY: Glucose-Capillary: 144 mg/dL — ABNORMAL HIGH (ref 70–99)

## 2013-09-09 LAB — PROCALCITONIN: PROCALCITONIN: 0.2 ng/mL

## 2013-09-09 LAB — ABO/RH: ABO/RH(D): O POS

## 2013-09-09 MED ORDER — DEXTROSE-NACL 5-0.9 % IV SOLN
INTRAVENOUS | Status: AC
  Administered 2013-09-09: 16:00:00 via INTRAVENOUS

## 2013-09-09 MED ORDER — DIAZEPAM 2 MG PO TABS
2.0000 mg | ORAL_TABLET | Freq: Four times a day (QID) | ORAL | Status: DC | PRN
  Administered 2013-09-09 – 2013-09-11 (×5): 2 mg via ORAL
  Filled 2013-09-09 (×6): qty 1

## 2013-09-09 MED ORDER — DIPHENHYDRAMINE HCL 50 MG/ML IJ SOLN: 12.5000 mg | Freq: Four times a day (QID) | INTRAMUSCULAR | Status: DC | PRN

## 2013-09-09 MED ORDER — FENTANYL 100 MCG/HR TD PT72
100.0000 ug | MEDICATED_PATCH | TRANSDERMAL | Status: DC
  Administered 2013-09-11 – 2013-09-17 (×3): 100 ug via TRANSDERMAL
  Filled 2013-09-09 (×4): qty 1

## 2013-09-09 MED ORDER — FENTANYL 100 MCG/HR TD PT72: 100.0000 ug | MEDICATED_PATCH | Freq: Every day | TRANSDERMAL | Status: DC

## 2013-09-09 MED ORDER — ONDANSETRON HCL 4 MG/2ML IJ SOLN
4.0000 mg | Freq: Four times a day (QID) | INTRAMUSCULAR | Status: DC | PRN
  Administered 2013-09-09 – 2013-09-11 (×4): 4 mg via INTRAVENOUS
  Filled 2013-09-09 (×4): qty 2

## 2013-09-09 MED ORDER — CABERGOLINE 0.5 MG PO TABS
0.2500 mg | ORAL_TABLET | ORAL | Status: DC
  Administered 2013-09-10: 0.25 mg via ORAL
  Filled 2013-09-09: qty 1

## 2013-09-09 MED ORDER — ONDANSETRON 8 MG/NS 50 ML IVPB
8.0000 mg | Freq: Three times a day (TID) | INTRAVENOUS | Status: DC | PRN
  Administered 2013-09-09 – 2013-09-12 (×6): 8 mg via INTRAVENOUS
  Filled 2013-09-09 (×8): qty 8

## 2013-09-09 MED ORDER — NALOXONE HCL 0.4 MG/ML IJ SOLN: 0.4000 mg | INTRAMUSCULAR | Status: DC | PRN

## 2013-09-09 MED ORDER — DEXAMETHASONE 4 MG PO TABS
4.0000 mg | ORAL_TABLET | Freq: Every day | ORAL | Status: DC
  Administered 2013-09-10: 4 mg via ORAL
  Filled 2013-09-09 (×5): qty 1

## 2013-09-09 MED ORDER — FENTANYL 100 MCG/HR TD PT72
100.0000 ug | MEDICATED_PATCH | TRANSDERMAL | Status: DC
  Administered 2013-09-10 – 2013-09-19 (×6): 100 ug via TRANSDERMAL
  Filled 2013-09-09 (×4): qty 1

## 2013-09-09 MED ORDER — FENTANYL 100 MCG/HR TD PT72: 100.0000 ug | MEDICATED_PATCH | Freq: Once | TRANSDERMAL | Status: DC

## 2013-09-09 MED ORDER — INSULIN ASPART 100 UNIT/ML ~~LOC~~ SOLN
0.0000 [IU] | Freq: Four times a day (QID) | SUBCUTANEOUS | Status: DC
  Administered 2013-09-09: 2 [IU] via SUBCUTANEOUS
  Administered 2013-09-10 – 2013-09-11 (×6): 3 [IU] via SUBCUTANEOUS

## 2013-09-09 MED ORDER — TRACE MINERALS CR-CU-F-FE-I-MN-MO-SE-ZN IV SOLN
INTRAVENOUS | Status: AC
  Administered 2013-09-09: 20:00:00 via INTRAVENOUS
  Filled 2013-09-09: qty 2000

## 2013-09-09 MED ORDER — ACETAMINOPHEN 500 MG PO TABS
500.0000 mg | ORAL_TABLET | ORAL | Status: DC | PRN
  Administered 2013-09-09 – 2013-09-10 (×3): 500 mg via ORAL
  Filled 2013-09-09 (×3): qty 1

## 2013-09-09 MED ORDER — BIOTENE DRY MOUTH MT LIQD
15.0000 mL | Freq: Two times a day (BID) | OROMUCOSAL | Status: DC
  Administered 2013-09-10 – 2013-09-19 (×11): 15 mL via OROMUCOSAL

## 2013-09-09 MED ORDER — PROMETHAZINE HCL 25 MG PO TABS
25.0000 mg | ORAL_TABLET | Freq: Four times a day (QID) | ORAL | Status: DC | PRN
  Administered 2013-09-09 – 2013-09-10 (×2): 25 mg via ORAL
  Filled 2013-09-09 (×3): qty 1

## 2013-09-09 MED ORDER — GABAPENTIN 300 MG PO CAPS
300.0000 mg | ORAL_CAPSULE | Freq: Three times a day (TID) | ORAL | Status: DC
  Administered 2013-09-09 – 2013-09-10 (×4): 300 mg via ORAL
  Filled 2013-09-09 (×8): qty 1

## 2013-09-09 MED ORDER — FAT EMULSION 20 % IV EMUL
250.0000 mL | INTRAVENOUS | Status: AC
  Administered 2013-09-09: 250 mL via INTRAVENOUS
  Filled 2013-09-09: qty 250

## 2013-09-09 MED ORDER — SODIUM CHLORIDE 0.9 % IV SOLN
1000.0000 mL | Freq: Once | INTRAVENOUS | Status: AC
  Administered 2013-09-09: 1000 mL via INTRAVENOUS

## 2013-09-09 MED ORDER — OCTREOTIDE ACETATE 100 MCG/ML IJ SOLN
100.0000 ug | Freq: Three times a day (TID) | INTRAMUSCULAR | Status: DC
  Administered 2013-09-09 – 2013-09-13 (×11): 100 ug via INTRAVENOUS
  Filled 2013-09-09 (×19): qty 1

## 2013-09-09 MED ORDER — ONDANSETRON HCL 4 MG PO TABS
8.0000 mg | ORAL_TABLET | Freq: Three times a day (TID) | ORAL | Status: DC | PRN
  Filled 2013-09-09: qty 2

## 2013-09-09 MED ORDER — DIPHENHYDRAMINE HCL 12.5 MG/5ML PO ELIX: 12.5000 mg | ORAL_SOLUTION | Freq: Four times a day (QID) | ORAL | Status: DC | PRN

## 2013-09-09 MED ORDER — PANTOPRAZOLE SODIUM 40 MG IV SOLR
40.0000 mg | Freq: Two times a day (BID) | INTRAVENOUS | Status: DC
  Administered 2013-09-09 – 2013-09-10 (×2): 40 mg via INTRAVENOUS
  Filled 2013-09-09 (×3): qty 40

## 2013-09-09 MED ORDER — TAMSULOSIN HCL 0.4 MG PO CAPS
0.4000 mg | ORAL_CAPSULE | Freq: Every day | ORAL | Status: DC
  Administered 2013-09-09: 0.4 mg via ORAL
  Filled 2013-09-09 (×4): qty 1

## 2013-09-09 MED ORDER — HYDROMORPHONE 0.3 MG/ML IV SOLN
INTRAVENOUS | Status: DC
  Administered 2013-09-09: 1 mg via INTRAVENOUS
  Administered 2013-09-09: 21:00:00 via INTRAVENOUS
  Administered 2013-09-09: 13.32 mg via INTRAVENOUS
  Administered 2013-09-09: 15.11 mg via INTRAVENOUS
  Administered 2013-09-09: 15:00:00 via INTRAVENOUS
  Administered 2013-09-10: 11.27 mg via INTRAVENOUS
  Administered 2013-09-10: 10.62 mg via INTRAVENOUS
  Administered 2013-09-10: via INTRAVENOUS
  Administered 2013-09-10: 11.48 mg via INTRAVENOUS
  Administered 2013-09-10: 14:00:00 via INTRAVENOUS
  Administered 2013-09-10: 3.99 mg via INTRAVENOUS
  Administered 2013-09-10 (×3): via INTRAVENOUS
  Administered 2013-09-10: 8.14 mg via INTRAVENOUS
  Administered 2013-09-10 – 2013-09-11 (×7): via INTRAVENOUS
  Administered 2013-09-11: 26.61 mg via INTRAVENOUS
  Administered 2013-09-11: 5.62 mg via INTRAVENOUS
  Administered 2013-09-11: 5.99 mg via INTRAVENOUS
  Administered 2013-09-11 (×3): via INTRAVENOUS
  Administered 2013-09-11: 12.55 mg via INTRAVENOUS
  Administered 2013-09-12: 15.77 mg via INTRAVENOUS
  Administered 2013-09-12 (×2): via INTRAVENOUS
  Administered 2013-09-12: 4.99 mg via INTRAVENOUS
  Administered 2013-09-12: 7.5 mg via INTRAVENOUS
  Filled 2013-09-09 (×23): qty 25

## 2013-09-09 MED ORDER — SODIUM CHLORIDE 0.9 % IJ SOLN: 9.0000 mL | INTRAMUSCULAR | Status: DC | PRN

## 2013-09-09 MED ORDER — SODIUM CHLORIDE 0.9 % IV SOLN
1000.0000 mL | INTRAVENOUS | Status: DC
  Administered 2013-09-09: 1000 mL via INTRAVENOUS

## 2013-09-09 MED ORDER — CYCLOBENZAPRINE HCL 10 MG PO TABS
10.0000 mg | ORAL_TABLET | Freq: Two times a day (BID) | ORAL | Status: DC | PRN
  Administered 2013-09-09 – 2013-09-10 (×2): 10 mg via ORAL
  Filled 2013-09-09 (×3): qty 1

## 2013-09-09 MED ORDER — DEXTROSE-NACL 5-0.9 % IV SOLN
INTRAVENOUS | Status: DC
  Administered 2013-09-09: 18:00:00 via INTRAVENOUS

## 2013-09-09 MED ORDER — CHLORHEXIDINE GLUCONATE 0.12 % MT SOLN
15.0000 mL | Freq: Two times a day (BID) | OROMUCOSAL | Status: DC
  Administered 2013-09-09 – 2013-09-19 (×13): 15 mL via OROMUCOSAL
  Filled 2013-09-09 (×19): qty 15

## 2013-09-09 MED ORDER — PROCHLORPERAZINE MALEATE 10 MG PO TABS
10.0000 mg | ORAL_TABLET | Freq: Three times a day (TID) | ORAL | Status: DC | PRN
  Administered 2013-09-09 – 2013-09-11 (×3): 10 mg via ORAL
  Filled 2013-09-09 (×6): qty 1

## 2013-09-09 MED ORDER — FENTANYL 100 MCG/HR TD PT72
100.0000 ug | MEDICATED_PATCH | TRANSDERMAL | Status: DC
  Administered 2013-09-09 – 2013-09-15 (×3): 100 ug via TRANSDERMAL
  Filled 2013-09-09 (×4): qty 1

## 2013-09-09 NOTE — ED Notes (Signed)
Per EMS-pt c/o of syncope while on toilet. Hx of cancer. 3 fent patches on. On dilaudid pump. Vomiting blood. Hypotensive.

## 2013-09-09 NOTE — ED Notes (Signed)
Colonostopy bag was emptyed with me and Tim. Urinal was given and he will try to urinate soon.

## 2013-09-09 NOTE — ED Notes (Signed)
Patient is unable to urinate 

## 2013-09-09 NOTE — Progress Notes (Signed)
PARENTERAL NUTRITION CONSULT NOTE - INITIAL  Pharmacy Consult for TPN Indication: Short bowel d/t rectal cancer  Allergies  Allergen Reactions  . Codeine Nausea And Vomiting  . Effexor [Venlafaxine Hcl] Other (See Comments)    Flat Feeling  . Imuran [Azathioprine] Nausea And Vomiting    Patient Measurements: Height: 5\' 9"  (175.3 cm) Weight: 157 lb 13.6 oz (71.6 kg) IBW/kg (Calculated) : 70.7  Vital Signs: Temp: 97.9 F (36.6 C) (05/31 1331) Temp src: Axillary (05/31 1331) BP: 134/87 mmHg (05/31 1331) Pulse Rate: 116 (05/31 1331) Intake/Output from previous day:   Intake/Output from this shift: Total I/O In: 747.9 [I.V.:435.4; Blood:312.5] Out: 200 [Stool:200]  Labs:  Recent Labs  09/09/13 0845  WBC 34.7*  HGB 7.3*  HCT 22.2*  PLT 325  INR 0.99     Recent Labs  09/09/13 0845  NA 135*  K 4.6  CL 101  CO2 24  GLUCOSE 174*  BUN 37*  CREATININE 0.81  CALCIUM 7.8*  PROT 5.0*  ALBUMIN 2.2*  AST 53*  ALT 88*  ALKPHOS 135*  BILITOT 0.5   Estimated Creatinine Clearance: 100.6 ml/min (by C-G formula based on Cr of 0.81).   No results found for this basename: GLUCAP,  in the last 72 hours  Medical History: Past Medical History  Diagnosis Date  . Biliary dyskinesia   . Bowel obstruction   . Ulcerative colitis   . Allergy   . Depression   . Ileostomy in place   . ED (erectile dysfunction)   . Benign positional vertigo   . Prolactinoma   . Hypogonadism male   . History of blood transfusion   . Pituitary tumor 2009  . Cancer 2009    Rectal/ rectal pouch    Medications:  Scheduled:  . [START ON 09/10/2013] cabergoline  0.25 mg Oral Once per day on Mon Thu  . [START ON 09/10/2013] dexamethasone  4 mg Oral Q breakfast  . fentaNYL  100 mcg Transdermal Q72H  . [START ON 09/10/2013] fentaNYL  100 mcg Transdermal Q72H  . [START ON 09/11/2013] fentaNYL  100 mcg Transdermal Q72H  . fentaNYL  100 mcg Transdermal Once  . fentaNYL  100 mcg Transdermal Once  .  gabapentin  300 mg Oral TID  . HYDROmorphone PCA 0.3 mg/mL   Intravenous 6 times per day  . octreotide  100 mcg Intravenous 3 times per day  . tamsulosin  0.4 mg Oral QHS   Infusions:  . dextrose 5 % and 0.9% NaCl      Insulin Requirements in the past 24 hours:  None  Current Nutrition:  TPN PTA: Cyclic YQIHKVQQ(5Z-5G), 2544ml delivered. Bag includes lipids one day a week. Daily avg: 1824Kcal and 96g protein. Standard MVI/TE. No insulin. Octreotide 382mcg/bag.  IVF:   Assessment: [name] w/ metastatic rectal cancer on TPN at home, admitted w/ syncope and vomiting blood. Also on an ambulatory Dilaudid PCA and getting IV chemotherapy at home via [facility]. Managed by MDs at [facility] is tx to [facility] when stabilized. Recently was considering hospice. Pharmacy is asked to dose TPN while inpatient.    Glucose - 174 on admission  Electrolytes - Na slightly low. Corr Ca 9.2.  LFTs -   TGs -  Prealbumin -  Nutritional Goals:  F/u RD assessment. For now, provide similar nutrition but via continuous infusion d/t acute illness: Clinimix E 5/15 at a goal rate of 35ml/hr + 20% fat emulsion at 71ml/hr to provide: 96g/day protein, 1824Kcal/day.  TPN  Access: PICC TPN day#: 1(inpatient)  Plan: At 1800 today:  Start Clinimix E 5/15 at 29ml/hr.  20% fat emulsion at 38ml/hr.  TNA to contain standard multivitamins and trace elements.  Reduce IVF to [facility], 39ml/hr.  Add moderate SSI q6h.   TNA lab panels on Mondays & Thursdays.  F/u daily.  Romeo Rabon, PharmD, pager [phone]. 09/09/2013,1:58 PM.

## 2013-09-09 NOTE — ED Notes (Signed)
Bed: XI35 Expected date:  Expected time:  Means of arrival:  Comments: EMS/Syncope

## 2013-09-09 NOTE — ED Provider Notes (Signed)
CSN: [id number]     Arrival date & time 09/09/13  6283 History   First MD Initiated Contact with Patient 09/09/13 0800     Chief Complaint  Patient presents with  . Loss of Consciousness     (Consider location/radiation/quality/duration/timing/severity/associated sxs/prior Treatment) HPI Patient presents after a possible syncope event. Patient describes syncope initially, but on clarification seems to have had acute weakness, while using the toilet. Patient denies head trauma, subsequent confusion, disorientation, vomiting. He does describe pervasive weakness particularly over the past few is. He is scheduled to resume chemotherapy tomorrow, course of 14 of a planned 15 cycles of chemotherapy for metastatic rectal cancer. Patient receives cancer therapy at [facility]. Patient has a notable history of prior colectomy, prior chemotherapy, radiation therapy, has 2 ostomies. . He notes that he has had more gas production in his ostomy bags. Soon after my initial evaluation the patient also complains of new blood in his ostomy bags.  Past Medical History  Diagnosis Date  . Biliary dyskinesia   . Bowel obstruction   . Ulcerative colitis   . Allergy   . Depression   . Ileostomy in place   . ED (erectile dysfunction)   . Benign positional vertigo   . Prolactinoma   . Hypogonadism male   . History of blood transfusion   . Pituitary tumor 2009  . 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 Colon surgery  2003    Colectomy with ileostomy  . Rectal surgery  2009    Resection of rectal pouch  . Knee arthroscopy    . Cholecystectomy  04/26/2012    Procedure: LAPAROSCOPIC CHOLECYSTECTOMY;  Surgeon: Leighton Ruff, MD;  Location: [facility];  Service: General;  Laterality: N/A;  . Eus N/A 07/05/2012    Procedure: ESOPHAGEAL ENDOSCOPIC ULTRASOUND (EUS) RADIAL;  Surgeon: Arta Silence, MD;  Location: [facility];  Service: Endoscopy;   Laterality: N/A;  celiac block  . Neurolytic celiac plexus N/A 07/05/2012    Procedure: NEUROLYTIC CELIAC PLEXUS;  Surgeon: Arta Silence, MD;  Location: [facility];  Service: Endoscopy;  Laterality: N/A;   Family History  Problem Relation Age of Onset  . Cancer Mother     ovarian   History  Substance Use Topics  . Smoking status: Never Smoker   . Smokeless tobacco: Never Used  . Alcohol Use: No    Review of Systems  Constitutional:       Per HPI, otherwise negative  HENT:       Per HPI, otherwise negative  Respiratory:       Per HPI, otherwise negative  Cardiovascular:       Per HPI, otherwise negative  Gastrointestinal: Negative for vomiting.  Endocrine:       Negative aside from HPI  Genitourinary:       Neg aside from HPI   Musculoskeletal:       Per HPI, otherwise negative  Skin: Positive for color change and pallor.  Neurological: Positive for weakness and light-headedness. Negative for syncope.      Allergies  Codeine; Effexor; and Imuran  Home Medications   Prior to Admission medications   Medication Sig Start Date End Date Taking? Authorizing Provider  acetaminophen (TYLENOL) 500 MG tablet Take 500 mg by mouth every 4 (four) hours as needed for pain.    Yes Historical Provider, MD  cabergoline (DOSTINEX) 0.5 MG tablet Take 0.25 mg by mouth 2 (two) times  a week. Mondays and Thursdays.   Yes Historical Provider, MD  clonazePAM (KLONOPIN) 0.5 MG tablet Take 0.5 mg by mouth 3 (three) times daily as needed for anxiety.    Yes Historical Provider, MD  cyclobenzaprine (FLEXERIL) 10 MG tablet Take 10 mg by mouth 2 (two) times daily as needed for muscle spasms.   Yes Historical Provider, MD  dexamethasone (DECADRON) 4 MG tablet Take 4 mg by mouth daily with breakfast.   Yes Historical Provider, MD  diazepam (VALIUM) 2 MG tablet Take 2 mg by mouth every 6 (six) hours as needed for anxiety.   Yes Historical Provider, MD  fentaNYL (DURAGESIC - DOSED MCG/HR) 100  MCG/HR Place 100 mcg onto the skin daily.    Yes Historical Provider, MD  gabapentin (NEURONTIN) 300 MG capsule Take 300 mg by mouth 3 (three) times daily.   Yes Historical Provider, MD  HYDROmorphone HCl (DILAUDID IJ) Inject as directed.   Yes Historical Provider, MD  ibuprofen (ADVIL,MOTRIN) 200 MG tablet Take 400 mg by mouth every 4 (four) hours as needed for pain.    Yes Historical Provider, MD  ondansetron (ZOFRAN-ODT) 8 MG disintegrating tablet Take 8 mg by mouth every 8 (eight) hours as needed for nausea or vomiting.   Yes Historical Provider, MD  PRESCRIPTION MEDICATION    Yes Historical Provider, MD  PRESCRIPTION MEDICATION    Yes Historical Provider, MD  prochlorperazine (COMPAZINE) 10 MG tablet Take 10 mg by mouth every 8 (eight) hours as needed for nausea or vomiting.   Yes Historical Provider, MD  promethazine (PHENERGAN) 25 MG tablet Take 25 mg by mouth every 6 (six) hours as needed for nausea.   Yes Historical Provider, MD  tamsulosin (FLOMAX) 0.4 MG CAPS capsule Take 0.4 mg by mouth at bedtime.   Yes Historical Provider, MD   BP 94/63  Pulse 110  Temp(Src) 98.9 F (37.2 C) (Oral)  Resp 17  SpO2 100% Physical Exam  Nursing note and vitals reviewed. Constitutional: He is oriented to person, place, and time. He appears cachectic. He has a sickly appearance.    HENT:  Head: Normocephalic and atraumatic.  Eyes: Conjunctivae and EOM are normal.  Cardiovascular: Regular rhythm.  Tachycardia present.   Pulmonary/Chest: Effort normal. No stridor. No respiratory distress.  Abdominal: He exhibits no distension.  Musculoskeletal: He exhibits no edema.  Neurological: He is oriented to person, place, and time.  Skin: Skin is warm and dry.  Psychiatric: He has a normal mood and affect.    ED Course  Procedures (including critical care time) Labs Review Labs Reviewed  CBC WITH DIFFERENTIAL - Abnormal; Notable for the following:    WBC 34.7 (*)    RBC 2.18 (*)    Hemoglobin  7.3 (*)    HCT 22.2 (*)    MCV 101.8 (*)    All other components within normal limits  CULTURE, BLOOD (ROUTINE X 2)  CULTURE, BLOOD (ROUTINE X 2)  URINE CULTURE  COMPREHENSIVE METABOLIC PANEL  URINALYSIS, ROUTINE W REFLEX MICROSCOPIC  PROCALCITONIN  PROTIME-INR  I-STAT CG4 LACTIC ACID, ED    Imaging Review Dg Chest Port 1 View  09/09/2013   CLINICAL DATA:  Weakness and nausea  EXAM: PORTABLE CHEST - 1 VIEW  COMPARISON:  Length 10/21/2007  FINDINGS: Dual lumen catheter is now seen on the right with the tip at the cavoatrial junction. The cardiac shadow is within normal limits. The lungs are clear. No bony abnormality is noted.  IMPRESSION: No active disease.  Electronically Signed   By: Inez Catalina M.D.   On: 09/09/2013 09:04   Cardiac monitor shows rate 111, sinus tach abnormal  After the initial evaluation I reviewed the patient's charts, both here and at his oncologic facility   Below is a copied section of the most recent note from the patient's [facility] team (from recent outpatient visit)  CT CAP w/ contrast April 2015 CONCLUSION:  1. Similar to slight increase in size of infiltrative tumor within the central mesentery, involving numerous vascular structures, as described above. There does appear to be progressive mass effect upon the third portion of the duodenum, with increasing proximal gastric and duodenal luminal distention. Slight increase in discrete central mesenteric lymph nodes.  2. The aforementioned infiltrative tumor results in bilateral ureteral strictures, with persistent bilateral hydroureteronephrosis. Progressive atrophy of the right renal parenchymal, with asymmetric decreased enhancement.  3. Tree-in-bud micronodularity has developed in the left lower lobe. Findings are not typical for metastatic disease, and would favor small airways/infectious disease etiology. Clinical correlation is advised.  4. Limited pelvic venous assessment given the contrast bolus timing.  Likely contrast mixing in the femoral veins bilaterally, though correlation with patient symptoms is advised to exclude DVT.  5. Multiple additional findings, including extensive postsurgical changes, as noted above.  IMPRESSION/PLAN:  Metastatic rectal cancer: Mr. Danzer is s/p cycle 14 of 5-FU at $Remov'2400mg'xplywA$ /m2 administered over 46 hours and tolerated adequately. Labs and symptoms are stable. We will proceed with C15 at $Remo'2400mg'VOxAk$ /m2, which is 4130 mg over 46 hours. Orders/rx will be sent to his home health agency. At progression will consider hospice versus reassessment for metastatic biopsy for kras status.   B/l hydronephrosis: Kidney function remains stable despite tumor-related hydroureteronephrosis and we will monitor. No intervention at this time.   Pain, cancer-related: Continue fentanyl patches 300 mcq q3 days (3, 115mcg patches and he changes one patch each day) and dilaudid PCA pump (22ml q10 min) which is monitored by Dr. Marshell Levan. Actiq also available prn. He also remains on decadron $RemoveBef'4mg'tbTjEdQluu$  daily and flexeril prn.   Anxiety: Currently on Klonopin and also has Diazepam as needed. He does see Dr Deitra Mayo in the next week and may discuss further management of his mood.  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 He continues some oral intake as well. We will continue to monitor his labs.   Appreciate involvement of Dr. Deitra Mayo in [place].  RTC in 2 weeks for re-evaluation and labs prior to cycle 16.   Electronically signed by: Lawerance Sabal, MD 09/05/2013 5:26 PM     Update: On re-exam the patient appears ill. He, his father and I had a lengthy conversation about hospice care, continued chemotherapy efforts and his new anemia with presumed blood loss via progression of his infiltrative tumors into his mesenteric vasculature. Patient requests admission here rather than the transfer with initiation of blood transfusion here.   MDM   Is very  ill-appearing male with progressive rectal carcinoma, cachexia now presents with weakness, near syncope and his son has new anemia presumably from progression of his infiltrative carcinoma.  Patient is oriented appropriately, has significantly abnormal labs, requiring admission, transfusion.   CRITICAL CARE Performed by: Carmin Muskrat Total critical care time: 50 Critical care time was exclusive of separately billable procedures and treating other patients. Critical care was necessary to treat or prevent imminent or life-threatening deterioration. Critical care was time spent personally by me on the following activities: development of treatment  plan with patient and/or surrogate as well as nursing, discussions with consultants, evaluation of patient's response to treatment, examination of patient, obtaining history from patient or surrogate, ordering and performing treatments and interventions, ordering and review of laboratory studies, ordering and review of radiographic studies, pulse oximetry and re-evaluation of patient's condition.     Carmin Muskrat, MD 09/09/13 [phone]

## 2013-09-09 NOTE — H&P (Signed)
[facility] History and Physical  Brandon [name] BDZ:[id number] DOB: May 17, 1955 DOA: 09/09/2013  Referring physician: Dr. Vanita Panda PCP: [name], MD   Chief Complaint: syncope  HPI: Brandon [name] is a 57 y.o. male  With history of metastatic rectal cancer who is status post cycle 14 of 5-FU with last chemotherapy administered reportedly last week.  Patient states that he has been having some bloody emesis and nursing staff reports noting blood in his colostomy bag. He reports having GI operation in [facility] clinic for relief of abdominal discomfort related to metastatic rectal cancer. Patient 4 nutrition is getting TPN due to surgical bypass and resultant short gut he does however take some oral intake.  He is followed by Dr. Dorthula Matas at [facility].  We discussed transitioning patient from the ED to  [facility] but patient preferred to have blood transfusions here before considering transition to [facility]. Based on note from the oncologist they were considering hospice versus further evaluation based on patient's condition on f/u with oncologist.  In the ED patient was found to have a hemoglobin of 7.3, elevated white blood cell count of 34.7 with no fevers, and nursing reported some blood in ostomy bag.   Review of Systems:  Constitutional:  + weight loss, Denies night sweats, Fevers, chills, fatigue.  HEENT:  No headaches, Difficulty swallowing,Tooth/dental problems,Sore throat,  No sneezing, itching, ear ache, nasal congestion, post nasal drip,  Cardio-vascular:  No chest pain, Orthopnea, PND, swelling in lower extremities, anasarca, dizziness, palpitations  GI:  No heartburn, indigestion, abdominal pain, nausea, + vomiting, diarrhea, change in bowel habits, loss of appetite  Resp:  No shortness of breath with exertion or at rest. No excess mucus, no productive cough, No non-productive cough, No coughing up of blood.No change in color of mucus.No wheezing.No chest wall  deformity  Skin:  no rash or lesions.  GU:  no dysuria, change in color of urine, no urgency or frequency. No flank pain.  Musculoskeletal:  No joint pain or swelling. No decreased range of motion. No back pain.  Psych:  No change in mood or affect. No depression or anxiety. No memory loss.   Past Medical History  Diagnosis Date  . Biliary dyskinesia   . Bowel obstruction   . Ulcerative colitis   . Allergy   . Depression   . Ileostomy in place   . ED (erectile dysfunction)   . Benign positional vertigo   . Prolactinoma   . Hypogonadism male   . History of blood transfusion   . Pituitary tumor 2009  . 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 Colon surgery  2003    Colectomy with ileostomy  . Rectal surgery  2009    Resection of rectal pouch  . Knee arthroscopy    . Cholecystectomy  04/26/2012    Procedure: LAPAROSCOPIC CHOLECYSTECTOMY;  Surgeon: Leighton Ruff, MD;  Location: [facility];  Service: General;  Laterality: N/A;  . Eus N/A 07/05/2012    Procedure: ESOPHAGEAL ENDOSCOPIC ULTRASOUND (EUS) RADIAL;  Surgeon: Arta Silence, MD;  Location: [facility];  Service: Endoscopy;  Laterality: N/A;  celiac block  . Neurolytic celiac plexus N/A 07/05/2012    Procedure: NEUROLYTIC CELIAC PLEXUS;  Surgeon: Arta Silence, MD;  Location: [facility];  Service: Endoscopy;  Laterality: N/A;   Social History:  reports that he has never smoked. He has never used smokeless tobacco. He reports that he does not drink alcohol  or use illicit drugs.  Allergies  Allergen Reactions  . Codeine Nausea And Vomiting  . Effexor [Venlafaxine Hcl] Other (See Comments)    Flat Feeling  . Imuran [Azathioprine] Nausea And Vomiting    Family History  Problem Relation Age of Onset  . Cancer Mother     ovarian     Prior to Admission medications   Medication Sig Start Date End Date Taking? Authorizing Provider  acetaminophen (TYLENOL) 500 MG tablet  Take 500 mg by mouth every 4 (four) hours as needed for pain.    Yes Historical Provider, MD  cabergoline (DOSTINEX) 0.5 MG tablet Take 0.25 mg by mouth 2 (two) times a week. Mondays and Thursdays.   Yes Historical Provider, MD  clonazePAM (KLONOPIN) 0.5 MG tablet Take 0.5 mg by mouth 3 (three) times daily as needed for anxiety.    Yes Historical Provider, MD  cyclobenzaprine (FLEXERIL) 10 MG tablet Take 10 mg by mouth 2 (two) times daily as needed for muscle spasms.   Yes Historical Provider, MD  dexamethasone (DECADRON) 4 MG tablet Take 4 mg by mouth daily with breakfast.   Yes Historical Provider, MD  diazepam (VALIUM) 2 MG tablet Take 2 mg by mouth every 6 (six) hours as needed for anxiety.   Yes Historical Provider, MD  fentaNYL (DURAGESIC - DOSED MCG/HR) 100 MCG/HR Place 100 mcg onto the skin daily.    Yes Historical Provider, MD  gabapentin (NEURONTIN) 300 MG capsule Take 300 mg by mouth 3 (three) times daily.   Yes Historical Provider, MD  HYDROmorphone HCl (DILAUDID IJ) Inject as directed.   Yes Historical Provider, MD  ibuprofen (ADVIL,MOTRIN) 200 MG tablet Take 400 mg by mouth every 4 (four) hours as needed for pain.    Yes Historical Provider, MD  ondansetron (ZOFRAN-ODT) 8 MG disintegrating tablet Take 8 mg by mouth every 8 (eight) hours as needed for nausea or vomiting.   Yes Historical Provider, MD  PRESCRIPTION MEDICATION    Yes Historical Provider, MD  PRESCRIPTION MEDICATION    Yes Historical Provider, MD  prochlorperazine (COMPAZINE) 10 MG tablet Take 10 mg by mouth every 8 (eight) hours as needed for nausea or vomiting.   Yes Historical Provider, MD  promethazine (PHENERGAN) 25 MG tablet Take 25 mg by mouth every 6 (six) hours as needed for nausea.   Yes Historical Provider, MD  tamsulosin (FLOMAX) 0.4 MG CAPS capsule Take 0.4 mg by mouth at bedtime.   Yes Historical Provider, MD   Physical Exam: Filed Vitals:   09/09/13 1000  BP: 107/74  Pulse:   Temp:   Resp: 21    BP  107/74  Pulse 110  Temp(Src) 98.9 F (37.2 C) (Oral)  Resp 21  SpO2 100%  General:  Appears calm and comfortable Eyes: PERRL, normal lids, pale conjunctiva ENT: grossly normal hearing, lips & tongue Neck: no LAD, masses or thyromegaly Cardiovascular: RRR, no m/r/g. Respiratory: CTA bilaterally, no w/r/r. Normal respiratory effort. Abdomen: soft, ND, NT Skin: no rash or induration seen on limited exam Musculoskeletal: grossly normal tone BUE/BLE Psychiatric: grossly normal mood and affect, speech fluent and appropriate Neurologic: no facial asymmetry, moves all extremities equally          Labs on Admission:  Basic Metabolic Panel:  Recent Labs Lab 09/09/13 0845  NA 135*  K 4.6  CL 101  CO2 24  GLUCOSE 174*  BUN 37*  CREATININE 0.81  CALCIUM 7.8*   Liver Function Tests:  Recent Labs Lab 09/09/13  0845  AST 53*  ALT 88*  ALKPHOS 135*  BILITOT 0.5  PROT 5.0*  ALBUMIN 2.2*   No results found for this basename: LIPASE, AMYLASE,  in the last 168 hours No results found for this basename: AMMONIA,  in the last 168 hours CBC:  Recent Labs Lab 09/09/13 0845  WBC 34.7*  NEUTROABS 29.8*  HGB 7.3*  HCT 22.2*  MCV 101.8*  PLT 325   Cardiac Enzymes: No results found for this basename: CKTOTAL, CKMB, CKMBINDEX, TROPONINI,  in the last 168 hours  BNP (last 3 results) No results found for this basename: PROBNP,  in the last 8760 hours CBG: No results found for this basename: GLUCAP,  in the last 168 hours  Radiological Exams on Admission: Dg Chest Port 1 View  09/09/2013   CLINICAL DATA:  Weakness and nausea  EXAM: PORTABLE CHEST - 1 VIEW  COMPARISON:  Length 10/21/2007  FINDINGS: Dual lumen catheter is now seen on the right with the tip at the cavoatrial junction. The cardiac shadow is within normal limits. The lungs are clear. No bony abnormality is noted.  IMPRESSION: No active disease.   Electronically Signed   By: Inez Catalina M.D.   On: 09/09/2013 09:04      Assessment/Plan Active Problems:   Anemia associated with acute blood loss - Plan will be to transfuse 2 units of packed red blood cells - Most likely secondary to GI bleed. We'll place in step down for closer monitoring. -Most likely cause of near syncope  GI bleed - Patient had NSAID listed on home med rec - Plan will be to monitor patient closely. Patient has been seen by Dr. Paulita Fujita in the past. Does not seem like a good candidate for any procedural type of intervention. If bleeding. GI tract continues may consider contacting GI for further recommendations.  Metastatic carcinoma/rectal cancer - After improvement in condition patient to continue followup with his oncologist.   Code Status: DNR Family Communication: discussed with patient and significant other at bedside. Disposition Plan: transfusion of PRBC's in stepdown. With improvement in condition may consider tx to [facility] vs setting up hospice while here.  Time spent: > 60 minutes  [facility] Pager [phone]  **Disclaimer: This note may have been dictated with voice recognition software. Similar sounding words can inadvertently be transcribed and this note may contain transcription errors which may not have been corrected upon publication of note.**

## 2013-09-09 NOTE — ED Notes (Signed)
Patient is unable to urinate, doctor and nurse is aware.

## 2013-09-10 ENCOUNTER — Encounter (HOSPITAL_COMMUNITY)
Admission: EM | Disposition: A | Discharge status: Home Health | Payer: BC Managed Care – PPO | Source: Home / Self Care | Attending: Internal Medicine

## 2013-09-10 ENCOUNTER — Encounter (HOSPITAL_COMMUNITY): Payer: Self-pay | Admitting: *Deleted

## 2013-09-10 HISTORY — PX: ESOPHAGOGASTRODUODENOSCOPY: SHX5428

## 2013-09-10 LAB — URINALYSIS, ROUTINE W REFLEX MICROSCOPIC
Bilirubin Urine: NEGATIVE
Glucose, UA: NEGATIVE mg/dL
Hgb urine dipstick: NEGATIVE
KETONES UR: NEGATIVE mg/dL
Leukocytes, UA: NEGATIVE
NITRITE: NEGATIVE
PROTEIN: NEGATIVE mg/dL
Specific Gravity, Urine: 1.024 (ref 1.005–1.030)
UROBILINOGEN UA: 0.2 mg/dL (ref 0.0–1.0)
pH: 6 (ref 5.0–8.0)

## 2013-09-10 LAB — GLUCOSE, CAPILLARY
Glucose-Capillary: 169 mg/dL — ABNORMAL HIGH (ref 70–99)
Glucose-Capillary: 154 mg/dL — ABNORMAL HIGH (ref 70–99)
Glucose-Capillary: 182 mg/dL — ABNORMAL HIGH (ref 70–99)
Glucose-Capillary: 174 mg/dL — ABNORMAL HIGH (ref 70–99)

## 2013-09-10 LAB — CBC
HCT: 28.2 % — ABNORMAL LOW (ref 39.0–52.0)
HCT: 27.5 % — ABNORMAL LOW (ref 39.0–52.0)
HEMATOCRIT: 23 % — AB (ref 39.0–52.0)
HEMOGLOBIN: 9.3 g/dL — AB (ref 13.0–17.0)
Hemoglobin: 9.2 g/dL — ABNORMAL LOW (ref 13.0–17.0)
Hemoglobin: 7.9 g/dL — ABNORMAL LOW (ref 13.0–17.0)
MCH: 30.5 pg (ref 26.0–34.0)
MCH: 31.2 pg (ref 26.0–34.0)
MCH: 31.9 pg (ref 26.0–34.0)
MCHC: 32.6 g/dL (ref 30.0–36.0)
MCHC: 33.8 g/dL (ref 30.0–36.0)
MCHC: 34.3 g/dL (ref 30.0–36.0)
MCV: 93.4 fL (ref 78.0–100.0)
MCV: 92.3 fL (ref 78.0–100.0)
MCV: 92.7 fL (ref 78.0–100.0)
PLATELETS: 232 10*3/uL (ref 150–400)
Platelets: 219 10*3/uL (ref 150–400)
Platelets: 220 10*3/uL (ref 150–400)
RBC: 3.02 MIL/uL — ABNORMAL LOW (ref 4.22–5.81)
RBC: 2.98 MIL/uL — AB (ref 4.22–5.81)
RBC: 2.48 MIL/uL — ABNORMAL LOW (ref 4.22–5.81)
RDW: 18.2 % — AB (ref 11.5–15.5)
RDW: 17.9 % — ABNORMAL HIGH (ref 11.5–15.5)
RDW: 17.3 % — AB (ref 11.5–15.5)
WBC: 14 10*3/uL — ABNORMAL HIGH (ref 4.0–10.5)
WBC: 12.2 10*3/uL — ABNORMAL HIGH (ref 4.0–10.5)
WBC: 15 10*3/uL — ABNORMAL HIGH (ref 4.0–10.5)

## 2013-09-10 LAB — DIFFERENTIAL
BASOS ABS: 0 10*3/uL (ref 0.0–0.1)
Basophils Relative: 0 % (ref 0–1)
Eosinophils Absolute: 0.1 10*3/uL (ref 0.0–0.7)
Eosinophils Relative: 1 % (ref 0–5)
LYMPHS ABS: 1.7 10*3/uL (ref 0.7–4.0)
LYMPHS PCT: 12 % (ref 12–46)
Monocytes Absolute: 1.3 10*3/uL — ABNORMAL HIGH (ref 0.1–1.0)
Monocytes Relative: 9 % (ref 3–12)
Neutro Abs: 10.9 10*3/uL — ABNORMAL HIGH (ref 1.7–7.7)
Neutrophils Relative %: 78 % — ABNORMAL HIGH (ref 43–77)

## 2013-09-10 LAB — COMPREHENSIVE METABOLIC PANEL
ALK PHOS: 113 U/L (ref 39–117)
ALT: 60 U/L — AB (ref 0–53)
AST: 15 U/L (ref 0–37)
Albumin: 2.2 g/dL — ABNORMAL LOW (ref 3.5–5.2)
BUN: 30 mg/dL — ABNORMAL HIGH (ref 6–23)
CO2: 28 mEq/L (ref 19–32)
Calcium: 7.7 mg/dL — ABNORMAL LOW (ref 8.4–10.5)
Chloride: 100 mEq/L (ref 96–112)
Creatinine, Ser: 0.7 mg/dL (ref 0.50–1.35)
GFR calc Af Amer: >90 mL/min (ref >90)
GFR calc non Af Amer: >90 mL/min (ref >90)
Glucose, Bld: 146 mg/dL — ABNORMAL HIGH (ref 70–99)
Potassium: 4.3 mEq/L (ref 3.7–5.3)
SODIUM: 137 meq/L (ref 137–147)
TOTAL PROTEIN: 5.1 g/dL — AB (ref 6.0–8.3)
Total Bilirubin: 0.3 mg/dL (ref 0.3–1.2)

## 2013-09-10 LAB — PHOSPHORUS: Phosphorus: 3.3 mg/dL (ref 2.3–4.6)

## 2013-09-10 LAB — TRIGLYCERIDES: Triglycerides: 163 mg/dL — ABNORMAL HIGH (ref <150)

## 2013-09-10 LAB — PREALBUMIN: Prealbumin: 23.1 mg/dL (ref 17.0–34.0)

## 2013-09-10 LAB — MAGNESIUM: Magnesium: 1.8 mg/dL (ref 1.5–2.5)

## 2013-09-10 SURGERY — EGD (ESOPHAGOGASTRODUODENOSCOPY)
Anesthesia: Moderate Sedation

## 2013-09-10 MED ORDER — FAT EMULSION 20 % IV EMUL
240.0000 mL | INTRAVENOUS | Status: AC
  Administered 2013-09-10: 240 mL via INTRAVENOUS
  Filled 2013-09-10: qty 250

## 2013-09-10 MED ORDER — TRACE MINERALS CR-CU-F-FE-I-MN-MO-SE-ZN IV SOLN
INTRAVENOUS | Status: AC
  Administered 2013-09-10: 18:00:00 via INTRAVENOUS
  Filled 2013-09-10: qty 3000

## 2013-09-10 MED ORDER — SODIUM CHLORIDE 0.9 % IV SOLN: INTRAVENOUS | Status: DC

## 2013-09-10 MED ORDER — METOCLOPRAMIDE HCL 5 MG/ML IJ SOLN
5.0000 mg | Freq: Four times a day (QID) | INTRAMUSCULAR | Status: DC
  Administered 2013-09-10 – 2013-09-11 (×3): 5 mg via INTRAVENOUS
  Filled 2013-09-10: qty 2
  Filled 2013-09-10 (×3): qty 1
  Filled 2013-09-10: qty 2
  Filled 2013-09-10: qty 1
  Filled 2013-09-10: qty 2

## 2013-09-10 MED ORDER — PANTOPRAZOLE SODIUM 40 MG IV SOLR: 40.0000 mg | Freq: Two times a day (BID) | INTRAVENOUS | Status: DC

## 2013-09-10 MED ORDER — FENTANYL CITRATE 0.05 MG/ML IJ SOLN
INTRAMUSCULAR | Status: DC | PRN
  Administered 2013-09-10 (×2): 25 ug via INTRAVENOUS

## 2013-09-10 MED ORDER — BUTAMBEN-TETRACAINE-BENZOCAINE 2-2-14 % EX AERO
INHALATION_SPRAY | CUTANEOUS | Status: DC | PRN
  Administered 2013-09-10: 2 via TOPICAL

## 2013-09-10 MED ORDER — PROMETHAZINE HCL 25 MG/ML IJ SOLN
12.5000 mg | Freq: Once | INTRAMUSCULAR | Status: AC
  Administered 2013-09-10: 12.5 mg via INTRAVENOUS
  Filled 2013-09-10: qty 1

## 2013-09-10 MED ORDER — MIDAZOLAM HCL 10 MG/2ML IJ SOLN
INTRAMUSCULAR | Status: DC | PRN
  Administered 2013-09-10 (×3): 2.5 mg via INTRAVENOUS

## 2013-09-10 MED ORDER — PANTOPRAZOLE SODIUM 40 MG IV SOLR
80.0000 mg | Freq: Once | INTRAVENOUS | Status: AC
  Administered 2013-09-10: 80 mg via INTRAVENOUS
  Filled 2013-09-10: qty 80

## 2013-09-10 MED ORDER — FENTANYL CITRATE 0.05 MG/ML IJ SOLN
50.0000 ug | INTRAMUSCULAR | Status: DC | PRN
  Administered 2013-09-10 – 2013-09-13 (×9): 50 ug via INTRAVENOUS
  Filled 2013-09-10 (×9): qty 2

## 2013-09-10 MED ORDER — SODIUM CHLORIDE 0.9 % IV SOLN
8.0000 mg/h | INTRAVENOUS | Status: DC
  Administered 2013-09-10 – 2013-09-13 (×6): 8 mg/h via INTRAVENOUS
  Filled 2013-09-10 (×15): qty 80

## 2013-09-10 MED ORDER — MIDAZOLAM HCL 10 MG/2ML IJ SOLN
INTRAMUSCULAR | Status: AC
  Filled 2013-09-10: qty 6

## 2013-09-10 MED ORDER — DIPHENHYDRAMINE HCL 50 MG/ML IJ SOLN
INTRAMUSCULAR | Status: AC
  Filled 2013-09-10: qty 1

## 2013-09-10 MED ORDER — FENTANYL CITRATE 0.05 MG/ML IJ SOLN
INTRAMUSCULAR | Status: AC
  Filled 2013-09-10: qty 4

## 2013-09-10 MED ORDER — DIPHENHYDRAMINE HCL 50 MG/ML IJ SOLN
INTRAMUSCULAR | Status: DC | PRN
  Administered 2013-09-10 (×2): 25 mg via INTRAVENOUS

## 2013-09-10 NOTE — Progress Notes (Signed)
INITIAL NUTRITION ASSESSMENT  DOCUMENTATION CODES Per approved criteria  -Not Applicable   INTERVENTION: TPN per PharmD  RD to continue to follow nutrition care plan   NUTRITION DIAGNOSIS: Increased nutrient needs related to metastatic rectal cancer as evidenced by estimated needs.   Goal: TPN to meet >/=90% of estimated nutrition needs   Monitor:  TPN tolerance, PO intake, weight trend, labs   Reason for Assessment: Consult for new TPN   58 y.o. male  Admitting Dx: Anemia associated with acute blood loss   ASSESSMENT: Patient is a 58 y.o. male with history of metastatic rectal cancer who is status post cycle 14 of 5-FU with last chemotherapy administered reportedly last week. Patient states that he has been having some bloody emesis and nursing staff reports noting blood in his colostomy bag. Patient 4 nutrition is getting TPN due to surgical bypass and resultant short gut he does however take some oral intake.   -Pt has been on home TPN for 9 months, per pt report  -Pt cycles TPN over 12 hours at home, but unsure of the rate  -Pt undergoing chemotherapy, last treatment was last week  -Pt reports that he has recently gained weight. Suspect some of this may be due to fluid accumulation  -Pt reports that he used to drink coke, sprite, and water PO, but recently has not been thirsty and has not been drinking anything   Nutrition Focused Physical Exam:  Subcutaneous Fat:  Orbital Region: wnl Upper Arm Region: wnl Thoracic and Lumbar Region: wnl   Muscle:  Temple Region: wnl Clavicle Bone Region: mild depletion  Clavicle and Acromion Bone Region: mild depletion  Scapular Bone Region: wnl Dorsal Hand: wnl Patellar Region: wnl Anterior Thigh Region: wnl Posterior Calf Region: wnl  Edema: +1 RLE, +1 LLE   CBG's (mg/dL): 144, 169, 154  Elevated BUN  Potassium, Magnesium, Phosphorus WNL   Pt currently receiving TPN Clinimix E 5/15 @ 80 ml/hr and 20% lipids at 18ml/hr  providing 1843 kcals and 96 grams of protein. Meets 80% of estimated calorie needs and 89% of estimated protein needs.   Height: Ht Readings from Last 1 Encounters:  09/09/13 5\' 9"  (1.753 m)    Weight: Wt Readings from Last 1 Encounters:  09/09/13 157 lb 13.6 oz (71.6 kg)    Ideal Body Weight: 72.7 kg   % Ideal Body Weight: 98%   Wt Readings from Last 10 Encounters:  09/09/13 157 lb 13.6 oz (71.6 kg)  07/05/12 152 lb (68.947 kg)  07/05/12 152 lb (68.947 kg)  06/06/12 156 lb 6.4 oz (70.943 kg)  05/10/12 155 lb 6.4 oz (70.489 kg)  04/26/12 163 lb (73.936 kg)  04/26/12 163 lb (73.936 kg)  04/14/12 163 lb (73.936 kg)  04/03/12 162 lb (73.483 kg)    Usual Body Weight: 150 - 155 lbs  % Usual Body Weight: 103%   BMI:  Body mass index is 23.3 kg/(m^2)., Normal   Estimated Nutritional Needs: Kcal: 2300 - 2500  Protein: 107 - 120 grams  Fluid: >/= 2.3 L/day   Skin: WDL   Diet Order: Clear Liquid  EDUCATION NEEDS: -No education needs identified at this time   Intake/Output Summary (Last 24 hours) at 09/10/13 0950 Last data filed at 09/10/13 0900  Gross per 24 hour  Intake 3064.37 ml  Output   1850 ml  Net 1214.37 ml    Last BM: 5/31   Labs:   Recent Labs Lab 09/09/13 0845 09/10/13 0550  NA 135*  137  K 4.6 4.3  CL 101 100  CO2 24 28  BUN 37* 30*  CREATININE 0.81 0.70  CALCIUM 7.8* 7.7*  MG  --  1.8  PHOS  --  3.3  GLUCOSE 174* 146*    CBG (last 3)   Recent Labs  09/09/13 1749 09/10/13 0022 09/10/13 0630  GLUCAP 144* 169* 154*    Scheduled Meds: . antiseptic oral rinse  15 mL Mouth Rinse q12n4p  . cabergoline  0.25 mg Oral Once per day on Mon Thu  . chlorhexidine  15 mL Mouth Rinse BID  . dexamethasone  4 mg Oral Q breakfast  . fentaNYL  100 mcg Transdermal Q72H  . fentaNYL  100 mcg Transdermal Q72H  . [START ON 09/11/2013] fentaNYL  100 mcg Transdermal Q72H  . fentaNYL  100 mcg Transdermal Once  . fentaNYL  100 mcg Transdermal Once  .  gabapentin  300 mg Oral TID  . HYDROmorphone PCA 0.3 mg/mL   Intravenous 6 times per day  . insulin aspart  0-15 Units Subcutaneous 4 times per day  . octreotide  100 mcg Intravenous 3 times per day  . pantoprazole (PROTONIX) IV  40 mg Intravenous Q12H  . tamsulosin  0.4 mg Oral QHS    Continuous Infusions: . dextrose 5 % and 0.9% NaCl 20 mL/hr at 09/09/13 2000  . Marland KitchenTPN (CLINIMIX-E) Adult 80 mL/hr at 09/10/13 0000   And  . fat emulsion 250 mL (09/09/13 2200)    Past Medical History  Diagnosis Date  . Biliary dyskinesia   . Bowel obstruction   . Ulcerative colitis   . Allergy   . Depression   . Ileostomy in place   . ED (erectile dysfunction)   . Benign positional vertigo   . Prolactinoma   . Hypogonadism male   . History of blood transfusion   . Pituitary tumor 2009  . 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 Colon surgery  2003    Colectomy with ileostomy  . Rectal surgery  2009    Resection of rectal pouch  . Knee arthroscopy    . Cholecystectomy  04/26/2012    Procedure: LAPAROSCOPIC CHOLECYSTECTOMY;  Surgeon: Leighton Ruff, MD;  Location: [facility];  Service: General;  Laterality: N/A;  . Eus N/A 07/05/2012    Procedure: ESOPHAGEAL ENDOSCOPIC ULTRASOUND (EUS) RADIAL;  Surgeon: Arta Silence, MD;  Location: [facility];  Service: Endoscopy;  Laterality: N/A;  celiac block  . Neurolytic celiac plexus N/A 07/05/2012    Procedure: NEUROLYTIC CELIAC PLEXUS;  Surgeon: Arta Silence, MD;  Location: [facility];  Service: Endoscopy;  Laterality: N/A;    Carrolyn Leigh, BS Dietetic Intern Pager: [phone]

## 2013-09-10 NOTE — Progress Notes (Signed)
CARE MANAGEMENT NOTE 09/10/2013  Patient:  Brandon [name],Brandon [name]   Account Number:  [id number]  Date Initiated:  09/10/2013  Documentation initiated by:  DAVIS,RHONDA  Subjective/Objective Assessment:   pt with hypotension and near syncopal episode, found to be anemic and experiencing rectal bleeding.     Action/Plan:   home when stable   Anticipated DC Date:  09/13/2013   Anticipated DC Plan:  HOME/SELF CARE  In-house referral  NA      DC Planning Services  NA      PAC Choice  NA   Choice offered to / List presented to:  NA   DME arranged  NA      DME agency  NA     [place] arranged  NA      [place] agency  NA   Status of service:  In process, will continue to follow Medicare Important Message given?  NA - LOS <3 / Initial given by admissions (If response is "NO", the following Medicare IM given date fields will be blank) Date Medicare IM given:   Date Additional Medicare IM given:    Discharge Disposition:    Per UR Regulation:  Reviewed for med. necessity/level of care/duration of stay  If discussed at [place] of Stay Meetings, dates discussed:    Comments:  [id number]/Rhonda Eldridge Dace, [place], [place] [phone] Chart Reviewed for discharge and hospital needs. Discharge needs at time of review: None present will follow for needs. Review of patient progress due on [id number].

## 2013-09-10 NOTE — Op Note (Signed)
[facility] [place], [address]   ENDOSCOPY PROCEDURE REPORT  PATIENT: Brandon, [name]  MR#: [id number] BIRTHDATE: Apr 28, 1955 , 57  yrs. old GENDER: Male ENDOSCOPIST:Eleno Weimar Amedeo Plenty, MD REFERRED BY: PROCEDURE DATE:  09/10/2013 PROCEDURE: ASA CLASS: INDICATIONS:   GI bleeding/hematemesis MEDICATION:    50 mcg fentanyl, 50 mg Benadryl, 6 mg Versed. TOPICAL ANESTHETIC:   Cetacaine spray  DESCRIPTION OF PROCEDURE:   esophagus: Normal except for some refluxed coffee ground material Stomach: Large body of old coffee ground old clots and possibly food obscuring a large portion of the dependent portion especially proximally. Antrum and pylorus well seen and there was a 6 mm cratered prepyloric ulcer with no stigmata of hemorrhage. The pylorus was nondeformed. Duodenum: Proximal bulb was normal. At the junction of the bulb and second portion there appeared to be a very large duodenal ulcer that was hard to see the entire rim off, part of which was submerged in what appeared to be fairly well adherent clot although I could not see the distal margin of the due to clot or the ulcer. There did not appear to be any fresh active bleeding. There did not appear to be any focal area that would be amenable to endoscopic therapy. This ulcer was estimated to be about 2 cm in diameter. The scope was then withdrawn and the patient left in his ICU bed in stable condition. He tolerated the procedure well and there were no immediate complications.     COMPLICATIONS: None  ENDOSCOPIC IMPRESSION:giant duodenal ulcer and moderate sized prepyloric ulcer, some stigmata of hemorrhage associated with a larger ulcer but no clear visible vessel that was exposed or actively bleeding to pursue endoscopic therapy to  RECOMMENDATIONS:high-dose proton pump inhibitor followed by infusion. Transfuse as necessary. Will give IV Reglan to clear blood and hopefully avoid further  vomiting. I am doubtful that if this rebleeds at endoscopic therapy is likely to be successful and if medical therapy does not suffice, would consider arteriography or surgery which he would be a difficult candidate for given all his other abdominal surgery.    _______________________________ Lorrin MaisTeena Irani, MD 09/10/2013 3:38 PM

## 2013-09-10 NOTE — Progress Notes (Signed)
[facility] PROGRESS NOTE  Brandon [name] UJW:[id number] DOB: [birth year] DOA: 09/09/2013 PCP: [name], MD  Assessment/Plan: Active Problems:   Anemia associated with acute blood loss - In context of patient with history of metastatic rectal cancer with diagnosis or duodenal ulcer 07/03/12 (Dr. Paulita Fujita) and with NSAIDs on his home medication list. - I have consulted GI for further evaluation. Pt is NPO - Continue to monitor cbc's    Rectal cancer/Metastatic carcinoma - Once discharged from hospital patient can follow up with oncologist at [facility] for further evaluation - Pt currently on TPN  Code Status: DNR Family Communication: Discussed with patient and father at bedside. Disposition Plan: Pending further work up and recommendations from GI standpoint.   Consultants:  GI: Dr. Amedeo Plenty  Procedures:  none  Antibiotics:  None  HPI/Subjective: Pt states that pain is starting to be well controlled. After I left the room nursing paged me several minutes later to indicate that patient had bout of emesis.  Objective: Filed Vitals:   09/10/13 1100  BP: 138/96  Pulse: 129  Temp:   Resp: 22    Intake/Output Summary (Last 24 hours) at 09/10/13 1157 Last data filed at 09/10/13 1100  Gross per 24 hour  Intake 3284.37 ml  Output   2600 ml  Net 684.37 ml   Filed Weights   09/09/13 1151  Weight: 71.6 kg (157 lb 13.6 oz)    Exam:   General:  Pt in NAD, alert and awake  Cardiovascular: RRR, no MRG  Respiratory: CTA BL, no wheezes  Abdomen: soft, ND  Musculoskeletal: no cyanosis or clubbing.   Data Reviewed: Basic Metabolic Panel:  Recent Labs Lab 09/09/13 0845 09/10/13 0550  NA 135* 137  K 4.6 4.3  CL 101 100  CO2 24 28  GLUCOSE 174* 146*  BUN 37* 30*  CREATININE 0.81 0.70  CALCIUM 7.8* 7.7*  MG  --  1.8  PHOS  --  3.3   Liver Function Tests:  Recent Labs Lab 09/09/13 0845 09/10/13 0550  AST 53* 15  ALT 88* 60*  ALKPHOS 135*  113  BILITOT 0.5 0.3  PROT 5.0* 5.1*  ALBUMIN 2.2* 2.2*   No results found for this basename: LIPASE, AMYLASE,  in the last 168 hours No results found for this basename: AMMONIA,  in the last 168 hours CBC:  Recent Labs Lab 09/09/13 0845 09/09/13 2130 09/10/13 0550 09/10/13 1130  WBC 34.7* 15.6* 14.0* 12.2*  NEUTROABS 29.8*  --  10.9*  --   HGB 7.3* 10.0* 9.2* 9.3*  HCT 22.2* 29.7* 28.2* 27.5*  MCV 101.8* 92.8 93.4 92.3  PLT 325 232 232 219   Cardiac Enzymes: No results found for this basename: CKTOTAL, CKMB, CKMBINDEX, TROPONINI,  in the last 168 hours BNP (last 3 results) No results found for this basename: PROBNP,  in the last 8760 hours CBG:  Recent Labs Lab 09/09/13 1749 09/10/13 0022 09/10/13 0630  GLUCAP 144* 169* 154*    Recent Results (from the past 240 hour(s))  CULTURE, BLOOD (ROUTINE X 2)     Status: None   Collection Time    09/09/13  8:45 AM      Result Value Ref Range Status   Specimen Description BLOOD PICC LINE   Final   Special Requests BOTTLES DRAWN AEROBIC AND ANAEROBIC 5CC [place]   Final   Culture  Setup Time     Final   Value: 09/09/2013 14:17     Performed at [facility]  Culture     Final   Value:        BLOOD CULTURE RECEIVED NO GROWTH TO DATE CULTURE WILL BE HELD FOR 5 DAYS BEFORE ISSUING A FINAL NEGATIVE REPORT     Performed at [facility]   Report Status PENDING   Incomplete  CULTURE, BLOOD (ROUTINE X 2)     Status: None   Collection Time    09/09/13  9:23 AM      Result Value Ref Range Status   Specimen Description BLOOD RIGHT HAND   Final   Special Requests BOTTLES DRAWN AEROBIC AND ANAEROBIC 5CC EACH   Final   Culture  Setup Time     Final   Value: 09/09/2013 14:16     Performed at [facility]   Culture     Final   Value:        BLOOD CULTURE RECEIVED NO GROWTH TO DATE CULTURE WILL BE HELD FOR 5 DAYS BEFORE ISSUING A FINAL NEGATIVE REPORT     Performed at [facility]   Report Status  PENDING   Incomplete  MRSA PCR SCREENING     Status: None   Collection Time    09/09/13 11:58 AM      Result Value Ref Range Status   MRSA by PCR NEGATIVE  NEGATIVE Final   Comment:            The GeneXpert MRSA Assay (FDA     approved for NASAL specimens     only), is one component of a     comprehensive MRSA colonization     surveillance program. It is not     intended to diagnose MRSA     infection nor to guide or     monitor treatment for     MRSA infections.     Studies: Dg Chest Port 1 View  09/09/2013   CLINICAL DATA:  Weakness and nausea  EXAM: PORTABLE CHEST - 1 VIEW  COMPARISON:  Length 10/21/2007  FINDINGS: Dual lumen catheter is now seen on the right with the tip at the cavoatrial junction. The cardiac shadow is within normal limits. The lungs are clear. No bony abnormality is noted.  IMPRESSION: No active disease.   Electronically Signed   By: Inez Catalina M.D.   On: 09/09/2013 09:04    Scheduled Meds: . antiseptic oral rinse  15 mL Mouth Rinse q12n4p  . cabergoline  0.25 mg Oral Once per day on Mon Thu  . chlorhexidine  15 mL Mouth Rinse BID  . dexamethasone  4 mg Oral Q breakfast  . fentaNYL  100 mcg Transdermal Q72H  . fentaNYL  100 mcg Transdermal Q72H  . [START ON 09/11/2013] fentaNYL  100 mcg Transdermal Q72H  . fentaNYL  100 mcg Transdermal Once  . fentaNYL  100 mcg Transdermal Once  . gabapentin  300 mg Oral TID  . HYDROmorphone PCA 0.3 mg/mL   Intravenous 6 times per day  . insulin aspart  0-15 Units Subcutaneous 4 times per day  . octreotide  100 mcg Intravenous 3 times per day  . pantoprazole (PROTONIX) IV  40 mg Intravenous Q12H  . tamsulosin  0.4 mg Oral QHS   Continuous Infusions: . dextrose 5 % and 0.9% NaCl 20 mL/hr at 09/09/13 2000  . Marland KitchenTPN (CLINIMIX-E) Adult     And  . fat emulsion    . Marland KitchenTPN (CLINIMIX-E) Adult 80 mL/hr at 09/10/13 0000   And  . fat emulsion  250 mL (09/09/13 2200)     Time spent: > 35 minutes    [facility] Pager [phone] If 7PM-7AM, please contact night-coverage at [email or web address], password [email or web address] 09/10/2013, 11:57 AM  LOS: 1 day

## 2013-09-10 NOTE — Progress Notes (Signed)
[facility]  Patient Status: Active (receiving services up to time of hospitalization)  [facility] is providing the following services: RN, TPN, Dilaudid PCA pump, Chemotherapy   If patient discharges after hours, please call [phone].   Kristen Hayworth 09/10/2013, 2:08 PM

## 2013-09-10 NOTE — Consult Note (Addendum)
[facility] Consult Note  Referring Provider: No ref. provider found Primary Care Physician:  [name], MD Primary Gastroenterologist:  Dr.  Laurel Dimmer Complaint: Vomiting blood and blood through ostomy HPI: Brandon [name] is an 58 y.o. Caucasian male  with metastatic colon cancer initially operated on 6 years ago with a second surgical procedure with significant bypass of small intestine for metastatic disease in January of this year leaving him dependent on TPN. He presented with a near syncopal episode and anemia. He reportedly has a history of duodenal ulcer by endoscopy/EUS 2 years ago. He does use nonsteroidal anti-inflammatory drugs frequently. His hemoglobin was 9.3 after transfusion of 2 units of packed red blood cells presenting  with hemoglobin of 10. He vomited what he estimates to be 750 cc of coffee-ground material today.  Past Medical History  Diagnosis Date  . Biliary dyskinesia   . Bowel obstruction   . Ulcerative colitis   . Allergy   . Depression   . Ileostomy in place   . ED (erectile dysfunction)   . Benign positional vertigo   . Prolactinoma   . Hypogonadism male   . History of blood transfusion   . Pituitary tumor 2009  . 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 Colon surgery  2003    Colectomy with ileostomy  . Rectal surgery  2009    Resection of rectal pouch  . Knee arthroscopy    . Cholecystectomy  04/26/2012    Procedure: LAPAROSCOPIC CHOLECYSTECTOMY;  Surgeon: Leighton Ruff, MD;  Location: [facility];  Service: General;  Laterality: N/A;  . Eus N/A 07/05/2012    Procedure: ESOPHAGEAL ENDOSCOPIC ULTRASOUND (EUS) RADIAL;  Surgeon: Arta Silence, MD;  Location: [facility];  Service: Endoscopy;  Laterality: N/A;  celiac block  . Neurolytic celiac plexus N/A 07/05/2012    Procedure: NEUROLYTIC CELIAC PLEXUS;  Surgeon: Arta Silence, MD;  Location: [facility];  Service: Endoscopy;   Laterality: N/A;    Medications Prior to Admission  Medication Sig Dispense Refill  . acetaminophen (TYLENOL) 500 MG tablet Take 500 mg by mouth every 4 (four) hours as needed for pain.       . cabergoline (DOSTINEX) 0.5 MG tablet Take 0.25 mg by mouth 2 (two) times a week. Mondays and Thursdays.      . clonazePAM (KLONOPIN) 0.5 MG tablet Take 0.5 mg by mouth 3 (three) times daily as needed for anxiety.       . cyclobenzaprine (FLEXERIL) 10 MG tablet Take 10 mg by mouth 2 (two) times daily as needed for muscle spasms.      Marland Kitchen dexamethasone (DECADRON) 4 MG tablet Take 4 mg by mouth daily with breakfast.      . diazepam (VALIUM) 2 MG tablet Take 2 mg by mouth every 6 (six) hours as needed for anxiety.      . fentaNYL (DURAGESIC - DOSED MCG/HR) 100 MCG/HR Place 300 mcg onto the skin every 3 (three) days. Patient has three 176mcg patches on at all times but staggers their application so he changes one patch each day.      . gabapentin (NEURONTIN) 300 MG capsule Take 300 mg by mouth 3 (three) times daily.      Marland Kitchen HYDROmorphone HCl (DILAUDID IJ) Inject as directed. Ambulatory pump/PCA: No basal rate. $RemoveBef'1mg'TbwPLLXQXR$  PCA dose q38min prn with lock out of $Remo'4mg'EPcPE$ /hr.      Marland Kitchen ibuprofen (ADVIL,MOTRIN) 200 MG tablet Take 400  mg by mouth every 4 (four) hours as needed for pain.       Marland Kitchen ondansetron (ZOFRAN-ODT) 8 MG disintegrating tablet Take 8 mg by mouth every 8 (eight) hours as needed for nausea or vomiting.      Marland Kitchen PRESCRIPTION MEDICATION Per [facility]: 5-FU $RemoveB'4130mg'vnpstIHz$ /265ml(conc = $RemoveBefor'18mg'rdCSBqTZNDPF$ /ml) infused at 50ml/hr over 46hrs, repeated q14d. Last infusion started 08/23/13. Next infusion scheduled to start 09/10/13.      Marland Kitchen PRESCRIPTION MEDICATION       . prochlorperazine (COMPAZINE) 10 MG tablet Take 10 mg by mouth every 8 (eight) hours as needed for nausea or vomiting.      . promethazine (PHENERGAN) 25 MG tablet Take 25 mg by mouth every 6 (six) hours as needed for nausea.      . tamsulosin (FLOMAX) 0.4 MG CAPS capsule Take 0.4 mg by mouth at  bedtime.        Allergies:  Allergies  Allergen Reactions  . Codeine Nausea And Vomiting  . Effexor [Venlafaxine Hcl] Other (See Comments)    Flat Feeling  . Imuran [Azathioprine] Nausea And Vomiting    Family History  Problem Relation Age of Onset  . Cancer Mother     ovarian    Social History:  reports that he has never smoked. He has never used smokeless tobacco. He reports that he does not drink alcohol or use illicit drugs.  Review of Systems: negative except see history and physical   Blood pressure 130/80, pulse 112, temperature 98.4 F (36.9 C), temperature source Axillary, resp. rate 9, height $RemoveB'5\' 9"'cJtMaiEM$  (1.753 m), weight 71.6 kg (157 lb 13.6 oz), SpO2 97.00%. Head: Normocephalic, without obvious abnormality, atraumatic Neck: no adenopathy, no carotid bruit, no JVD, supple, symmetrical, trachea midline and thyroid not enlarged, symmetric, no tenderness/mass/nodules Resp: clear to auscultation bilaterally Cardio: regular rate and rhythm, S1, S2 normal, no murmur, click, rub or gallop GI: Abdomen slightly distended. 2 Ostomy sites are visible with maroon stool and one of them. Extremities: extremities normal, atraumatic, no cyanosis or edema  Results for orders placed during the hospital encounter of 09/09/13 (from the past 48 hour(s))  CULTURE, BLOOD (ROUTINE X 2)     Status: None   Collection Time    09/09/13  8:45 AM      Result Value Ref Range   Specimen Description BLOOD PICC LINE     Special Requests BOTTLES DRAWN AEROBIC AND ANAEROBIC 5CC EACHA     Culture  Setup Time       Value: 09/09/2013 14:17     Performed at [facility]   Culture       Value:        BLOOD CULTURE RECEIVED NO GROWTH TO DATE CULTURE WILL BE HELD FOR 5 DAYS BEFORE ISSUING A FINAL NEGATIVE REPORT     Performed at [facility]   Report Status PENDING    CBC WITH DIFFERENTIAL     Status: Abnormal   Collection Time    09/09/13  8:45 AM      Result Value Ref Range   WBC 34.7  (*) 4.0 - 10.5 K/uL   RBC 2.18 (*) 4.22 - 5.81 MIL/uL   Hemoglobin 7.3 (*) 13.0 - 17.0 g/dL   HCT 22.2 (*) 39.0 - 52.0 %   MCV 101.8 (*) 78.0 - 100.0 fL   MCH 33.5  26.0 - 34.0 pg   MCHC 32.9  30.0 - 36.0 g/dL   RDW 15.0  11.5 - 15.5 %  Platelets 325  150 - 400 K/uL   Neutrophils Relative % 86 (*) 43 - 77 %   Lymphocytes Relative 6 (*) 12 - 46 %   Monocytes Relative 8  3 - 12 %   Eosinophils Relative 0  0 - 5 %   Basophils Relative 0  0 - 1 %   Neutro Abs 29.8 (*) 1.7 - 7.7 K/uL   Lymphs Abs 2.1  0.7 - 4.0 K/uL   Monocytes Absolute 2.8 (*) 0.1 - 1.0 K/uL   Eosinophils Absolute 0.0  0.0 - 0.7 K/uL   Basophils Absolute 0.0  0.0 - 0.1 K/uL   WBC Morphology MILD LEFT SHIFT (1-5% METAS, OCC MYELO, OCC BANDS)     Comment: TOXIC GRANULATION   Smear Review PLATELET CLUMPS NOTED ON SMEAR    COMPREHENSIVE METABOLIC PANEL     Status: Abnormal   Collection Time    09/09/13  8:45 AM      Result Value Ref Range   Sodium 135 (*) 137 - 147 mEq/L   Potassium 4.6  3.7 - 5.3 mEq/L   Chloride 101  96 - 112 mEq/L   CO2 24  19 - 32 mEq/L   Glucose, Bld 174 (*) 70 - 99 mg/dL   BUN 37 (*) 6 - 23 mg/dL   Creatinine, Ser 0.81  0.50 - 1.35 mg/dL   Calcium 7.8 (*) 8.4 - 10.5 mg/dL   Total Protein 5.0 (*) 6.0 - 8.3 g/dL   Albumin 2.2 (*) 3.5 - 5.2 g/dL   AST 53 (*) 0 - 37 U/L   ALT 88 (*) 0 - 53 U/L   Alkaline Phosphatase 135 (*) 39 - 117 U/L   Total Bilirubin 0.5  0.3 - 1.2 mg/dL   GFR calc non Af Amer >90  >90 mL/min   GFR calc Af Amer >90  >90 mL/min   Comment: (NOTE)     The eGFR has been calculated using the CKD EPI equation.     This calculation has not been validated in all clinical situations.     eGFR's persistently <90 mL/min signify possible Chronic Kidney     Disease.  PROCALCITONIN     Status: None   Collection Time    09/09/13  8:45 AM      Result Value Ref Range   Procalcitonin 0.20     Comment:            Interpretation:     PCT (Procalcitonin) <= 0.5 ng/mL:     Systemic  infection (sepsis) is not likely.     Local bacterial infection is possible.     (NOTE)             ICU PCT Algorithm               Non ICU PCT Algorithm        ----------------------------     ------------------------------             PCT < 0.25 ng/mL                 PCT < 0.1 ng/mL         Stopping of antibiotics            Stopping of antibiotics           strongly encouraged.               strongly encouraged.        ----------------------------     ------------------------------  PCT level decrease by               PCT < 0.25 ng/mL           >= 80% from peak PCT           OR PCT 0.25 - 0.5 ng/mL          Stopping of antibiotics                                                 encouraged.         Stopping of antibiotics               encouraged.        ----------------------------     ------------------------------           PCT level decrease by              PCT >= 0.25 ng/mL           < 80% from peak PCT            AND PCT >= 0.5 ng/mL            Continuing antibiotics                                                  encouraged.           Continuing antibiotics                encouraged.        ----------------------------     ------------------------------         PCT level increase compared          PCT > 0.5 ng/mL             with peak PCT AND              PCT >= 0.5 ng/mL             Escalation of antibiotics                                              strongly encouraged.          Escalation of antibiotics            strongly encouraged.  PROTIME-INR     Status: None   Collection Time    09/09/13  8:45 AM      Result Value Ref Range   Prothrombin Time 12.9  11.6 - 15.2 seconds   INR 0.99  0.00 - 1.49  I-STAT CG4 LACTIC ACID, ED     Status: None   Collection Time    09/09/13  8:58 AM      Result Value Ref Range   Lactic Acid, Venous 1.56  0.5 - 2.2 mmol/L  CULTURE, BLOOD (ROUTINE X 2)     Status: None   Collection Time    09/09/13  9:23 AM      Result  Value Ref Range   Specimen Description BLOOD RIGHT HAND     Special Requests BOTTLES  DRAWN AEROBIC AND ANAEROBIC 5CC EACH     Culture  Setup Time       Value: 09/09/2013 14:16     Performed at [facility]   Culture       Value:        BLOOD CULTURE RECEIVED NO GROWTH TO DATE CULTURE WILL BE HELD FOR 5 DAYS BEFORE ISSUING A FINAL NEGATIVE REPORT     Performed at [facility]   Report Status PENDING    PREPARE RBC (CROSSMATCH)     Status: None   Collection Time    09/09/13  9:28 AM      Result Value Ref Range   Order Confirmation ORDER PROCESSED BY BLOOD BANK    TYPE AND SCREEN     Status: None   Collection Time    09/09/13 10:42 AM      Result Value Ref Range   ABO/RH(D) O POS     Antibody Screen NEG     Sample Expiration 09/12/2013     Unit Number [id number]     Blood Component Type RED CELLS,LR     Unit division 00     Status of Unit ISSUED,FINAL     Transfusion Status OK TO TRANSFUSE     Crossmatch Result Compatible     Unit Number [id number]     Blood Component Type RED CELLS,LR     Unit division 00     Status of Unit ISSUED,FINAL     Transfusion Status OK TO TRANSFUSE     Crossmatch Result Compatible    ABO/RH     Status: None   Collection Time    09/09/13 10:42 AM      Result Value Ref Range   ABO/RH(D) O POS    MRSA PCR SCREENING     Status: None   Collection Time    09/09/13 11:58 AM      Result Value Ref Range   MRSA by PCR NEGATIVE  NEGATIVE   Comment:            The GeneXpert MRSA Assay (FDA     approved for NASAL specimens     only), is one component of a     comprehensive MRSA colonization     surveillance program. It is not     intended to diagnose MRSA     infection nor to guide or     monitor treatment for     MRSA infections.  GLUCOSE, CAPILLARY     Status: Abnormal   Collection Time    09/09/13  5:49 PM      Result Value Ref Range   Glucose-Capillary 144 (*) 70 - 99 mg/dL  CBC     Status: Abnormal   Collection Time     09/09/13  9:30 PM      Result Value Ref Range   WBC 15.6 (*) 4.0 - 10.5 K/uL   RBC 3.20 (*) 4.22 - 5.81 MIL/uL   Hemoglobin 10.0 (*) 13.0 - 17.0 g/dL   Comment: DELTA CHECK NOTED     POST TRANSFUSION SPECIMEN   HCT 29.7 (*) 39.0 - 52.0 %   MCV 92.8  78.0 - 100.0 fL   Comment: DELTA CHECK NOTED     POST TRANSFUSION SPECIMEN   MCH 31.3  26.0 - 34.0 pg   MCHC 33.7  30.0 - 36.0 g/dL   RDW 17.7 (*) 11.5 - 15.5 %   Platelets 232  150 - 400 K/uL  Comment: DELTA CHECK NOTED     REPEATED TO VERIFY     SPECIMEN CHECKED FOR CLOTS  GLUCOSE, CAPILLARY     Status: Abnormal   Collection Time    09/10/13 12:22 AM      Result Value Ref Range   Glucose-Capillary 169 (*) 70 - 99 mg/dL  URINALYSIS, ROUTINE W REFLEX MICROSCOPIC     Status: None   Collection Time    09/10/13  3:20 AM      Result Value Ref Range   Color, Urine YELLOW  YELLOW   APPearance CLEAR  CLEAR   Specific Gravity, Urine 1.024  1.005 - 1.030   pH 6.0  5.0 - 8.0   Glucose, UA NEGATIVE  NEGATIVE mg/dL   Hgb urine dipstick NEGATIVE  NEGATIVE   Bilirubin Urine NEGATIVE  NEGATIVE   Ketones, ur NEGATIVE  NEGATIVE mg/dL   Protein, ur NEGATIVE  NEGATIVE mg/dL   Urobilinogen, UA 0.2  0.0 - 1.0 mg/dL   Nitrite NEGATIVE  NEGATIVE   Leukocytes, UA NEGATIVE  NEGATIVE   Comment: MICROSCOPIC NOT DONE ON URINES WITH NEGATIVE PROTEIN, BLOOD, LEUKOCYTES, NITRITE, OR GLUCOSE <1000 mg/dL.  CBC     Status: Abnormal   Collection Time    09/10/13  5:50 AM      Result Value Ref Range   WBC 14.0 (*) 4.0 - 10.5 K/uL   RBC 3.02 (*) 4.22 - 5.81 MIL/uL   Hemoglobin 9.2 (*) 13.0 - 17.0 g/dL   HCT 28.2 (*) 39.0 - 52.0 %   MCV 93.4  78.0 - 100.0 fL   MCH 30.5  26.0 - 34.0 pg   MCHC 32.6  30.0 - 36.0 g/dL   RDW 18.2 (*) 11.5 - 15.5 %   Platelets 232  150 - 400 K/uL  COMPREHENSIVE METABOLIC PANEL     Status: Abnormal   Collection Time    09/10/13  5:50 AM      Result Value Ref Range   Sodium 137  137 - 147 mEq/L   Potassium 4.3  3.7 - 5.3  mEq/L   Chloride 100  96 - 112 mEq/L   CO2 28  19 - 32 mEq/L   Glucose, Bld 146 (*) 70 - 99 mg/dL   BUN 30 (*) 6 - 23 mg/dL   Creatinine, Ser 0.70  0.50 - 1.35 mg/dL   Calcium 7.7 (*) 8.4 - 10.5 mg/dL   Total Protein 5.1 (*) 6.0 - 8.3 g/dL   Albumin 2.2 (*) 3.5 - 5.2 g/dL   AST 15  0 - 37 U/L   ALT 60 (*) 0 - 53 U/L   Alkaline Phosphatase 113  39 - 117 U/L   Total Bilirubin 0.3  0.3 - 1.2 mg/dL   GFR calc non Af Amer >90  >90 mL/min   GFR calc Af Amer >90  >90 mL/min   Comment: (NOTE)     The eGFR has been calculated using the CKD EPI equation.     This calculation has not been validated in all clinical situations.     eGFR's persistently <90 mL/min signify possible Chronic Kidney     Disease.  MAGNESIUM     Status: None   Collection Time    09/10/13  5:50 AM      Result Value Ref Range   Magnesium 1.8  1.5 - 2.5 mg/dL  PHOSPHORUS     Status: None   Collection Time    09/10/13  5:50 AM  Result Value Ref Range   Phosphorus 3.3  2.3 - 4.6 mg/dL  TRIGLYCERIDES     Status: Abnormal   Collection Time    09/10/13  5:50 AM      Result Value Ref Range   Triglycerides 163 (*) <150 mg/dL   Comment: Performed at [facility]  DIFFERENTIAL     Status: Abnormal   Collection Time    09/10/13  5:50 AM      Result Value Ref Range   Neutrophils Relative % 78 (*) 43 - 77 %   Neutro Abs 10.9 (*) 1.7 - 7.7 K/uL   Lymphocytes Relative 12  12 - 46 %   Lymphs Abs 1.7  0.7 - 4.0 K/uL   Monocytes Relative 9  3 - 12 %   Monocytes Absolute 1.3 (*) 0.1 - 1.0 K/uL   Eosinophils Relative 1  0 - 5 %   Eosinophils Absolute 0.1  0.0 - 0.7 K/uL   Basophils Relative 0  0 - 1 %   Basophils Absolute 0.0  0.0 - 0.1 K/uL  GLUCOSE, CAPILLARY     Status: Abnormal   Collection Time    09/10/13  6:30 AM      Result Value Ref Range   Glucose-Capillary 154 (*) 70 - 99 mg/dL  CBC     Status: Abnormal   Collection Time    09/10/13 11:30 AM      Result Value Ref Range   WBC 12.2 (*) 4.0 - 10.5  K/uL   RBC 2.98 (*) 4.22 - 5.81 MIL/uL   Hemoglobin 9.3 (*) 13.0 - 17.0 g/dL   HCT 27.5 (*) 39.0 - 52.0 %   MCV 92.3  78.0 - 100.0 fL   MCH 31.2  26.0 - 34.0 pg   MCHC 33.8  30.0 - 36.0 g/dL   RDW 17.9 (*) 11.5 - 15.5 %   Platelets 219  150 - 400 K/uL  GLUCOSE, CAPILLARY     Status: Abnormal   Collection Time    09/10/13 12:02 PM      Result Value Ref Range   Glucose-Capillary 182 (*) 70 - 99 mg/dL   Dg Chest Port 1 View  09/09/2013   CLINICAL DATA:  Weakness and nausea  EXAM: PORTABLE CHEST - 1 VIEW  COMPARISON:  Length 10/21/2007  FINDINGS: Dual lumen catheter is now seen on the right with the tip at the cavoatrial junction. The cardiac shadow is within normal limits. The lungs are clear. No bony abnormality is noted.  IMPRESSION: No active disease.   Electronically Signed   By: Inez Catalina M.D.   On: 09/09/2013 09:04    Assessment: 1. GI bleeding with upper tract source suggested 2. Possible intestinal obstruction related to metastatic cancer causing persistent vomiting 3. 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 Heavy use of NSAIDs  Plan:  IV proton pump inhibitor. Transfuse as necessary. We'll proceed with endoscopy later today. Missy Sabins 09/10/2013, 12:50 PM

## 2013-09-10 NOTE — Progress Notes (Addendum)
PARENTERAL NUTRITION CONSULT NOTE - Follow Up  Pharmacy Consult for TPN Indication: Short bowel d/t rectal cancer  Allergies  Allergen Reactions  . Codeine Nausea And Vomiting  . Effexor [Venlafaxine Hcl] Other (See Comments)    Flat Feeling  . Imuran [Azathioprine] Nausea And Vomiting    Patient Measurements: Height: 5\' 9"  (175.3 cm) Weight: 157 lb 13.6 oz (71.6 kg) IBW/kg (Calculated) : 70.7  Vital Signs: Temp: 98.2 F (36.8 C) (06/01 0400) Temp src: Oral (06/01 0400) BP: 137/75 mmHg (06/01 0700) Pulse Rate: 103 (06/01 0700) Intake/Output from previous day: 05/31 0701 - 06/01 0700 In: 2781.1 [P.O.:120; I.V.:908.2; Blood:690; IV Piggyback:100; TPN:962.8] Out: 1600 [Urine:850; Stool:750] Intake/Output from this shift:    Labs:  Recent Labs  09/09/13 0845 09/09/13 2130 09/10/13 0550  WBC 34.7* 15.6* 14.0*  HGB 7.3* 10.0* 9.2*  HCT 22.2* 29.7* 28.2*  PLT 325 232 232  INR 0.99  --   --      Recent Labs  09/09/13 0845 09/10/13 0550  NA 135* 137  K 4.6 4.3  CL 101 100  CO2 24 28  GLUCOSE 174* 146*  BUN 37* 30*  CREATININE 0.81 0.70  CALCIUM 7.8* 7.7*  MG  --  1.8  PHOS  --  3.3  PROT 5.0* 5.1*  ALBUMIN 2.2* 2.2*  AST 53* 15  ALT 88* 60*  ALKPHOS 135* 113  BILITOT 0.5 0.3   Estimated Creatinine Clearance: 101.9 ml/min (by C-G formula based on Cr of 0.7).    Recent Labs  09/09/13 1749 09/10/13 0022 09/10/13 0630  GLUCAP 144* 169* 154*    Insulin Requirements in the past 24 hours:  8 units SSI  Current Nutrition:  CLD Clinimix E 5/15 at a goal rate of 4ml/hr + 20% fat emulsion at 98ml/hr continuous infusions  IVF: D5-NS at 20 ml/hr  Assessment: [name] w/ metastatic rectal cancer on TPN at home, admitted w/ syncope and vomiting blood. Also on an ambulatory Dilaudid PCA and getting IV chemotherapy at home via [facility]. Managed by MDs at [facility] tx [facility] when stabilized. Recently was considering hospice. Pharmacy is asked to dose  TPN while inpatient.    Glucose - CBGs slightly above goal (< 150 mg/dL).  SSI moderate scale started.  Will consider adjusting to resistant scale tomorrow if CBGs remain above goal.  Electrolytes - K/Mag/Phos WNL.  Corr Ca 9.14.  Other lytes ok.  Renal - BUN slightly elevated, SCr WNL  LFTs - ALT elevated but improved from yesterday  TGs - in process  Prealbumin - in process  Nutritional Goals:   RD's Estimated Nutritional Needs (6/1): Kcal: 2300-2500, Protein: 107-120 grams, Fluid: >/=2.3 L/day   TPN PTA: Cyclic infusion (4X-3K), 2574ml delivered. Bag includes lipids one day a week. Daily avg: 1824Kcal and 96g protein. Standard MVI/TE. No insulin. Octreotide 329mcg/bag.  RD's recommendations this admission are higher than what patient is receiving in home TNA.  Will adjust our TNA to meet RD's goals.  Clinimix E 5/20 at a goal rate of 26ml/hr + 20% fat emulsion at 36ml/hr to provide: 108g/day protein, 2380Kcal/day.  Providing over continuous infusion throughout day for now (as opposed to cycling over 12 hours) due to acute illness and increased nutrition goals.  Will provide octreotide 100 mcg IV TID as a separate order to replace octreotide administered through home TNA.  MD plans to transfer patient to [facility] following blood transfusions.  Per RN, patient continues to have blood in colostomy bag today  so do not anticipate transfer today.  TPN Access: PICC TPN day#: 2 (inpatient)  Plan: At 1800:  Change to Clinimix E 5/20 at 90 ml/hr.    20% fat emulsion at 56ml/hr.  TNA to contain standard multivitamins and trace elements.  IVF reduced to [facility], 51ml/hr, to account for volume provided by TNA.  Continue moderate SSI q6h.   TNA lab panels on Mondays & Thursdays.  BMET, Mag, Phos in AM.  F/u daily.  Hershal Coria, PharmD, BCPS Pager: [phone] 09/10/2013 8:04 AM

## 2013-09-10 NOTE — Progress Notes (Signed)
I have reviewed and agree with nutrition assessment completed by Rebekah Matznick, DI  Terika Pillard F Kaspian Muccio MS RD LDN Clinical Dietitian Pager:[phone]  

## 2013-09-10 NOTE — Progress Notes (Signed)
Addendum: EGD revealed one small pyloric channel and one very large distal duodenal ulcer greater 1-1/2 cm with its distal margin obscured by what appeared to be an adherent blood clot that would not wash off. I did not see any clear visible vessel outside of the clotted area. There is a fair amount of coffee grounds and possibly old food in the stomach. I cannot rule out other bleeding lesions in the distal duodenum or dependent portions of the fundus but suspect that the majority of the bleeding was from the large duodenal ulcer which hopefully has an adherent clot which was tamponading bleeding although that is not certain. Will escalate Protonix 80 mg followed by an 8 mg per hour drip. This lesion has high rebleeding potential and due to its size and location this may not be amenable to endoscopic therapy should bleeding continue or recur

## 2013-09-11 ENCOUNTER — Encounter (HOSPITAL_COMMUNITY): Payer: Self-pay | Admitting: Gastroenterology

## 2013-09-11 LAB — BASIC METABOLIC PANEL
BUN: 24 mg/dL — AB (ref 6–23)
CALCIUM: 7.7 mg/dL — AB (ref 8.4–10.5)
CO2: 27 meq/L (ref 19–32)
Chloride: 98 mEq/L (ref 96–112)
Creatinine, Ser: 0.75 mg/dL (ref 0.50–1.35)
GFR calc Af Amer: >90 mL/min (ref >90)
GFR calc non Af Amer: >90 mL/min (ref >90)
GLUCOSE: 176 mg/dL — AB (ref 70–99)
Potassium: 4.1 mEq/L (ref 3.7–5.3)
SODIUM: 134 meq/L — AB (ref 137–147)

## 2013-09-11 LAB — GLUCOSE, CAPILLARY
GLUCOSE-CAPILLARY: 188 mg/dL — AB (ref 70–99)
GLUCOSE-CAPILLARY: 166 mg/dL — AB (ref 70–99)
Glucose-Capillary: 106 mg/dL — ABNORMAL HIGH (ref 70–99)
Glucose-Capillary: 132 mg/dL — ABNORMAL HIGH (ref 70–99)
Glucose-Capillary: 163 mg/dL — ABNORMAL HIGH (ref 70–99)

## 2013-09-11 LAB — CBC WITH DIFFERENTIAL/PLATELET
Basophils Absolute: 0 10*3/uL (ref 0.0–0.1)
Basophils Relative: 0 % (ref 0–1)
EOS ABS: 0.1 10*3/uL (ref 0.0–0.7)
Eosinophils Relative: 1 % (ref 0–5)
HCT: 28.3 % — ABNORMAL LOW (ref 39.0–52.0)
Hemoglobin: 9.5 g/dL — ABNORMAL LOW (ref 13.0–17.0)
LYMPHS ABS: 1.2 10*3/uL (ref 0.7–4.0)
LYMPHS PCT: 11 % — AB (ref 12–46)
MCH: 30.2 pg (ref 26.0–34.0)
MCHC: 33.6 g/dL (ref 30.0–36.0)
MCV: 89.8 fL (ref 78.0–100.0)
Monocytes Absolute: 1 10*3/uL (ref 0.1–1.0)
Monocytes Relative: 9 % (ref 3–12)
Neutro Abs: 8.7 10*3/uL — ABNORMAL HIGH (ref 1.7–7.7)
Neutrophils Relative %: 79 % — ABNORMAL HIGH (ref 43–77)
PLATELETS: 176 10*3/uL (ref 150–400)
RBC: 3.15 MIL/uL — AB (ref 4.22–5.81)
RDW: 16.9 % — ABNORMAL HIGH (ref 11.5–15.5)
WBC: 11.1 10*3/uL — AB (ref 4.0–10.5)

## 2013-09-11 LAB — PREPARE RBC (CROSSMATCH)

## 2013-09-11 LAB — URINE CULTURE: Colony Count: 6000

## 2013-09-11 LAB — CBC
HCT: 22.7 % — ABNORMAL LOW (ref 39.0–52.0)
Hemoglobin: 7.6 g/dL — ABNORMAL LOW (ref 13.0–17.0)
MCH: 31.5 pg (ref 26.0–34.0)
MCHC: 33.5 g/dL (ref 30.0–36.0)
MCV: 94.2 fL (ref 78.0–100.0)
Platelets: 194 10*3/uL (ref 150–400)
RBC: 2.41 MIL/uL — ABNORMAL LOW (ref 4.22–5.81)
RDW: 17.2 % — ABNORMAL HIGH (ref 11.5–15.5)
WBC: 12.9 10*3/uL — ABNORMAL HIGH (ref 4.0–10.5)

## 2013-09-11 LAB — MAGNESIUM: Magnesium: 1.7 mg/dL (ref 1.5–2.5)

## 2013-09-11 LAB — PHOSPHORUS: PHOSPHORUS: 3.6 mg/dL (ref 2.3–4.6)

## 2013-09-11 MED ORDER — FAT EMULSION 20 % IV EMUL
240.0000 mL | INTRAVENOUS | Status: AC
  Administered 2013-09-11: 240 mL via INTRAVENOUS
  Filled 2013-09-11: qty 250

## 2013-09-11 MED ORDER — INSULIN ASPART 100 UNIT/ML ~~LOC~~ SOLN
0.0000 [IU] | SUBCUTANEOUS | Status: DC
  Administered 2013-09-11: 3 [IU] via SUBCUTANEOUS
  Administered 2013-09-11 (×2): 4 [IU] via SUBCUTANEOUS
  Administered 2013-09-12 (×4): 3 [IU] via SUBCUTANEOUS
  Administered 2013-09-12: 4 [IU] via SUBCUTANEOUS
  Administered 2013-09-12: 3 [IU] via SUBCUTANEOUS
  Administered 2013-09-13 (×2): 4 [IU] via SUBCUTANEOUS
  Administered 2013-09-13: 3 [IU] via SUBCUTANEOUS
  Administered 2013-09-13 – 2013-09-14 (×2): 4 [IU] via SUBCUTANEOUS
  Administered 2013-09-14: 3 [IU] via SUBCUTANEOUS
  Administered 2013-09-14 – 2013-09-15 (×6): 4 [IU] via SUBCUTANEOUS
  Administered 2013-09-15: 3 [IU] via SUBCUTANEOUS
  Administered 2013-09-15: 4 [IU] via SUBCUTANEOUS
  Administered 2013-09-15 – 2013-09-16 (×3): 3 [IU] via SUBCUTANEOUS
  Administered 2013-09-16 (×2): 4 [IU] via SUBCUTANEOUS
  Administered 2013-09-16: 3 [IU] via SUBCUTANEOUS
  Administered 2013-09-16 (×2): 4 [IU] via SUBCUTANEOUS
  Administered 2013-09-17: 7 [IU] via SUBCUTANEOUS
  Administered 2013-09-17: 3 [IU] via SUBCUTANEOUS
  Administered 2013-09-17 – 2013-09-18 (×4): 4 [IU] via SUBCUTANEOUS
  Administered 2013-09-18: 3 [IU] via SUBCUTANEOUS

## 2013-09-11 MED ORDER — PROMETHAZINE HCL 25 MG/ML IJ SOLN
12.5000 mg | INTRAMUSCULAR | Status: DC | PRN
  Administered 2013-09-11 – 2013-09-12 (×3): 12.5 mg via INTRAVENOUS
  Filled 2013-09-11 (×5): qty 1

## 2013-09-11 MED ORDER — CABERGOLINE 0.5 MG PO TABS: 0.2500 mg | ORAL_TABLET | ORAL | Status: DC

## 2013-09-11 MED ORDER — M.V.I. ADULT IV INJ
INJECTION | INTRAVENOUS | Status: AC
  Administered 2013-09-11: 18:00:00 via INTRAVENOUS
  Filled 2013-09-11: qty 3000

## 2013-09-11 MED ORDER — ONDANSETRON 8 MG/NS 50 ML IVPB
8.0000 mg | Freq: Once | INTRAVENOUS | Status: AC
  Administered 2013-09-11: 8 mg via INTRAVENOUS
  Filled 2013-09-11: qty 8

## 2013-09-11 MED ORDER — OXYCODONE-ACETAMINOPHEN 5-325 MG PO TABS: 1.0000 | ORAL_TABLET | ORAL | Status: DC | PRN

## 2013-09-11 MED ORDER — PROMETHAZINE HCL 25 MG/ML IJ SOLN
25.0000 mg | Freq: Four times a day (QID) | INTRAMUSCULAR | Status: DC | PRN
  Administered 2013-09-11 (×2): 25 mg via INTRAVENOUS
  Filled 2013-09-11 (×2): qty 1

## 2013-09-11 MED ORDER — METOCLOPRAMIDE HCL 5 MG/ML IJ SOLN
10.0000 mg | Freq: Four times a day (QID) | INTRAMUSCULAR | Status: AC
  Administered 2013-09-11 – 2013-09-13 (×9): 10 mg via INTRAVENOUS
  Filled 2013-09-11 (×11): qty 2

## 2013-09-11 MED ORDER — SODIUM CHLORIDE 0.9 % IV SOLN
INTRAVENOUS | Status: DC
  Administered 2013-09-11: 10:00:00 via INTRAVENOUS
  Administered 2013-09-12: 20 mL/h via INTRAVENOUS

## 2013-09-11 NOTE — Progress Notes (Signed)
[facility] PROGRESS NOTE  Brandon [name] NTI:[id number] DOB: 04/14/1955 DOA: 09/09/2013 PCP: [name], MD Brief Narrative: Patient is a 58 year old with history of metastatic rectal cancer who is status post cycle 14 of 5-FU with last chemotherapy administered reportedly 1 week prior to admission. Presented with main complaints near syncope, hematemesis, and blood in ostomy bag. GI to evaluate a patient with endoscopy and found duodenal ulcer most likely NSAID induced.    Assessment/Plan: Active Problems:   Anemia associated with acute blood loss - In context of patient with history of metastatic rectal cancer with diagnosis or duodenal ulcer 07/03/12 (Dr. Paulita Fujita) and with NSAIDs on his home medication list. - GI on board and recommendations were the following: Plan:  Continue PPI infusion, transfuse 2 more units packed red blood cells. If becomes unstable may need surgical consult, interventional radiology for possible arteriogram, doubt endoscopic therapy likely to be definitive due to the size and location of the ulcer.  - Currently patient stable and as listed above patient will receive 2 units of packed red blood cells. - Reassess hemoglobin levels next a.m. and after transfusion. - If continued bleeding with continued drop in hemoglobin we'll plan on consult in either surgery or interventional radiology.    Rectal cancer/Metastatic carcinoma - Once discharged from hospital patient can follow up with oncologist at [facility] for further evaluation - Pt currently on TPN  Code Status: DNR Family Communication: Discussed with patient and father at bedside. Disposition Plan: Pending further work up and recommendations from GI standpoint.   Consultants:  GI: Dr. Amedeo Plenty  Procedures:  none  Antibiotics:  None  HPI/Subjective: Pt states that pain is starting to be well controlled. After I left the room nursing paged me several minutes later to indicate that patient  had bout of emesis.  Objective: Filed Vitals:   09/11/13 1448  BP:   Pulse:   Temp:   Resp: 22    Intake/Output Summary (Last 24 hours) at 09/11/13 1530 Last data filed at 09/11/13 1400  Gross per 24 hour  Intake 2449.52 ml  Output   2950 ml  Net -500.48 ml   Filed Weights   09/09/13 1151  Weight: 71.6 kg (157 lb 13.6 oz)    Exam:   General:  Pt in NAD, alert and awake  Cardiovascular: RRR, no MRG  Respiratory: CTA BL, no wheezes  Abdomen: soft, ND  Musculoskeletal: no cyanosis or clubbing.   Data Reviewed: Basic Metabolic Panel:  Recent Labs Lab 09/09/13 0845 09/10/13 0550 09/11/13 0515  NA 135* 137 134*  K 4.6 4.3 4.1  CL 101 100 98  CO2 24 28 27   GLUCOSE 174* 146* 176*  BUN 37* 30* 24*  CREATININE 0.81 0.70 0.75  CALCIUM 7.8* 7.7* 7.7*  MG  --  1.8 1.7  PHOS  --  3.3 3.6   Liver Function Tests:  Recent Labs Lab 09/09/13 0845 09/10/13 0550  AST 53* 15  ALT 88* 60*  ALKPHOS 135* 113  BILITOT 0.5 0.3  PROT 5.0* 5.1*  ALBUMIN 2.2* 2.2*   No results found for this basename: LIPASE, AMYLASE,  in the last 168 hours No results found for this basename: AMMONIA,  in the last 168 hours CBC:  Recent Labs Lab 09/09/13 0845 09/09/13 2130 09/10/13 0550 09/10/13 1130 09/10/13 2115 09/11/13 0150  WBC 34.7* 15.6* 14.0* 12.2* 15.0* 12.9*  NEUTROABS 29.8*  --  10.9*  --   --   --   HGB 7.3* 10.0* 9.2*  9.3* 7.9* 7.6*  HCT 22.2* 29.7* 28.2* 27.5* 23.0* 22.7*  MCV 101.8* 92.8 93.4 92.3 92.7 94.2  PLT 325 232 232 219 220 194   Cardiac Enzymes: No results found for this basename: CKTOTAL, CKMB, CKMBINDEX, TROPONINI,  in the last 168 hours BNP (last 3 results) No results found for this basename: PROBNP,  in the last 8760 hours CBG:  Recent Labs Lab 09/10/13 1202 09/10/13 1806 09/11/13 09/11/13 0602 09/11/13 1140  GLUCAP 182* 174* 163* 188* 132*    Recent Results (from the past 240 hour(s))  CULTURE, BLOOD (ROUTINE X 2)     Status: None    Collection Time    09/09/13  8:45 AM      Result Value Ref Range Status   Specimen Description BLOOD PICC LINE   Final   Special Requests BOTTLES DRAWN AEROBIC AND ANAEROBIC 5CC EACHA   Final   Culture  Setup Time     Final   Value: 09/09/2013 14:17     Performed at [facility]   Culture     Final   Value:        BLOOD CULTURE RECEIVED NO GROWTH TO DATE CULTURE WILL BE HELD FOR 5 DAYS BEFORE ISSUING A FINAL NEGATIVE REPORT     Performed at [facility]   Report Status PENDING   Incomplete  CULTURE, BLOOD (ROUTINE X 2)     Status: None   Collection Time    09/09/13  9:23 AM      Result Value Ref Range Status   Specimen Description BLOOD RIGHT HAND   Final   Special Requests BOTTLES DRAWN AEROBIC AND ANAEROBIC 5CC EACH   Final   Culture  Setup Time     Final   Value: 09/09/2013 14:16     Performed at [facility]   Culture     Final   Value:        BLOOD CULTURE RECEIVED NO GROWTH TO DATE CULTURE WILL BE HELD FOR 5 DAYS BEFORE ISSUING A FINAL NEGATIVE REPORT     Performed at [facility]   Report Status PENDING   Incomplete  MRSA PCR SCREENING     Status: None   Collection Time    09/09/13 11:58 AM      Result Value Ref Range Status   MRSA by PCR NEGATIVE  NEGATIVE Final   Comment:            The GeneXpert MRSA Assay (FDA     approved for NASAL specimens     only), is one component of a     comprehensive MRSA colonization     surveillance program. It is not     intended to diagnose MRSA     infection nor to guide or     monitor treatment for     MRSA infections.  URINE CULTURE     Status: None   Collection Time    09/10/13  3:20 AM      Result Value Ref Range Status   Specimen Description URINE, RANDOM   Final   Special Requests NONE   Final   Culture  Setup Time     Final   Value: 09/10/2013 09:26     Performed at [facility]     Final   Value: 6,000 COLONIES/ML     Performed at [facility]    Culture     Final   Value: INSIGNIFICANT  GROWTH     Performed at [facility]   Report Status 09/11/2013 FINAL   Final     Studies: No results found.  Scheduled Meds: . antiseptic oral rinse  15 mL Mouth Rinse q12n4p  . [START ON 09/13/2013] cabergoline  0.25 mg Oral Once per day on Mon Thu  . chlorhexidine  15 mL Mouth Rinse BID  . dexamethasone  4 mg Oral Q breakfast  . fentaNYL  100 mcg Transdermal Q72H  . fentaNYL  100 mcg Transdermal Q72H  . fentaNYL  100 mcg Transdermal Q72H  . fentaNYL  100 mcg Transdermal Once  . fentaNYL  100 mcg Transdermal Once  . gabapentin  300 mg Oral TID  . HYDROmorphone PCA 0.3 mg/mL   Intravenous 6 times per day  . insulin aspart  0-20 Units Subcutaneous 6 times per day  . metoCLOPramide (REGLAN) injection  10 mg Intravenous 4 times per day  . octreotide  100 mcg Intravenous 3 times per day  . [START ON 09/14/2013] pantoprazole (PROTONIX) IV  40 mg Intravenous Q12H  . tamsulosin  0.4 mg Oral QHS   Continuous Infusions: . sodium chloride 20 mL/hr at 09/11/13 0940  . Marland KitchenTPN (CLINIMIX-E) Adult 90 mL/hr at 09/10/13 1800   And  . fat emulsion 240 mL (09/10/13 1800)  . Marland KitchenTPN (CLINIMIX-E) Adult     And  . fat emulsion    . pantoprozole (PROTONIX) infusion 8 mg/hr (09/11/13 1255)     Time spent: > 35 minutes    [facility] Pager [phone] If 7PM-7AM, please contact night-coverage at [email or web address], password [facility] 09/11/2013, 3:30 PM  LOS: 2 days

## 2013-09-11 NOTE — Progress Notes (Signed)
Chaney Malling, NP called to get PRn phenergen order changed from PO to IV if possible. RN told MD that pt has had two bloody emesis this evening . One 325cc and the other 475cc. Pts hgb did drop from 9.3 at 1130 6/1 to 7.9 at 2115 6/1. NP aware and will repeat CBC at 0200 on 6/2. NP switched PO phenergen to IV. Pt is stable. Will continue to monitor.

## 2013-09-11 NOTE — Progress Notes (Signed)
Pt received first unit of two of PRBCs without any s/s of complications; tolerated well; second unit transfusing now; Pt colostomy emptied with approx 100cc of continuing maroon blood noted; no further bloody emesis at this time or throughout the dayshift; nausea medication given as needed

## 2013-09-11 NOTE — Progress Notes (Signed)
PARENTERAL NUTRITION CONSULT NOTE - Follow Up  Pharmacy Consult for TPN Indication: Short bowel d/t rectal cancer  Allergies  Allergen Reactions  . Codeine Nausea And Vomiting  . Effexor [Venlafaxine Hcl] Other (See Comments)    Flat Feeling  . Imuran [Azathioprine] Nausea And Vomiting    Patient Measurements: Height: 5\' 9"  (175.3 cm) Weight: 157 lb 13.6 oz (71.6 kg) IBW/kg (Calculated) : 70.7  Vital Signs: Temp: 99.6 F (37.6 C) (06/02 0400) Temp src: Oral (06/02 0400) BP: 139/96 mmHg (06/02 0700) Pulse Rate: 81 (06/02 0700) Intake/Output from previous day: 06/01 0701 - 06/02 0700 In: 2333.3 [I.V.:1133.3; IV Piggyback:200; TPN:1000] Out: 3600 [Urine:1100; Emesis/NG output:1550; Stool:950] Intake/Output from this shift:    Labs:  Recent Labs  09/09/13 0845  09/10/13 1130 09/10/13 2115 09/11/13 0150  WBC 34.7*  < > 12.2* 15.0* 12.9*  HGB 7.3*  < > 9.3* 7.9* 7.6*  HCT 22.2*  < > 27.5* 23.0* 22.7*  PLT 325  < > 219 220 194  INR 0.99  --   --   --   --   < > = values in this interval not displayed.   Recent Labs  09/09/13 0845 09/10/13 0550 09/11/13 0515  NA 135* 137 134*  K 4.6 4.3 4.1  CL 101 100 98  CO2 24 28 27   GLUCOSE 174* 146* 176*  BUN 37* 30* 24*  CREATININE 0.81 0.70 0.75  CALCIUM 7.8* 7.7* 7.7*  MG  --  1.8 1.7  PHOS  --  3.3 3.6  PROT 5.0* 5.1*  --   ALBUMIN 2.2* 2.2*  --   AST 53* 15  --   ALT 88* 60*  --   ALKPHOS 135* 113  --   BILITOT 0.5 0.3  --   PREALBUMIN  --  23.1  --   TRIG  --  163*  --    Estimated Creatinine Clearance: 101.9 ml/min (by C-G formula based on Cr of 0.75).    Recent Labs  09/10/13 1806 09/11/13 09/11/13 0602  GLUCAP 174* 163* 188*    Insulin Requirements in the past 24 hours:  12 units SSI  Current Nutrition:  NPO Clinimix E 5/15 at a goal rate of 89ml/hr + 20% fat emulsion at 77ml/hr continuous infusions  IVF: D5-NS at 20 ml/hr  Assessment: [name] w/ metastatic rectal cancer on TPN at home,  admitted w/ syncope and vomiting blood. Also on an ambulatory Dilaudid PCA and getting IV chemotherapy at home via [facility]. Managed by MDs at [facility] was to tx to [facility] when stabilized; however, pt continues to vomit significant amounts of blood and bleed from colostomy site which is being followed and treated by GI.  Recently was considering hospice. Pharmacy is asked to dose TPN while inpatient.    Glucose - CBGs above goal (< 150 mg/dL).  Adjust SSI to resistant scale q4h and change IVF from D5-NS to NS.  Electrolytes - K/Mag/Phos WNL.  Na 134 (unable to correct in TNA).  Corr Ca 9.14.  Other lytes ok.  Renal - BUN slightly elevated but improving, SCr WNL  LFTs - ALT elevated (6/1) but improving  TGs - 163  Prealbumin - 23.1  Nutritional Goals:   RD's Estimated Nutritional Needs (6/1): Kcal: 2300-2500, Protein: 107-120 grams, Fluid: >/=2.3 L/day   TPN PTA: Cyclic infusion (7L-8X), 2531ml delivered. Bag includes lipids one day a week. Daily avg: 1824Kcal and 96g protein. Standard MVI/TE. No insulin. Octreotide 330mcg/bag.  RD's recommendations this admission  are higher than what patient is receiving in home TNA.  Will adjust inpatient TNA to meet RD's goals.  Clinimix E 5/20 at a goal rate of 63ml/hr + 20% fat emulsion at 72ml/hr to provide: 108g/day protein, 2380Kcal/day.  Providing over continuous infusion over 24 hours for now (as opposed to cycling over 12 hours) due to acute illness and increased nutrition goals.  Will provide octreotide 100 mcg IV TID as a separate order to replace octreotide administered through home TNA.  TPN Access: PICC TPN day#: 3 (inpatient), has been on TPN for 9 months per patient report  Plan: At 1800:  Continue Clinimix E 5/20 at 90 ml/hr.    20% fat emulsion at 18ml/hr.  TNA to contain standard multivitamins and trace elements.  IVF reduced to 20 ml/hr to account for volume provided by TNA.  Will switch D5-NS to NS since CBGs remain  elevated.  Adjust SSI to resistant scale q4h.   TNA lab panels on Mondays & Thursdays.  BMET in AM.  F/u daily.  Hershal Coria, PharmD, BCPS Pager: [phone] 09/11/2013 7:51 AM

## 2013-09-11 NOTE — Progress Notes (Signed)
[facility] Progress Note  Subjective: 4 episodes of coffee-ground emesis last night with maroon stool in the ostomy.  Objective: Vital signs in last 24 hours: Temp:  [97.7 F (36.5 C)-99.6 F (37.6 C)] 97.7 F (36.5 C) (06/02 0741) Pulse Rate:  [64-140] 81 (06/02 0700) Resp:  [8-27] 10 (06/02 0741) BP: (96-166)/(66-96) 139/96 mmHg (06/02 0700) SpO2:  [96 %-100 %] 100 % (06/02 0741) Weight change:    PE: Pale alert oriented slightly nauseated  Lab Results: Results for orders placed during the hospital encounter of 09/09/13 (from the past 24 hour(s))  CBC     Status: Abnormal   Collection Time    09/10/13 11:30 AM      Result Value Ref Range   WBC 12.2 (*) 4.0 - 10.5 K/uL   RBC 2.98 (*) 4.22 - 5.81 MIL/uL   Hemoglobin 9.3 (*) 13.0 - 17.0 g/dL   HCT 27.5 (*) 39.0 - 52.0 %   MCV 92.3  78.0 - 100.0 fL   MCH 31.2  26.0 - 34.0 pg   MCHC 33.8  30.0 - 36.0 g/dL   RDW 17.9 (*) 11.5 - 15.5 %   Platelets 219  150 - 400 K/uL  GLUCOSE, CAPILLARY     Status: Abnormal   Collection Time    09/10/13 12:02 PM      Result Value Ref Range   Glucose-Capillary 182 (*) 70 - 99 mg/dL  GLUCOSE, CAPILLARY     Status: Abnormal   Collection Time    09/10/13  6:06 PM      Result Value Ref Range   Glucose-Capillary 174 (*) 70 - 99 mg/dL   Comment 1 Documented in Chart     Comment 2 Notify RN    CBC     Status: Abnormal   Collection Time    09/10/13  9:15 PM      Result Value Ref Range   WBC 15.0 (*) 4.0 - 10.5 K/uL   RBC 2.48 (*) 4.22 - 5.81 MIL/uL   Hemoglobin 7.9 (*) 13.0 - 17.0 g/dL   HCT 23.0 (*) 39.0 - 52.0 %   MCV 92.7  78.0 - 100.0 fL   MCH 31.9  26.0 - 34.0 pg   MCHC 34.3  30.0 - 36.0 g/dL   RDW 17.3 (*) 11.5 - 15.5 %   Platelets 220  150 - 400 K/uL  GLUCOSE, CAPILLARY     Status: Abnormal   Collection Time    09/11/13 12:00 AM      Result Value Ref Range   Glucose-Capillary 163 (*) 70 - 99 mg/dL  CBC     Status: Abnormal   Collection Time    09/11/13  1:50 AM       Result Value Ref Range   WBC 12.9 (*) 4.0 - 10.5 K/uL   RBC 2.41 (*) 4.22 - 5.81 MIL/uL   Hemoglobin 7.6 (*) 13.0 - 17.0 g/dL   HCT 22.7 (*) 39.0 - 52.0 %   MCV 94.2  78.0 - 100.0 fL   MCH 31.5  26.0 - 34.0 pg   MCHC 33.5  30.0 - 36.0 g/dL   RDW 17.2 (*) 11.5 - 15.5 %   Platelets 194  150 - 400 K/uL  BASIC METABOLIC PANEL     Status: Abnormal   Collection Time    09/11/13  5:15 AM      Result Value Ref Range   Sodium 134 (*) 137 - 147 mEq/L   Potassium 4.1  3.7 -  5.3 mEq/L   Chloride 98  96 - 112 mEq/L   CO2 27  19 - 32 mEq/L   Glucose, Bld 176 (*) 70 - 99 mg/dL   BUN 24 (*) 6 - 23 mg/dL   Creatinine, Ser 0.75  0.50 - 1.35 mg/dL   Calcium 7.7 (*) 8.4 - 10.5 mg/dL   GFR calc non Af Amer >90  >90 mL/min   GFR calc Af Amer >90  >90 mL/min  MAGNESIUM     Status: None   Collection Time    09/11/13  5:15 AM      Result Value Ref Range   Magnesium 1.7  1.5 - 2.5 mg/dL  PHOSPHORUS     Status: None   Collection Time    09/11/13  5:15 AM      Result Value Ref Range   Phosphorus 3.6  2.3 - 4.6 mg/dL  GLUCOSE, CAPILLARY     Status: Abnormal   Collection Time    09/11/13  6:02 AM      Result Value Ref Range   Glucose-Capillary 188 (*) 70 - 99 mg/dL    Studies/Results: No results found.    Assessment: GI bleeding from large duodenal ulcer, NSAID related, suspect ongoing bleeding with tachycardia but stable blood pressure  Plan: Continue PPI infusion, transfuse 2 more units packed red blood cells. If becomes unstable may need surgical consult, interventional radiology for possible arteriogram, doubt endoscopic therapy likely to be definitive due to the size and location of the ulcer.    Missy Sabins 09/11/2013, 9:39 AM

## 2013-09-12 ENCOUNTER — Inpatient Hospital Stay (HOSPITAL_COMMUNITY): Payer: BC Managed Care – PPO

## 2013-09-12 ENCOUNTER — Encounter (HOSPITAL_COMMUNITY): Payer: Self-pay | Admitting: Radiology

## 2013-09-12 DIAGNOSIS — R112 Nausea with vomiting, unspecified: Secondary | ICD-10-CM

## 2013-09-12 DIAGNOSIS — C20 Malignant neoplasm of rectum: Secondary | ICD-10-CM

## 2013-09-12 DIAGNOSIS — K922 Gastrointestinal hemorrhage, unspecified: Secondary | ICD-10-CM

## 2013-09-12 DIAGNOSIS — C801 Malignant (primary) neoplasm, unspecified: Secondary | ICD-10-CM

## 2013-09-12 LAB — CBC
HCT: 27.4 % — ABNORMAL LOW (ref 39.0–52.0)
Hemoglobin: 9.2 g/dL — ABNORMAL LOW (ref 13.0–17.0)
MCH: 30.5 pg (ref 26.0–34.0)
MCHC: 33.6 g/dL (ref 30.0–36.0)
MCV: 90.7 fL (ref 78.0–100.0)
Platelets: 173 10*3/uL (ref 150–400)
RBC: 3.02 MIL/uL — ABNORMAL LOW (ref 4.22–5.81)
RDW: 17.4 % — ABNORMAL HIGH (ref 11.5–15.5)
WBC: 11.4 10*3/uL — ABNORMAL HIGH (ref 4.0–10.5)

## 2013-09-12 LAB — TYPE AND SCREEN
ABO/RH(D): O POS
ANTIBODY SCREEN: NEGATIVE
UNIT DIVISION: 0
Unit division: 0
Unit division: 0
Unit division: 0

## 2013-09-12 LAB — TROPONIN I
Troponin I: <0.3 ng/mL (ref <0.30)
Troponin I: <0.3 ng/mL (ref <0.30)
Troponin I: <0.3 ng/mL (ref <0.30)

## 2013-09-12 LAB — GLUCOSE, CAPILLARY
GLUCOSE-CAPILLARY: 150 mg/dL — AB (ref 70–99)
GLUCOSE-CAPILLARY: 129 mg/dL — AB (ref 70–99)
Glucose-Capillary: 119 mg/dL — ABNORMAL HIGH (ref 70–99)
Glucose-Capillary: 130 mg/dL — ABNORMAL HIGH (ref 70–99)
Glucose-Capillary: 133 mg/dL — ABNORMAL HIGH (ref 70–99)
Glucose-Capillary: 150 mg/dL — ABNORMAL HIGH (ref 70–99)

## 2013-09-12 LAB — BASIC METABOLIC PANEL
BUN: 21 mg/dL (ref 6–23)
CALCIUM: 7.5 mg/dL — AB (ref 8.4–10.5)
CO2: 24 mEq/L (ref 19–32)
Chloride: 98 mEq/L (ref 96–112)
Creatinine, Ser: 0.75 mg/dL (ref 0.50–1.35)
GFR calc non Af Amer: >90 mL/min (ref >90)
Glucose, Bld: 145 mg/dL — ABNORMAL HIGH (ref 70–99)
POTASSIUM: 3.7 meq/L (ref 3.7–5.3)
Sodium: 132 mEq/L — ABNORMAL LOW (ref 137–147)

## 2013-09-12 LAB — HEMOGLOBIN AND HEMATOCRIT, BLOOD
HCT: 28.3 % — ABNORMAL LOW (ref 39.0–52.0)
HCT: 27.2 % — ABNORMAL LOW (ref 39.0–52.0)
HEMOGLOBIN: 9.4 g/dL — AB (ref 13.0–17.0)
Hemoglobin: 9.1 g/dL — ABNORMAL LOW (ref 13.0–17.0)

## 2013-09-12 LAB — OCCULT BLOOD X 1 CARD TO LAB, STOOL: Fecal Occult Bld: POSITIVE — AB

## 2013-09-12 MED ORDER — ALTEPLASE 2 MG IJ SOLR
2.0000 mg | Freq: Once | INTRAMUSCULAR | Status: AC
  Administered 2013-09-12: 2 mg
  Filled 2013-09-12: qty 2

## 2013-09-12 MED ORDER — DIPHENHYDRAMINE HCL 12.5 MG/5ML PO ELIX: 12.5000 mg | ORAL_SOLUTION | Freq: Four times a day (QID) | ORAL | Status: DC | PRN

## 2013-09-12 MED ORDER — IOHEXOL 350 MG/ML SOLN
100.0000 mL | Freq: Once | INTRAVENOUS | Status: AC | PRN
  Administered 2013-09-12: 100 mL via INTRAVENOUS

## 2013-09-12 MED ORDER — FAT EMULSION 20 % IV EMUL
240.0000 mL | INTRAVENOUS | Status: AC
  Administered 2013-09-12: 240 mL via INTRAVENOUS
  Filled 2013-09-12: qty 250

## 2013-09-12 MED ORDER — SODIUM CHLORIDE 0.9 % IJ SOLN: 9.0000 mL | INTRAMUSCULAR | Status: DC | PRN

## 2013-09-12 MED ORDER — DIPHENHYDRAMINE HCL 50 MG/ML IJ SOLN: 12.5000 mg | Freq: Four times a day (QID) | INTRAMUSCULAR | Status: DC | PRN

## 2013-09-12 MED ORDER — NALOXONE HCL 0.4 MG/ML IJ SOLN: 0.4000 mg | INTRAMUSCULAR | Status: DC | PRN

## 2013-09-12 MED ORDER — TECHNETIUM TC 99M-LABELED RED BLOOD CELLS IV KIT
26.7000 | PACK | Freq: Once | INTRAVENOUS | Status: AC | PRN
  Administered 2013-09-12: 27 via INTRAVENOUS

## 2013-09-12 MED ORDER — SODIUM CHLORIDE 0.9 % IV BOLUS (SEPSIS)
1000.0000 mL | Freq: Once | INTRAVENOUS | Status: AC
  Administered 2013-09-12: 1000 mL via INTRAVENOUS

## 2013-09-12 MED ORDER — PROMETHAZINE HCL 25 MG/ML IJ SOLN
25.0000 mg | INTRAMUSCULAR | Status: DC | PRN
  Administered 2013-09-12 – 2013-09-18 (×9): 25 mg via INTRAVENOUS
  Filled 2013-09-12 (×8): qty 1

## 2013-09-12 MED ORDER — TRACE MINERALS CR-CU-F-FE-I-MN-MO-SE-ZN IV SOLN
INTRAVENOUS | Status: AC
  Administered 2013-09-12: 17:00:00 via INTRAVENOUS
  Filled 2013-09-12: qty 3000

## 2013-09-12 MED ORDER — HYDROMORPHONE 0.3 MG/ML IV SOLN
INTRAVENOUS | Status: DC
  Administered 2013-09-12: 17:00:00 via INTRAVENOUS
  Administered 2013-09-12: 6.99 mg via INTRAVENOUS
  Administered 2013-09-12: 9.55 mg via INTRAVENOUS
  Administered 2013-09-12: 23:00:00 via INTRAVENOUS
  Administered 2013-09-12: 7.44 mg via INTRAVENOUS
  Administered 2013-09-12: 13:00:00 via INTRAVENOUS
  Administered 2013-09-13: 2.1 mg via INTRAVENOUS
  Filled 2013-09-12 (×3): qty 25

## 2013-09-12 MED ORDER — POTASSIUM CHLORIDE 10 MEQ/100ML IV SOLN
10.0000 meq | Freq: Once | INTRAVENOUS | Status: AC
  Administered 2013-09-12: 10 meq via INTRAVENOUS
  Filled 2013-09-12: qty 100

## 2013-09-12 MED ORDER — SODIUM CHLORIDE 0.9 % IV SOLN
25.0000 mg | Freq: Two times a day (BID) | INTRAVENOUS | Status: DC | PRN
  Administered 2013-09-12 (×2): 25 mg via INTRAVENOUS
  Filled 2013-09-12 (×3): qty 1

## 2013-09-12 NOTE — Progress Notes (Addendum)
Progress Note  Brandon Brandon YJE:[id number] DOB: Mar 20, 1956 DOA: 09/09/2013 PCP: Brandon Screws, MD  Admit HPI / Brief Narrative: 58yo WM PMHx  metastatic rectal cancer who is status post cycle 14 of 5-FU with last chemotherapy administered reportedly 1 week prior to admission. Presented with main complaints near syncope, hematemesis, and blood in ostomy bag. GI to evaluate a patient with endoscopy and found duodenal ulcer most likely NSAID induced.   HPI/Subjective: 6/3 positive hematemesis this a.m., continue refractory N./V. negative CP, negative SOB   Assessment/Plan: Anemia associated with acute blood loss  - In context of patient with history of metastatic rectal cancer with diagnosis or duodenal ulcer 07/03/12 (Dr. Paulita Fujita) and with NSAIDs on his home medication list.  - GI on board and recommendations were the following:  -Continue Protonix infusion  -Patient is contrast used a total of 4 units PRBC (5/31 2 units, 6/2 units.)  -6/3 patient with significant amount of emesis, frank clots, colostomy bag with dark black liquid most likely positive for blood.  -6/3 Occult blood card positive. -Serial H./H. every 8 hours -GI requesting RBC scan.  Rectal cancer/Metastatic carcinoma  - Per care everywhere notes  patient has extensive metastatic disease to include mass effect upon the third portion of the duodenum; see results below  -Once discharged from hospital patient can follow up with oncologist at [facility] for further evaluation  - Pt currently on TPN -Spoke with  Dr. Marcell Anger (oncology) at [facility] [phone]; from patient earlier scan to [facility] appears his refractory N./V. may be secondary to partial bowel obstruction. We discussed his recent CT abdominal/pelvic on 07/23/2013 and she concurred that part of his problem may be partial SBO/SBO secondary to extrinsic pressure from the mass; see CT results below. She also informed me that patient approximately a year ago  was on hospice, and subsequently decided after he had slight improvement came off hospice. He currently was receiving 5FU therapy.  Refractory nausea/vomiting -Increase Phenergan IV to 25 mg q 4 hr PRN N./V., if patient fails to respond to this treatment Thorazine IV 25 mg BID PRN    Code Status: FULL Family Communication: no family present at time of exam Disposition Plan: Resolution GI bleed    Consultants: Dr. Teena Irani (GI)     Procedure/Significant Events: Rectal cancer ([place]) 02/21/2012  Overview:  1. Hx Crohn's disease dx 1977 2. 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 03/2007 - rectal pain 4. 03/23/07 - flex sig - rectal mass - biopsy negative 5. Referred to [facility]  6. 03/18/07 CT AP - thickening of rectal stump 7. 03/29/07 1.1 cm rectal wall thickening with midrectal mass 2 and 1.8 cm on MRI  8. Biopsy invasive poorly differentiated adenocarcinoma; CEA 7.2 9. Underwent perianal fistulectomy 10. 04/27/07 PET scan - hypermetabolic rectal neoplasm 11. Started chemorad with Xeloda and 4500 cGy 05/08/07-06/15/07  12. Underwent abdominoperitoneal resection with end ileostomy 08/23/07 [facility] 13. 07/06/12 CT chest - neg 14. 10/02/12 CT AP - progressive mesenteric masses - rt 3.6 x 2.1 cm, 1.8 x 2.4 cm surrounding mesenteric artery 15. 11/21/12 CT AP - progressive mesenteric mets  CT Chest Abdomen Pelvis W Infusion (07/23/2013 11:38 AM EDT) at [facility] -slight increase in size of infiltrative tumor w/i central mesentery, involving numerous vascular structures, -progressive mass effect upon 3rd  portion duodenum, with increasing proximal gastric and duodenal luminal distention.  -Slight increase in discrete central mesenteric lymph nodes.  -bilateral ureteral strictures, with persistent bilateral hydroureteronephrosis. -  Tree-in-bud micronodularity has developed LLL. Favors small airways/infectious disease etiology. .  6/1 Endoscopy; giant duodenal ulcer  and moderate sized prepyloric ulcer, some stigmata of hemorrhage associated with a larger ulcer  6/3 EKG; compared to EKG 04/14/2012  -NSR, incomplete RBBB, flipped T waves in V2/V3 which were not present on the previous EKG      Culture 5/31 blood PICC line/right hand negative 5/31 MRSA By PCR negative 6/1 urine negative   Antibiotics:  NA   DVT prophylaxis:  SCD    Devices  NA    LINES / TUBES:  ??/?? Double lumen PICC on the right  ??/?? Midline single lumen PICC  6/1 20 ga left posterior hand 6/2  22 ga left hand      Continuous Infusions: . sodium chloride 20 mL/hr at 09/11/13 0940  . Marland KitchenTPN (CLINIMIX-E) Adult 90 mL/hr at 09/11/13 2000   And  . fat emulsion 240 mL (09/11/13 1733)  . Marland KitchenTPN (CLINIMIX-E) Adult     And  . fat emulsion    . pantoprozole (PROTONIX) infusion 8 mg/hr (09/12/13 1100)    Objective: VITAL SIGNS: Temp: 98.7 F (37.1 C) (06/03 0813) Temp src: Oral (06/03 0813) BP: 149/91 mmHg (06/03 1000) Pulse Rate: 130 (06/03 1100) SPO2; 100% on 2 L O2 via [place]  FIO2:   Intake/Output Summary (Last 24 hours) at 09/12/13 1205 Last data filed at 09/12/13 1100  Gross per 24 hour  Intake 4270.07 ml  Output   1805 ml  Net 2465.07 ml     Exam: General: A./O. x4, moderate distress secondary to intractable N./V. No acute respiratory distress Lungs: Clear to auscultation bilaterally without wheezes or crackles Cardiovascular: Tachycardic, Regular rhythm without murmur gallop or rub normal S1 and S2 Abdomen: Nontender, nondistended, soft, bowel sounds positive, no rebound, no ascites, no appreciable mass, 2x  Colostomy bag in place, larger midline back full of black fluid (bile + blood?)  Extremities: No significant cyanosis, clubbing, or edema bilateral lower extremities  Data Reviewed: Basic Metabolic Panel:  Recent Labs Lab 09/09/13 0845 09/10/13 0550 09/11/13 0515 09/12/13 0430  NA 135* 137 134* 132*  K 4.6 4.3 4.1 3.7  CL 101 100 98 98    CO2 24 28 27 24   GLUCOSE 174* 146* 176* 145*  BUN 37* 30* 24* 21  CREATININE 0.81 0.70 0.75 0.75  CALCIUM 7.8* 7.7* 7.7* 7.5*  MG  --  1.8 1.7  --   PHOS  --  3.3 3.6  --    Liver Function Tests:  Recent Labs Lab 09/09/13 0845 09/10/13 0550  AST 53* 15  ALT 88* 60*  ALKPHOS 135* 113  BILITOT 0.5 0.3  PROT 5.0* 5.1*  ALBUMIN 2.2* 2.2*   No results found for this basename: LIPASE, AMYLASE,  in the last 168 hours No results found for this basename: AMMONIA,  in the last 168 hours CBC:  Recent Labs Lab 09/09/13 0845  09/10/13 0550 09/10/13 1130 09/10/13 2115 09/11/13 0150 09/11/13 1759 09/12/13 0430 09/12/13 1101  WBC 34.7*  < > 14.0* 12.2* 15.0* 12.9* 11.1* 11.4*  --   NEUTROABS 29.8*  --  10.9*  --   --   --  8.7*  --   --   HGB 7.3*  < > 9.2* 9.3* 7.9* 7.6* 9.5* 9.2* 9.4*  HCT 22.2*  < > 28.2* 27.5* 23.0* 22.7* 28.3* 27.4* 28.3*  MCV 101.8*  < > 93.4 92.3 92.7 94.2 89.8 90.7  --   PLT 325  < >  232 219 220 194 176 173  --   < > = values in this interval not displayed. Cardiac Enzymes:  Recent Labs Lab 09/12/13 1101  TROPONINI <0.30   BNP (last 3 results) No results found for this basename: PROBNP,  in the last 8760 hours CBG:  Recent Labs Lab 09/11/13 1613 09/11/13 2007 09/12/13 0024 09/12/13 0408 09/12/13 0830  GLUCAP 166* 106* 150* 133* 150*    Recent Results (from the past 240 hour(s))  CULTURE, BLOOD (ROUTINE X 2)     Status: None   Collection Time    09/09/13  8:45 AM      Result Value Ref Range Status   Specimen Description BLOOD PICC LINE   Final   Special Requests BOTTLES DRAWN AEROBIC AND ANAEROBIC 5CC EACHA   Final   Culture  Setup Time     Final   Value: 09/09/2013 14:17     Performed at [facility]   Culture     Final   Value:        BLOOD CULTURE RECEIVED NO GROWTH TO DATE CULTURE WILL BE HELD FOR 5 DAYS BEFORE ISSUING A FINAL NEGATIVE REPORT     Performed at [facility]   Report Status PENDING   Incomplete   CULTURE, BLOOD (ROUTINE X 2)     Status: None   Collection Time    09/09/13  9:23 AM      Result Value Ref Range Status   Specimen Description BLOOD RIGHT HAND   Final   Special Requests BOTTLES DRAWN AEROBIC AND ANAEROBIC 5CC EACH   Final   Culture  Setup Time     Final   Value: 09/09/2013 14:16     Performed at [facility]   Culture     Final   Value:        BLOOD CULTURE RECEIVED NO GROWTH TO DATE CULTURE WILL BE HELD FOR 5 DAYS BEFORE ISSUING A FINAL NEGATIVE REPORT     Performed at [facility]   Report Status PENDING   Incomplete  MRSA PCR SCREENING     Status: None   Collection Time    09/09/13 11:58 AM      Result Value Ref Range Status   MRSA by PCR NEGATIVE  NEGATIVE Final   Comment:            The GeneXpert MRSA Assay (FDA     approved for NASAL specimens     only), is one component of a     comprehensive MRSA colonization     surveillance program. It is not     intended to diagnose MRSA     infection nor to guide or     monitor treatment for     MRSA infections.  URINE CULTURE     Status: None   Collection Time    09/10/13  3:20 AM      Result Value Ref Range Status   Specimen Description URINE, RANDOM   Final   Special Requests NONE   Final   Culture  Setup Time     Final   Value: 09/10/2013 09:26     Performed at [place]     Final   Value: 6,000 COLONIES/ML     Performed at [facility]   Culture     Final   Value: INSIGNIFICANT GROWTH     Performed at [facility]   Report Status 09/11/2013  FINAL   Final     Studies:  Recent x-ray studies have been reviewed in detail by the Attending Physician  Scheduled Meds:  Scheduled Meds: . antiseptic oral rinse  15 mL Mouth Rinse q12n4p  . [START ON 09/13/2013] cabergoline  0.25 mg Oral Once per day on Mon Thu  . chlorhexidine  15 mL Mouth Rinse BID  . dexamethasone  4 mg Oral Q breakfast  . fentaNYL  100 mcg Transdermal Q72H  . fentaNYL  100  mcg Transdermal Q72H  . fentaNYL  100 mcg Transdermal Q72H  . fentaNYL  100 mcg Transdermal Once  . fentaNYL  100 mcg Transdermal Once  . gabapentin  300 mg Oral TID  . HYDROmorphone PCA 0.3 mg/mL   Intravenous 6 times per day  . insulin aspart  0-20 Units Subcutaneous 6 times per day  . metoCLOPramide (REGLAN) injection  10 mg Intravenous 4 times per day  . octreotide  100 mcg Intravenous 3 times per day  . [START ON 09/14/2013] pantoprazole (PROTONIX) IV  40 mg Intravenous Q12H  . tamsulosin  0.4 mg Oral QHS    Time spent on care of this patient: 40 mins   Allie Bossier , MD   [facility] Office  [phone] Pager [phone]  On-Call/Text Page:      [email or web address]      password [email or web address]  If 7PM-7AM, please contact night-coverage [email or web address] Password [email or web address] 09/12/2013, 12:05 PM   LOS: 3 days

## 2013-09-12 NOTE — Progress Notes (Signed)
PARENTERAL NUTRITION CONSULT NOTE - Follow Up  Pharmacy Consult for TPN Indication: Short bowel d/t rectal cancer  Allergies  Allergen Reactions  . Codeine Nausea And Vomiting  . Effexor [Venlafaxine Hcl] Other (See Comments)    Flat Feeling  . Imuran [Azathioprine] Nausea And Vomiting    Patient Measurements: Height: 5\' 9"  (175.3 cm) Weight: 157 lb 13.6 oz (71.6 kg) IBW/kg (Calculated) : 70.7  Vital Signs: Temp: 98.3 F (36.8 C) (06/03 0400) Temp src: Oral (06/03 0400) BP: 154/90 mmHg (06/03 0600) Pulse Rate: 115 (06/03 0600) Intake/Output from previous day: 06/02 0701 - 06/03 0700 In: 4406.7 [P.O.:720; I.V.:951.7; IV Piggyback:100; TPN:2310] Out: 2530 [Urine:1250; Emesis/NG output:625; MHDQQ:229] Intake/Output from this shift:    Labs:  Recent Labs  09/09/13 0845  09/11/13 0150 09/11/13 1759 09/12/13 0430  WBC 34.7*  < > 12.9* 11.1* 11.4*  HGB 7.3*  < > 7.6* 9.5* 9.2*  HCT 22.2*  < > 22.7* 28.3* 27.4*  PLT 325  < > 194 176 173  INR 0.99  --   --   --   --   < > = values in this interval not displayed.   Recent Labs  09/09/13 0845 09/10/13 0550 09/11/13 0515 09/12/13 0430  NA 135* 137 134* 132*  K 4.6 4.3 4.1 3.7  CL 101 100 98 98  CO2 24 28 27 24   GLUCOSE 174* 146* 176* 145*  BUN 37* 30* 24* 21  CREATININE 0.81 0.70 0.75 0.75  CALCIUM 7.8* 7.7* 7.7* 7.5*  MG  --  1.8 1.7  --   PHOS  --  3.3 3.6  --   PROT 5.0* 5.1*  --   --   ALBUMIN 2.2* 2.2*  --   --   AST 53* 15  --   --   ALT 88* 60*  --   --   ALKPHOS 135* 113  --   --   BILITOT 0.5 0.3  --   --   PREALBUMIN  --  23.1  --   --   TRIG  --  163*  --   --    Estimated Creatinine Clearance: 101.9 ml/min (by C-G formula based on Cr of 0.75).    Recent Labs  09/11/13 2007 09/12/13 0024 09/12/13 0408  GLUCAP 106* 150* 133*    Insulin Requirements in the past 24 hours:  17 units SSI  Current Nutrition:  NPO Clinimix E 5/15 at a goal rate of 52ml/hr + 20% fat emulsion at 16ml/hr  continuous infusions  IVF: NS at 20 ml/hr  Assessment: 57yo M w/ metastatic rectal cancer on TPN at home, admitted w/ syncope and vomiting blood. Also on an ambulatory Dilaudid PCA and getting IV chemotherapy at home via [facility]. Managed by MDs at [facility] was to tx to [facility] when stabilized; however, pt continued to vomit significant amounts of blood and bleed from colostomy site which is being followed and treated by GI.  Recently was considering hospice. Pharmacy is asked to dose TPN while inpatient.    Glucose - CBGs improved since adjusting SSI to resistant scale q4h and changing IVF from D5-NS to NS but there were still 2 values slightly above goal.  Will monitor another day and consider adding insulin to TNA or adding Lantus.  Electrolytes - K/Mag/Phos WNL but K has been trending down.  Na 132, decreasing (unable to correct in premixed TNA).    Renal - SCr WNL  LFTs - ALT elevated (6/1) but improving  TGs - 163 (6/1)  Prealbumin - 23.1 (6/1)  Nutritional Goals:   RD's Estimated Nutritional Needs (6/1): Kcal: 2300-2500, Protein: 107-120 grams, Fluid: >/=2.3 L/day   TPN PTA: Cyclic infusion (4W-1U), 2540ml delivered. Bag includes lipids one day a week. Daily avg: 1824Kcal and 96g protein. Standard MVI/TE. No insulin. Octreotide 330mcg/bag.  RD's recommendations this admission are higher than what patient is receiving in home TNA.  Will adjust inpatient TNA to meet RD's goals.  Clinimix E 5/20 at a goal rate of 73ml/hr + 20% fat emulsion at 17ml/hr to provide: 108g/day protein, 2380Kcal/day.  Providing over continuous infusion over 24 hours for now (as opposed to cycling over 12 hours) due to acute illness and increased nutrition goals.  Will provide octreotide 100 mcg IV TID as a separate order to replace octreotide administered through home TNA.  TPN Access: PICC TPN day#: 4 (inpatient), has been on TPN for 9 months per patient report  Plan: At 1800:  Continue Clinimix E 5/20  at 90 ml/hr.    20% fat emulsion at 52ml/hr.  TNA to contain standard multivitamins and trace elements.  IVF reduced to 20 ml/hr to account for volume provided by TNA.   Continue SSI resistant scale q4h.   KCl 10 mEq IV x 1.  TNA lab panels on Mondays & Thursdays.    F/u daily.  Hershal Coria, PharmD, BCPS Pager: [phone] 09/12/2013 8:08 AM

## 2013-09-12 NOTE — Progress Notes (Addendum)
[facility] Progress Note  Subjective: Still has had some vomiting of coffee-ground and maroon emesis. Blood pressure is stable, still tachycardic proximally 120. Bloody output into his ostomy seems to have decreased some.  Objective: Vital signs in last 24 hours: Temp:  [98.2 F (36.8 C)-99.1 F (37.3 C)] 98.7 F (37.1 C) (06/03 0813) Pulse Rate:  [81-130] 130 (06/03 1100) Resp:  [11-25] 14 (06/03 1100) BP: (129-159)/(76-94) 149/91 mmHg (06/03 1000) SpO2:  [95 %-100 %] 100 % (06/03 1100) FiO2 (%):  [21 %-100 %] 21 % (06/03 0058) Weight change:    PE: Patient appears ill and nauseous, answers questions appropriately some bloody fluid in his ostomy bag.  Lab Results: Results for orders placed during the hospital encounter of 09/09/13 (from the past 24 hour(s))  GLUCOSE, CAPILLARY     Status: Abnormal   Collection Time    09/11/13 11:40 AM      Result Value Ref Range   Glucose-Capillary 132 (*) 70 - 99 mg/dL   Comment 1 Documented in Chart     Comment 2 Notify RN    GLUCOSE, CAPILLARY     Status: Abnormal   Collection Time    09/11/13  4:13 PM      Result Value Ref Range   Glucose-Capillary 166 (*) 70 - 99 mg/dL   Comment 1 Documented in Chart     Comment 2 Notify RN    CBC WITH DIFFERENTIAL     Status: Abnormal   Collection Time    09/11/13  5:59 PM      Result Value Ref Range   WBC 11.1 (*) 4.0 - 10.5 K/uL   RBC 3.15 (*) 4.22 - 5.81 MIL/uL   Hemoglobin 9.5 (*) 13.0 - 17.0 g/dL   HCT 28.3 (*) 39.0 - 52.0 %   MCV 89.8  78.0 - 100.0 fL   MCH 30.2  26.0 - 34.0 pg   MCHC 33.6  30.0 - 36.0 g/dL   RDW 16.9 (*) 11.5 - 15.5 %   Platelets 176  150 - 400 K/uL   Neutrophils Relative % 79 (*) 43 - 77 %   Neutro Abs 8.7 (*) 1.7 - 7.7 K/uL   Lymphocytes Relative 11 (*) 12 - 46 %   Lymphs Abs 1.2  0.7 - 4.0 K/uL   Monocytes Relative 9  3 - 12 %   Monocytes Absolute 1.0  0.1 - 1.0 K/uL   Eosinophils Relative 1  0 - 5 %   Eosinophils Absolute 0.1  0.0 - 0.7 K/uL    Basophils Relative 0  0 - 1 %   Basophils Absolute 0.0  0.0 - 0.1 K/uL  GLUCOSE, CAPILLARY     Status: Abnormal   Collection Time    09/11/13  8:07 PM      Result Value Ref Range   Glucose-Capillary 106 (*) 70 - 99 mg/dL   Comment 1 Documented in Chart     Comment 2 Notify RN    GLUCOSE, CAPILLARY     Status: Abnormal   Collection Time    09/12/13 12:24 AM      Result Value Ref Range   Glucose-Capillary 150 (*) 70 - 99 mg/dL   Comment 1 Documented in Chart     Comment 2 Notify RN    GLUCOSE, CAPILLARY     Status: Abnormal   Collection Time    09/12/13  4:08 AM      Result Value Ref Range   Glucose-Capillary 133 (*) 70 -  99 mg/dL   Comment 1 Documented in Chart     Comment 2 Notify RN    BASIC METABOLIC PANEL     Status: Abnormal   Collection Time    09/12/13  4:30 AM      Result Value Ref Range   Sodium 132 (*) 137 - 147 mEq/L   Potassium 3.7  3.7 - 5.3 mEq/L   Chloride 98  96 - 112 mEq/L   CO2 24  19 - 32 mEq/L   Glucose, Bld 145 (*) 70 - 99 mg/dL   BUN 21  6 - 23 mg/dL   Creatinine, Ser 0.75  0.50 - 1.35 mg/dL   Calcium 7.5 (*) 8.4 - 10.5 mg/dL   GFR calc non Af Amer >90  >90 mL/min   GFR calc Af Amer >90  >90 mL/min  CBC     Status: Abnormal   Collection Time    09/12/13  4:30 AM      Result Value Ref Range   WBC 11.4 (*) 4.0 - 10.5 K/uL   RBC 3.02 (*) 4.22 - 5.81 MIL/uL   Hemoglobin 9.2 (*) 13.0 - 17.0 g/dL   HCT 27.4 (*) 39.0 - 52.0 %   MCV 90.7  78.0 - 100.0 fL   MCH 30.5  26.0 - 34.0 pg   MCHC 33.6  30.0 - 36.0 g/dL   RDW 17.4 (*) 11.5 - 15.5 %   Platelets 173  150 - 400 K/uL  GLUCOSE, CAPILLARY     Status: Abnormal   Collection Time    09/12/13  8:30 AM      Result Value Ref Range   Glucose-Capillary 150 (*) 70 - 99 mg/dL   Comment 1 Documented in Chart     Comment 2 Notify RN    OCCULT BLOOD X 1 CARD TO LAB, STOOL     Status: Abnormal   Collection Time    09/12/13 10:25 AM      Result Value Ref Range   Fecal Occult Bld POSITIVE (*) NEGATIVE   HEMOGLOBIN AND HEMATOCRIT, BLOOD     Status: Abnormal   Collection Time    09/12/13 11:01 AM      Result Value Ref Range   Hemoglobin 9.4 (*) 13.0 - 17.0 g/dL   HCT 28.3 (*) 39.0 - 52.0 %    Studies/Results: No results found.    Assessment: GI bleeding presumed secondary to giant duodenal ulcer Metastatic colon cancer status post revision of colostomy  Plan: 1. Continue proton pump inhibitor 2. Transfuse as necessary 3. Obtain nuclear medicine pooled RBC scan to see if active bleeding. 4. If bleeding in the distribution of the duodenum persists we'll consult interventional radiology for possible arteriographic intervention. Given the large size of this ulcer I think endoscopic therapy is likely to be unsuccessful    Missy Sabins 09/12/2013, 11:25 AM

## 2013-09-12 NOTE — Progress Notes (Signed)
Patient refused placement of NG tube.  Dr. Amedeo Plenty spoke to the patient's father over the telephone.  Explained the Importance of the NG tube placement.  Father and son spoke as well.  Patient continues to refuse the NG tube placement.  Notified Dr. Sherral Hammers of refusal.

## 2013-09-12 NOTE — Progress Notes (Signed)
Spoke with Dr Lyn Hollingshead. Pooled RBC scan positive near ostomy but anatomy after ostomy very unclear to guide arteriography. Decided to obtain CT angiography to better define anatomy and rule out tumor eroding into duodenum

## 2013-09-13 DIAGNOSIS — Z515 Encounter for palliative care: Secondary | ICD-10-CM

## 2013-09-13 DIAGNOSIS — R609 Edema, unspecified: Secondary | ICD-10-CM

## 2013-09-13 DIAGNOSIS — G8929 Other chronic pain: Secondary | ICD-10-CM

## 2013-09-13 DIAGNOSIS — R1011 Right upper quadrant pain: Secondary | ICD-10-CM

## 2013-09-13 DIAGNOSIS — D62 Acute posthemorrhagic anemia: Secondary | ICD-10-CM

## 2013-09-13 LAB — CBC WITH DIFFERENTIAL/PLATELET
Basophils Absolute: 0 10*3/uL (ref 0.0–0.1)
Basophils Relative: 0 % (ref 0–1)
EOS ABS: 0.1 10*3/uL (ref 0.0–0.7)
EOS PCT: 1 % (ref 0–5)
HCT: 25.4 % — ABNORMAL LOW (ref 39.0–52.0)
HEMOGLOBIN: 8.4 g/dL — AB (ref 13.0–17.0)
LYMPHS ABS: 0.7 10*3/uL (ref 0.7–4.0)
Lymphocytes Relative: 8 % — ABNORMAL LOW (ref 12–46)
MCH: 30.4 pg (ref 26.0–34.0)
MCHC: 33.1 g/dL (ref 30.0–36.0)
MCV: 92 fL (ref 78.0–100.0)
MONO ABS: 0.8 10*3/uL (ref 0.1–1.0)
MONOS PCT: 10 % (ref 3–12)
Neutro Abs: 6.5 10*3/uL (ref 1.7–7.7)
Neutrophils Relative %: 81 % — ABNORMAL HIGH (ref 43–77)
Platelets: 186 10*3/uL (ref 150–400)
RBC: 2.76 MIL/uL — ABNORMAL LOW (ref 4.22–5.81)
RDW: 17.2 % — ABNORMAL HIGH (ref 11.5–15.5)
WBC: 8.1 10*3/uL (ref 4.0–10.5)

## 2013-09-13 LAB — GLUCOSE, CAPILLARY
GLUCOSE-CAPILLARY: 173 mg/dL — AB (ref 70–99)
GLUCOSE-CAPILLARY: 160 mg/dL — AB (ref 70–99)
GLUCOSE-CAPILLARY: 175 mg/dL — AB (ref 70–99)
Glucose-Capillary: 143 mg/dL — ABNORMAL HIGH (ref 70–99)
Glucose-Capillary: 148 mg/dL — ABNORMAL HIGH (ref 70–99)
Glucose-Capillary: 157 mg/dL — ABNORMAL HIGH (ref 70–99)
Glucose-Capillary: 184 mg/dL — ABNORMAL HIGH (ref 70–99)

## 2013-09-13 LAB — COMPREHENSIVE METABOLIC PANEL
ALBUMIN: 2.1 g/dL — AB (ref 3.5–5.2)
ALT: 38 U/L (ref 0–53)
AST: 19 U/L (ref 0–37)
Alkaline Phosphatase: 114 U/L (ref 39–117)
BUN: 21 mg/dL (ref 6–23)
CALCIUM: 7.7 mg/dL — AB (ref 8.4–10.5)
CHLORIDE: 102 meq/L (ref 96–112)
CO2: 25 mEq/L (ref 19–32)
Creatinine, Ser: 0.74 mg/dL (ref 0.50–1.35)
GFR calc Af Amer: >90 mL/min (ref >90)
GFR calc non Af Amer: >90 mL/min (ref >90)
Glucose, Bld: 156 mg/dL — ABNORMAL HIGH (ref 70–99)
Potassium: 3.6 mEq/L — ABNORMAL LOW (ref 3.7–5.3)
SODIUM: 136 meq/L — AB (ref 137–147)
Total Bilirubin: 0.4 mg/dL (ref 0.3–1.2)
Total Protein: 5 g/dL — ABNORMAL LOW (ref 6.0–8.3)

## 2013-09-13 LAB — MAGNESIUM: Magnesium: 1.7 mg/dL (ref 1.5–2.5)

## 2013-09-13 LAB — HEMOGLOBIN AND HEMATOCRIT, BLOOD
HCT: 26.1 % — ABNORMAL LOW (ref 39.0–52.0)
HCT: 26.3 % — ABNORMAL LOW (ref 39.0–52.0)
HEMATOCRIT: 26.3 % — AB (ref 39.0–52.0)
HEMOGLOBIN: 8.8 g/dL — AB (ref 13.0–17.0)
HEMOGLOBIN: 8.1 g/dL — AB (ref 13.0–17.0)
Hemoglobin: 7.8 g/dL — ABNORMAL LOW (ref 13.0–17.0)

## 2013-09-13 LAB — PHOSPHORUS: PHOSPHORUS: 3.7 mg/dL (ref 2.3–4.6)

## 2013-09-13 LAB — PREPARE RBC (CROSSMATCH)

## 2013-09-13 MED ORDER — SODIUM CHLORIDE 0.9 % IV SOLN
8.0000 mg/h | INTRAVENOUS | Status: DC
  Administered 2013-09-13 – 2013-09-17 (×7): 8 mg/h via INTRAVENOUS
  Filled 2013-09-13 (×18): qty 80

## 2013-09-13 MED ORDER — DIAZEPAM 5 MG/ML IJ SOLN
5.0000 mg | INTRAMUSCULAR | Status: DC | PRN
  Administered 2013-09-15 – 2013-09-17 (×4): 5 mg via INTRAVENOUS
  Filled 2013-09-13 (×4): qty 2

## 2013-09-13 MED ORDER — HYDROMORPHONE BOLUS VIA INFUSION
1.0000 mg | INTRAVENOUS | Status: DC | PRN
  Administered 2013-09-14 – 2013-09-18 (×20): 1 mg via INTRAVENOUS
  Filled 2013-09-13: qty 1

## 2013-09-13 MED ORDER — DIAZEPAM 5 MG/ML IJ SOLN
5.0000 mg | Freq: Once | INTRAMUSCULAR | Status: AC
  Administered 2013-09-13: 5 mg via INTRAVENOUS
  Filled 2013-09-13: qty 2

## 2013-09-13 MED ORDER — FUROSEMIDE 10 MG/ML IJ SOLN
20.0000 mg | Freq: Once | INTRAMUSCULAR | Status: AC
  Administered 2013-09-13: 20 mg via INTRAVENOUS

## 2013-09-13 MED ORDER — PHENOL 1.4 % MT LIQD
1.0000 | OROMUCOSAL | Status: DC | PRN
  Filled 2013-09-13: qty 177

## 2013-09-13 MED ORDER — POTASSIUM CHLORIDE 10 MEQ/100ML IV SOLN
10.0000 meq | INTRAVENOUS | Status: AC
  Administered 2013-09-13 (×2): 10 meq via INTRAVENOUS
  Filled 2013-09-13 (×2): qty 100

## 2013-09-13 MED ORDER — DEXAMETHASONE SODIUM PHOSPHATE 4 MG/ML IJ SOLN
4.0000 mg | Freq: Two times a day (BID) | INTRAMUSCULAR | Status: DC
  Administered 2013-09-13 – 2013-09-17 (×9): 4 mg via INTRAVENOUS
  Filled 2013-09-13 (×11): qty 1

## 2013-09-13 MED ORDER — SCOPOLAMINE 1 MG/3DAYS TD PT72
1.0000 | MEDICATED_PATCH | TRANSDERMAL | Status: DC
  Administered 2013-09-13 – 2013-09-16 (×2): 1.5 mg via TRANSDERMAL
  Filled 2013-09-13 (×3): qty 1

## 2013-09-13 MED ORDER — TRACE MINERALS CR-CU-F-FE-I-MN-MO-SE-ZN IV SOLN
INTRAVENOUS | Status: AC
  Administered 2013-09-13: 17:00:00 via INTRAVENOUS
  Filled 2013-09-13: qty 3000

## 2013-09-13 MED ORDER — FENTANYL CITRATE 0.05 MG/ML IJ SOLN
75.0000 ug | INTRAMUSCULAR | Status: DC | PRN
  Administered 2013-09-13: 100 ug via INTRAVENOUS
  Filled 2013-09-13: qty 2

## 2013-09-13 MED ORDER — FUROSEMIDE 10 MG/ML IJ SOLN
INTRAMUSCULAR | Status: AC
  Filled 2013-09-13: qty 2

## 2013-09-13 MED ORDER — SODIUM CHLORIDE 0.9 % IV SOLN
1.0000 mg/h | INTRAVENOUS | Status: DC
  Administered 2013-09-13: 2 mg/h via INTRAVENOUS
  Administered 2013-09-13: 1 mg/h via INTRAVENOUS
  Administered 2013-09-14 (×3): 4 mg/h via INTRAVENOUS
  Administered 2013-09-14: 3 mg/h via INTRAVENOUS
  Filled 2013-09-13 (×5): qty 2.5

## 2013-09-13 MED ORDER — SODIUM CHLORIDE 0.9 % IV SOLN
50.0000 ug/h | INTRAVENOUS | Status: DC
  Administered 2013-09-13 – 2013-09-18 (×11): 50 ug/h via INTRAVENOUS
  Filled 2013-09-13 (×30): qty 1

## 2013-09-13 MED ORDER — FAT EMULSION 20 % IV EMUL
240.0000 mL | INTRAVENOUS | Status: AC
  Administered 2013-09-13: 240 mL via INTRAVENOUS
  Filled 2013-09-13: qty 250

## 2013-09-13 NOTE — Consult Note (Signed)
Palliative Medicine Team  Consult Note  58 yo man with metastatic rectal cancer. Admitted with intractable NV and UGIB.  Called re: Need for urgent consult by ICU RN and Dr. Doyle Askew. Patient vomiting copious amounts of stool with CT indicating probable malignant obstruction at the level of the duodenum with progressive bulky adenopathy and tumor burden within the abdomen. He has IMA vascular obstruction and probable early mesenteric ischemia. His prognosis is overwhelmingly poor given these findings. When I arrived at the bedside he was vomiting copious amounts of brown stool-filled several basins. There was no frank blood to suggest hemorraghic invasion of tumor into the GI tract. Vomit was dark brown liquid suggestive of stool, possibly mixed with old blood. He has blood transfusion ordered but is extremely edematous and volume overloaded receiving lasix. He is not hypotensive to suggest rapid hemorrage and his last Hb was 8.1 at 10AM and has bee 8.8, 8.4 in the preceding 8 hour increments.   I met with patient, his two sisters and his dad to discuss goals of care- it was difficulty to have a conversation with [place] because of his profuse vomiting of stool -he had been refusing NG tube-he appears confused and unable to tell me why he doesn't want an NG tube for decompression- I offered sedation but he could not really participate in his own goals of care. He is high risk for aspiration without NG and  I met with his family outside of the room by patient's request-he told me he wanted limited information or discussion. His family understand the serious nature of his condition and I discussed the CT findings with them in detail and these were also discussed with them with his oncologist from [facility].   Summary of Goals and Recommendations:   DNR  Treat reversible conditions with a focus on comfort and aggressive symptom management along with standard of care- once clearly diagnosed with irreversible  malignant obstruction will transition to comfort care only.  Symptom Management:   He has agreed to the NG tube if give sedation- now placed  Discontinue PCA orders-he need a continuous infusion of Dilaudid and isn't coherent enough to push a button-will have nursing titrate for his comfort  Valium 65m PRN for agitation and sedation while NG tube is in- this causes him significant distress.  Plan is for EGD in AM-should try to keep him sedated until then for his comfort and the necessity of an NG-once placed it put out 2+ Liters of feculent material.  I have started an Octreotide infusion at 50/hr- he needs continuous infusion given short half life and malignant obstruction  I have continued Protonix infusion at 80  He has blood ordered but is very overloaded- spoke with Dr. SPercell Locus<8 will transfuse.  Care Planning:  I had a frank and open conversation about prognosis and the need for hospice care and prepared them for the possibility that he may not survive this hospitalization.  Will follow [place] closely.  Time: 1:45PM-3PM -70 minutes total. Greater than 50%  of this time was spent counseling and coordinating care related to the above assessment and plan.  ELane Hacker DO Palliative Medicine

## 2013-09-13 NOTE — Progress Notes (Signed)
PARENTERAL NUTRITION CONSULT NOTE - Follow Up  Pharmacy Consult for TPN Indication: Short bowel d/t rectal cancer  Allergies  Allergen Reactions  . Codeine Nausea And Vomiting  . Effexor [Venlafaxine Hcl] Other (See Comments)    Flat Feeling  . Imuran [Azathioprine] Nausea And Vomiting    Patient Measurements: Height: 5\' 9"  (175.3 cm) Weight: 157 lb 13.6 oz (71.6 kg) IBW/kg (Calculated) : 70.7  Vital Signs: Temp: 99.2 F (37.3 C) (06/04 0400) Temp src: Oral (06/04 0400) BP: 147/87 mmHg (06/04 0400) Pulse Rate: 130 (06/04 0400) Intake/Output from previous day: 06/03 0701 - 06/04 0700 In: 4279.5 [I.V.:929.5; IV Piggyback:1150; TPN:2200] Out: 0630 [Urine:725; Emesis/NG output:800; Stool:250] Intake/Output from this shift:    Labs:  Recent Labs  09/11/13 1759 09/12/13 0430  09/12/13 1725 09/13/13 0150 09/13/13 0355  WBC 11.1* 11.4*  --   --   --  8.1  HGB 9.5* 9.2*  < > 9.1* 8.8* 8.4*  HCT 28.3* 27.4*  < > 27.2* 26.3* 25.4*  PLT 176 173  --   --   --  186  < > = values in this interval not displayed.   Recent Labs  09/11/13 0515 09/12/13 0430 09/13/13 0355  NA 134* 132* 136*  K 4.1 3.7 3.6*  CL 98 98 102  CO2 27 24 25   GLUCOSE 176* 145* 156*  BUN 24* 21 21  CREATININE 0.75 0.75 0.74  CALCIUM 7.7* 7.5* 7.7*  MG 1.7  --  1.7  PHOS 3.6  --  3.7  PROT  --   --  5.0*  ALBUMIN  --   --  2.1*  AST  --   --  19  ALT  --   --  38  ALKPHOS  --   --  114  BILITOT  --   --  0.4   Estimated Creatinine Clearance: 101.9 ml/min (by C-G formula based on Cr of 0.74).    Recent Labs  09/12/13 2025 09/12/13 2344 09/13/13 0335  GLUCAP 119* 157* 148*    Insulin Requirements in the past 24 hours:  16 units SSI  Current Nutrition:  NPO Clinimix E 5/15 at a goal rate of 22ml/hr + 20% fat emulsion at 39ml/hr continuous infusions  IVF: NS at 20 ml/hr  Assessment: [name] w/ metastatic rectal cancer on TPN at home, admitted w/ syncope and vomiting blood. Also  on an ambulatory Dilaudid PCA and getting IV chemotherapy at home via [facility]. Managed by MDs at [facility] was to tx to [facility] when stabilized; however, pt continued to vomit significant amounts of blood and bleed from colostomy site which is being followed and treated by GI.  Recently was considering hospice. Pharmacy is asked to dose TPN while inpatient.    Glucose - CBGs improved since adjusting SSI to resistant scale q4h and changing IVF from D5-NS to NS.  Electrolytes - Mag/Phos WNL but K 3.6.  Na 136, low but improved.  Other lytes ok.  Renal - SCr WNL  LFTs - now WNL  TGs - 163 (6/1)  Prealbumin - 23.1 (6/1)  Nutritional Goals:   RD's Estimated Nutritional Needs (6/1): Kcal: 2300-2500, Protein: 107-120 grams, Fluid: >/=2.3 L/day   TPN PTA: Cyclic infusion (1S-0F), 2545ml delivered. Bag includes lipids one day a week. Daily avg: 1824Kcal and 96g protein. Standard MVI/TE. No insulin. Octreotide 349mcg/bag.  RD's recommendations this admission are higher than what patient is receiving in home TNA.  Will adjust inpatient TNA to meet RD's goals.  Clinimix E 5/20 at a goal rate of 9ml/hr + 20% fat emulsion at 45ml/hr to provide: 108g/day protein, 2380Kcal/day.  Providing over continuous infusion over 24 hours for now (as opposed to cycling over 12 hours) due to acute illness and increased nutrition goals.  Will provide octreotide 100 mcg IV TID as a separate order to replace octreotide administered through home TNA.  TPN Access: PICC TPN day#: 5 (inpatient), has been on TPN for 9 months per patient report  Plan: At 1800:  Continue Clinimix E 5/20 at 90 ml/hr.    20% fat emulsion at 27ml/hr.  TNA to contain standard multivitamins and trace elements.  IVF reduced to 20 ml/hr to account for volume provided by TNA.   Continue SSI resistant scale q4h.   KCl 10 mEq IV x 2.  TNA lab panels on Mondays & Thursdays.  BMET in AM.  F/u daily.  Hershal Coria, PharmD, BCPS Pager:  [phone] 09/13/2013 8:17 AM

## 2013-09-13 NOTE — Progress Notes (Signed)
[facility] Progress Note  Subjective: Patient very somnolent from sedatives refused NG tube last night, minimal abdominal pain currently denying nausea  Objective: Vital signs in last 24 hours: Temp:  [98.6 F (37 C)-99.2 F (37.3 C)] 98.6 F (37 C) (06/04 0800) Pulse Rate:  [123-145] 127 (06/04 1000) Resp:  [12-24] 18 (06/04 1000) BP: (125-158)/(75-92) 144/90 mmHg (06/04 1000) SpO2:  [98 %-100 %] 100 % (06/04 1000) Weight:  [76.204 kg (168 lb)] 76.204 kg (168 lb) (06/04 0900) Weight change:    PE: Somnolent lethargic abdomen soft, some old blood in the ostomy bag.  Lab Results: Results for orders placed during the hospital encounter of 09/09/13 (from the past 24 hour(s))  GLUCOSE, CAPILLARY     Status: Abnormal   Collection Time    09/12/13 12:31 PM      Result Value Ref Range   Glucose-Capillary 129 (*) 70 - 99 mg/dL   Comment 1 Documented in Chart     Comment 2 Notify RN    TROPONIN I     Status: None   Collection Time    09/12/13  5:25 PM      Result Value Ref Range   Troponin I <0.30  <0.30 ng/mL  HEMOGLOBIN AND HEMATOCRIT, BLOOD     Status: Abnormal   Collection Time    09/12/13  5:25 PM      Result Value Ref Range   Hemoglobin 9.1 (*) 13.0 - 17.0 g/dL   HCT 27.2 (*) 39.0 - 52.0 %  GLUCOSE, CAPILLARY     Status: Abnormal   Collection Time    09/12/13  5:34 PM      Result Value Ref Range   Glucose-Capillary 130 (*) 70 - 99 mg/dL   Comment 1 Documented in Chart     Comment 2 Notify RN    GLUCOSE, CAPILLARY     Status: Abnormal   Collection Time    09/12/13  8:25 PM      Result Value Ref Range   Glucose-Capillary 119 (*) 70 - 99 mg/dL   Comment 1 Notify RN    TROPONIN I     Status: None   Collection Time    09/12/13 10:51 PM      Result Value Ref Range   Troponin I <0.30  <0.30 ng/mL  GLUCOSE, CAPILLARY     Status: Abnormal   Collection Time    09/12/13 11:44 PM      Result Value Ref Range   Glucose-Capillary 157 (*) 70 - 99 mg/dL    Comment 1 Documented in Chart     Comment 2 Notify RN    HEMOGLOBIN AND HEMATOCRIT, BLOOD     Status: Abnormal   Collection Time    09/13/13  1:50 AM      Result Value Ref Range   Hemoglobin 8.8 (*) 13.0 - 17.0 g/dL   HCT 26.3 (*) 39.0 - 52.0 %  GLUCOSE, CAPILLARY     Status: Abnormal   Collection Time    09/13/13  3:35 AM      Result Value Ref Range   Glucose-Capillary 148 (*) 70 - 99 mg/dL   Comment 1 Notify RN    COMPREHENSIVE METABOLIC PANEL     Status: Abnormal   Collection Time    09/13/13  3:55 AM      Result Value Ref Range   Sodium 136 (*) 137 - 147 mEq/L   Potassium 3.6 (*) 3.7 - 5.3 mEq/L   Chloride 102  96 - 112 mEq/L   CO2 25  19 - 32 mEq/L   Glucose, Bld 156 (*) 70 - 99 mg/dL   BUN 21  6 - 23 mg/dL   Creatinine, Ser 0.74  0.50 - 1.35 mg/dL   Calcium 7.7 (*) 8.4 - 10.5 mg/dL   Total Protein 5.0 (*) 6.0 - 8.3 g/dL   Albumin 2.1 (*) 3.5 - 5.2 g/dL   AST 19  0 - 37 U/L   ALT 38  0 - 53 U/L   Alkaline Phosphatase 114  39 - 117 U/L   Total Bilirubin 0.4  0.3 - 1.2 mg/dL   GFR calc non Af Amer >90  >90 mL/min   GFR calc Af Amer >90  >90 mL/min  CBC WITH DIFFERENTIAL     Status: Abnormal   Collection Time    09/13/13  3:55 AM      Result Value Ref Range   WBC 8.1  4.0 - 10.5 K/uL   RBC 2.76 (*) 4.22 - 5.81 MIL/uL   Hemoglobin 8.4 (*) 13.0 - 17.0 g/dL   HCT 25.4 (*) 39.0 - 52.0 %   MCV 92.0  78.0 - 100.0 fL   MCH 30.4  26.0 - 34.0 pg   MCHC 33.1  30.0 - 36.0 g/dL   RDW 17.2 (*) 11.5 - 15.5 %   Platelets 186  150 - 400 K/uL   Neutrophils Relative % 81 (*) 43 - 77 %   Neutro Abs 6.5  1.7 - 7.7 K/uL   Lymphocytes Relative 8 (*) 12 - 46 %   Lymphs Abs 0.7  0.7 - 4.0 K/uL   Monocytes Relative 10  3 - 12 %   Monocytes Absolute 0.8  0.1 - 1.0 K/uL   Eosinophils Relative 1  0 - 5 %   Eosinophils Absolute 0.1  0.0 - 0.7 K/uL   Basophils Relative 0  0 - 1 %   Basophils Absolute 0.0  0.0 - 0.1 K/uL  MAGNESIUM     Status: None   Collection Time    09/13/13  3:55 AM       Result Value Ref Range   Magnesium 1.7  1.5 - 2.5 mg/dL  PHOSPHORUS     Status: None   Collection Time    09/13/13  3:55 AM      Result Value Ref Range   Phosphorus 3.7  2.3 - 4.6 mg/dL  HEMOGLOBIN AND HEMATOCRIT, BLOOD     Status: Abnormal   Collection Time    09/13/13 10:28 AM      Result Value Ref Range   Hemoglobin 8.1 (*) 13.0 - 17.0 g/dL   HCT 26.1 (*) 39.0 - 52.0 %    Studies/Results: Nm Gi Blood Loss  09/12/2013   ADDENDUM REPORT: 09/12/2013 17:26  ADDENDUM: The case was re- reviewed in conjunction with CT of the abdomen and pelvis which of was obtained subsequent to this exam. The activity which was previously felt well likely represent radiotracer accumulation within the patient's left ostomy is more likely to represent pooling of radiotracer activity within the patient's obstructed stomach. It is difficult to say, with a high degree of certainty the whether or not the gastric activity is free pertechnetate or extravasated radiotracer from an active gastric bleed GI bleed. However, given the lack of significant increase in intensity over time this is favored to represent a likely free pertechnetate.   Electronically Signed   By: Kerby Moors M.D.   On: 09/12/2013  17:26   09/12/2013   CLINICAL DATA:  GI bleeding  EXAM: NUCLEAR MEDICINE GASTROINTESTINAL BLEEDING SCAN  TECHNIQUE: Sequential abdominal images were obtained following intravenous administration of Tc-22m labeled red blood cells.  RADIOPHARMACEUTICALS:  26.7 mCi Tc-50m in-vitro labeled red cells.  COMPARISON:  CTA 09/12/2013  FINDINGS: Dynamically acquired sequential images of the abdomen and pelvis show a radiotracer accumulation within patient's left sided ostomy bag. The radiotracer is felt a likely originate from a proximal/ upper GI source, presumably distal stomach or proximal duodenum. Physiologic tracer activity is seen within the liver, spleen and urinary bladder as well as blood pool.  IMPRESSION: 1. Examination  is positive for active GI bleeding with radiotracer accumulation in the patient's left abdominal ostomy. Source of bleeding is likely upper GI tract, either distal stomach or proximal duodenum.  Electronically Signed: By: Kerby Moors M.D. On: 09/12/2013 17:10   Ct Angio Abd/pel W/ And/or W/o  09/12/2013   CLINICAL DATA:  History of metastatic colon cancer (2009), post chemotherapy, post ileostomy now with nausea and vomiting and duodenal ulcer. Evaluate for source of bleed given undefined anatomy post ostomy revision.  EXAM: CT ANGIOGRAPHY ABDOMEN AND PELVIS WITH CONTRAST AND WITHOUT CONTRAST  TECHNIQUE: Multidetector CT imaging of the abdomen and pelvis was performed using the standard protocol during bolus administration of intravenous contrast. Multiplanar reconstructed images including MIPs were obtained and reviewed to evaluate the vascular anatomy.  CONTRAST:  161mL OMNIPAQUE IOHEXOL 350 MG/ML SOLN  COMPARISON:  CT of the abdomen pelvis - 09/12/2012  FINDINGS: Vascular Findings:  Abdominal aorta: Normal caliber of the abdominal aorta. No abdominal aortic dissection. Ill-defined sclerosing soft tissue within the lower abdomen/ upper pelvis abuts the anterior wall of the abdominal aorta though does not result in a hemodynamically significant stenosis.  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 While the cranial aspect of the retroperitoneal fibrosis abuts the caudal surface of the celiac artery, it does not result in a hemodynamically significant stenosis or vessel narrowing. Conventional branching pattern. There is mild hypertrophy of multiple pancreaticoduodenal arterial collaterals, providing additional arterial supply to the SMA.  SMA: The retroperitoneal fibrosis circumferentially surrounds and narrows the SMA throughout its course (image 62, series 5), resulting in a long segment at least moderate narrowing,  placing the majority of the small bowel at risk for ischemia.  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 The dominant right-sided renal artery is widely patent without hemodynamically significant narrowing.  Left Renal artery: Solitary; widely patent without hemodynamically significant narrowing.  IMA: Appears occluded shortly after its origin with reconstitution from collateral supply from the SMA.  Pelvic vasculature: The bilateral common, external and internal iliac arteries are of normal caliber and widely patent without hemodynamically significant stenosis.  Review of the MIP images confirms the above findings.   --------------------------------------------------------------------------------  Nonvascular Findings:  There is an ill defined confluent slightly nodular soft tissue mass within the retroperitoneum which is noted to inwardly retract and partially obstruct the bilateral ureters (image 97, series 2 as well as markedly narrow the horizontal segment of the duodenum (image 80, series 2) and circumflex early in case and mildly diffusely narrow the SMA (represented image 74, series 2). These findings have all progressed since prior examination performed 10/02/2012 and worrisome for progression of retroperitoneal fibrosis.  Additionally, there has been progression of bulky pathologically enlarged mesenteric lymph  nodes which have also been retracted posteriorly with index mesenteric lymph node measuring approximately 1.4 cm in greatest short axis diameter (image 97, series 2, previously, 1.2 cm in diameter. This finding is worrisome for progression of provided history of metastatic rectal cancer.  Post total colectomy. There is an apparent left lower quadrant end ileostomy as well as an apparent right lower quadrant mucous fistula. The patient has undergone an apparent low anterior resection with ill-defined residual soft  tissue within the lower pelvis with associated minimal amount of adjacent fluid within the lower pelvis, presumably sequela radiation change (image 132, series 2).  There is marked narrowing of the horizontal segment of the duodenum (image 80, series 2), secondary to the cranial aspect of the ill-defined retractile retroperitoneal mass. This finding is associated with marked air and fluid distension of the stomach and proximal duodenum. No pneumoperitoneum, pneumatosis or portal venous gas.  Normal hepatic contour. There is mild diffuse decreased attenuation of the hepatic parenchyma on this postcontrast examination suggestive of hepatic steatosis. No discrete hepatic lesions Post cholecystectomy. No intra or extra hepatic biliary duct dilatation. No ascites.  There is symmetric enhancement of the bilateral kidneys. Interval development of moderate bilateral pelvicaliectasis and ureterectasis to the level of the ill-defined soft tissue mass within the posterior aspect of the retroperitoneum (representative axial image 97, series 2). There is delayed excretion from the bilateral kidneys. There is a minimal amount of bilateral perinephric stranding. Note is made of a subcentimeter (approximately 0.8 cm) hypoattenuating lesion within the posterior aspect of the inferior pole of the left Kidney (79, series 4) which is too small to accurately characterize of favored to represent a renal cyst. No definite renal stones on this postcontrast examination.  Mild distention of the urinary bladder. No asymmetric urinary bladder wall thickening. Scattered minimal calcifications within normal size prostate gland.  Normal appearance of the bilateral adrenal glands and spleen. The pancreas is markedly atrophic.  Limited visualization of the lower thorax demonstrates minimal subsegmental atelectasis within the bilateral lower lobes and image right middle lobe. No pleural effusion.  Normal heart size. The tip of a central venous  catheter terminates within the superior cavoatrial junction. No pericardial effusion.  No acute or aggressive osseous abnormalities. There is straightening expected lumbar lordosis. Mild multilevel lumbar spine DDD, worse at L4-L5 and L5-S1 with disc space height loss, endplate irregularity and sclerosis.  There is a well-healed midline abdominal incision without evidence of hernia formation. There is diffuse body wall edema, most conspicuous about the bilateral thighs and buttocks.  IMPRESSION: 1. Interval worsening of ill-defined serpiginous retractile retroperitoneal soft mass which partially obstructs the bilateral ureters (now resulting moderate bilateral pelvicaliectasis and ureterectasis) and marked narrowing of the duodenum (resulting in at least partial functional obstruction with upstream dilatation of the proximal duodenum and stomach). 2. 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 3. The celiac artery is retracted caudally secondary to the retroperitoneal fibrosis, though remains widely patent without hemodynamically significant narrowing. The celiac artery appears to be the dominant arterial supply to the intestines with mild hypertrophy of several pancreaticoduodenal arteries providing collateral supply to the SMA distribution. No evidence of pneumatosis or portal venous gas. 4. Post LAR with ill-defined soft tissue  and fluid with the lower pelvis, presumably sequela of prior radiation change though locally recurrent disease is not excluded on the basis of this examination. Worsening retroperitoneal adenopathy worrisome for progression of provided history of rectal cancer. Correlation to prior  outside examinations is recommended. 5. 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 No definable/drainable fluid collection within the abdomen or pelvis. 6. Suspected hepatic steatosis.  Above findings were discussed by Dr. Markus Daft (interventional radiology) with Dr. Teena Irani at 17:38.   Electronically Signed   By: Sandi Mariscal M.D.   On: 09/12/2013 18:13      Assessment: 1. Metastatic rectal carcinoma with presumed progression based on yesterday CT scan 2. Possible duodenal invasion with tumor versus duodenal ulcer as per initial endoscopic impression likely responsible for GI bleeding 3. 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 GI bleeding  Plan: 1. Very difficult management situation. If he is developing a gastric outlet obstruction I doubt he would be candidate for anymore surgical bypass. It may or may not be possible to place a duodenal endoscopic stent but would be difficult and risky if concomitant bleeding duodenal ulcer present. 2. Given somewhat poor visibility on first EGD, we'll repeat the study tomorrow morning after 3 or 4 days of PPI therapy to see if any improvement in his apparent gastric outlet obstruction and to better visualize and understand whether processin  duodenum more likely an NSAID-induced ulcer or penetrating malignancy. 3. May need to consider palliative care consult in the near future because of disease progression and if unable to alleviate his gastric outlet obstruction.    Missy Sabins 09/13/2013, 12:14 PM

## 2013-09-13 NOTE — Progress Notes (Signed)
Patient ID: Brandon EMMEL, male   DOB: 1955-11-12, 58 y.o.   MRN: [id number]  [facility] PROGRESS NOTE  JOSEPHANTHONY JOKELA SEG:[id number] DOB: [birth year] DOA: 09/09/2013 PCP: Pearla Dubonnet, MD  Brief narrative: 58 yo male with metastatic rectal cancer who is status post cycle 14 of 5-FU with last chemotherapy administered reportedly 1 week prior to admission. Presented with main concern of near syncope, hematemesis, and blood in ostomy bag. GI evaluated a patient with endoscopy and found duodenal ulcer most likely NSAID induced.   Active Problems: Metastatic rectal carcinoma  Progressive based on CT scan  Pt requiring multiple analgesia for adequate pain control  Discussed with father at bedside of significance of current diagnostic studies and he is in agreement he wasn't to ensure comfort for his son  We appreciate Gi input  I will consult with palliative care team as well to help [place] with symptom management and to address GOC  Acute GI bleed  Possible duodenal invasion with tumor vs duodenal [place] as per initial EGD  One episode of coffee ground emesis this AM  Plan for another EGD in AM  Difficult to transfuse blood as pt is rather volume overloaded, I have explained this to pt's father and he has verbalized understanding  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  On TPN but this is also becoming difficult to manage in the setting of volume overload and very poor clinical response, progressive deterioration, electrolyte disturbance   IV access is also now becoming an issue in the setting of volume overload, we will try to supplement K via IV route   Pt is very poor surgical candidate in the setting of progressive and invasive carcinoma   Palliative care team consulted as noted above Anasarca  Secondary to fluids pt's is receiving, TP, transfusions, NS  Weight on admission 157 lbs and this AM 168 lbs  Will give one dose of lasix  20 mg IV, plan for more transfusion today  Again, this was addressed with pt's father as currently treatment options are very limited and consideration of palliative care is strongly encouraged  Somnolence  From all the sedating medications pt is receiving: 3 fentanyl patches, in addition to inj, Dilaudid PCA, neurontin, benadryl, valium, percocet   My understanding is that pt is not getting and PO medications but is still with 3 fent patches and dilaudid PCA  Will assist with PCT to held manage symptoms  Severe malnutrition  Secondary to progressive nature of the malignancy  On TPN Functional quadriplegia   Secondary to acute on chronic progressive illness, pt has been bed bound since admission and is currently unable to even sit up in the bed   This is further contributing to overall poor clinical progression  Tachycardia  LE doppler negative for DVT DVT prophylaxis  Pt refusing SCD's, can not give heparin or lovenox due to high risk bleed   Consultants:  GI   Palliative care team   Procedures/Studies: Nm Gi Blood Loss  09/12/2013   ADDENDUM REPORT: The case was re- reviewed in conjunction with CT of the abdomen and pelvis which of was obtained subsequent to this exam. The activity which was previously felt well likely represent radiotracer accumulation within the patient's left ostomy is more likely to represent pooling of radiotracer activity within the patient's obstructed stomach. It is difficult to say, with a high degree of certainty the whether or not the gastric activity is free pertechnetate or extravasated  radiotracer from an active gastric bleed GI bleed. However, given the lack of significant increase in intensity over time this is favored to represent a likely free pertechnetate.    09/12/2013  Examination is positive for active GI bleeding with radiotracer accumulation in the patient's left abdominal ostomy. Source of bleeding is likely upper GI tract, either distal  stomach or proximal duodenum.  Ct Angio Abd/pel W/ And/or W/o  09/12/2013    1. Interval worsening of ill-defined serpiginous retractile retroperitoneal soft mass which partially obstructs the bilateral ureters (now resulting moderate bilateral pelvicaliectasis and ureterectasis) and marked narrowing of the duodenum (resulting in at least partial functional obstruction with upstream dilatation of the proximal duodenum and stomach).  2. 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  3. The celiac artery is retracted caudally secondary to the retroperitoneal fibrosis, though remains widely patent without hemodynamically significant narrowing. The celiac artery appears to be the dominant arterial supply to the intestines with mild hypertrophy of several pancreaticoduodenal arteries providing collateral supply to the SMA distribution. No evidence of pneumatosis or portal venous gas.  4. Post LAR with ill-defined soft tissue and fluid with the lower pelvis, presumably sequela of prior radiation change though locally recurrent disease is not excluded on the basis of this examination. Worsening retroperitoneal adenopathy worrisome for progression of provided history of rectal cancer. Correlation to prior outside examinations is recommended.  5. 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 No definable/drainable fluid collection within the abdomen or pelvis. 6. Suspected hepatic steatosis.  A Antibiotics:  None   Code Status: DNR Family Communication: Father at bedside Disposition Plan:  Remains inpatient   HPI/Subjective: Persistent coffee ground emesis. More somnolent over the past 24 hours.   Objective: Filed Vitals:   09/13/13 0234 09/13/13 0357 09/13/13 0400 09/13/13 0800  BP: 141/87  147/87   Pulse: 145  130   Temp:   99.2 F (37.3 C) 98.6 F (37 C)  TempSrc:   Oral Oral  Resp: 24 19 17    Height:      Weight:      SpO2: 100% 100% 100%     Intake/Output Summary (Last 24 hours) at 09/13/13 0840 Last data filed at 09/13/13 0600  Gross per 24 hour  Intake 4189.5 ml  Output   1675 ml  Net 2514.5 ml    Exam:   General:  Pt is somnolent but able to follow some commands, frail   Cardiovascular: Regular rhythm, tachycardic, no rubs, no gallops  Respiratory: Poor inspiratory effort, bibasilar crackles    Abdomen: Soft, non tender, non distended, bowel sounds present, no guarding  Extremities: +2 bilateral LE pitting edema, R>L   Data Reviewed: Basic Metabolic Panel:  Recent Labs Lab 09/09/13 0845 09/10/13 0550 09/11/13 0515 09/12/13 0430 09/13/13 0355  NA 135* 137 134* 132* 136*  K 4.6 4.3 4.1 3.7 3.6*  CL 101 100 98 98 102  CO2 24 28 27 24 25   GLUCOSE 174* 146* 176* 145* 156*  BUN 37* 30* 24* 21 21  CREATININE 0.81 0.70 0.75 0.75 0.74  CALCIUM 7.8* 7.7* 7.7* 7.5* 7.7*  MG  --  1.8 1.7  --  1.7  PHOS  --  3.3 3.6  --  3.7   Liver Function Tests:  Recent Labs Lab 09/09/13 0845 09/10/13 0550 09/13/13 0355  AST 53* 15 19  ALT 88* 60* 38  ALKPHOS 135* 113 114  BILITOT 0.5 0.3 0.4  PROT 5.0* 5.1* 5.0*  ALBUMIN 2.2* 2.2* 2.1*   CBC:  Recent Labs Lab 09/09/13 0845 09/10/13 0550 09/10/13 2115 09/11/13 0150 09/11/13 1759 09/12/13 0430 09/12/13 1101 09/12/13 1725 09/13/13 0150 09/13/13 0355  WBC 34.7* 14.0* 15.0* 12.9* 11.1* 11.4*  --   --   --  8.1  NEUTROABS 29.8* 10.9*  --   --  8.7*  --   --   --   --  6.5  HGB 7.3* 9.2* 7.9* 7.6* 9.5* 9.2* 9.4* 9.1* 8.8* 8.4*  HCT 22.2* 28.2* 23.0* 22.7* 28.3* 27.4* 28.3* 27.2* 26.3*  25.4*  MCV 101.8* 93.4 92.7 94.2 89.8 90.7  --   --   --  92.0  PLT 325 232 220 194 176 173  --   --   --  186  < > = values in this interval not displayed. Cardiac Enzymes:  Recent Labs Lab 09/12/13 1101 09/12/13 1725 09/12/13 2251  TROPONINI <0.30 <0.30 <0.30   BNP: No components found with this basename: POCBNP,  CBG:  Recent Labs Lab 09/12/13 1231 09/12/13 1734 09/12/13 2025 09/12/13 2344 09/13/13 0335  GLUCAP 129* 130* 119* 157* 148*    Recent Results (from the past 240 hour(s))  CULTURE, BLOOD (ROUTINE X 2)     Status: None   Collection Time    09/09/13  8:45 AM      Result Value Ref Range Status   Specimen Description BLOOD PICC LINE   Final   Special Requests BOTTLES DRAWN AEROBIC AND ANAEROBIC 5CC EACHA   Final   Culture  Setup Time     Final   Value: 09/09/2013 14:17     Performed at [facility]   Culture     Final   Value:        BLOOD CULTURE RECEIVED NO GROWTH TO DATE CULTURE WILL BE HELD FOR 5 DAYS BEFORE ISSUING A FINAL NEGATIVE REPORT     Performed at [facility]   Report Status PENDING   Incomplete  CULTURE, BLOOD (ROUTINE X 2)     Status: None   Collection Time    09/09/13  9:23 AM      Result Value Ref Range Status   Specimen Description BLOOD RIGHT HAND   Final   Special Requests BOTTLES DRAWN AEROBIC AND ANAEROBIC 5CC EACH   Final   Culture  Setup Time     Final   Value: 09/09/2013 14:16     Performed at [facility]   Culture     Final   Value:        BLOOD CULTURE RECEIVED NO GROWTH TO DATE CULTURE WILL BE HELD FOR 5 DAYS BEFORE ISSUING A FINAL NEGATIVE REPORT     Performed at [facility]   Report Status PENDING   Incomplete  MRSA PCR SCREENING     Status: None   Collection Time    09/09/13 11:58 AM      Result Value Ref Range Status   MRSA by PCR NEGATIVE  NEGATIVE Final   Comment:            The GeneXpert MRSA Assay (FDA     approved for  NASAL specimens     only), is one component of a      comprehensive MRSA colonization     surveillance program. It is not     intended to diagnose MRSA     infection nor to guide or     monitor treatment for     MRSA infections.  URINE CULTURE     Status: None   Collection Time    09/10/13  3:20 AM      Result Value Ref Range Status   Specimen Description URINE, RANDOM   Final   Special Requests NONE   Final   Culture  Setup Time     Final   Value: 09/10/2013 09:26     Performed at [place]     Final   Value: 6,000 COLONIES/ML     Performed at [facility]   Culture     Final   Value: INSIGNIFICANT GROWTH     Performed at [facility]   Report Status 09/11/2013 FINAL   Final     Scheduled Meds: . antiseptic oral rinse  15 mL Mouth Rinse q12n4p  . cabergoline  0.25 mg Oral Once per day on Mon Thu  . chlorhexidine  15 mL Mouth Rinse BID  . dexamethasone  4 mg Oral Q breakfast  . fentaNYL  100 mcg Transdermal Q72H  . fentaNYL  100 mcg Transdermal Q72H  . fentaNYL  100 mcg Transdermal Q72H  . fentaNYL  100 mcg Transdermal Once  . fentaNYL  100 mcg Transdermal Once  . gabapentin  300 mg Oral TID  . HYDROmorphone PCA 0.3 mg/mL   Intravenous 6 times per day  . insulin aspart  0-20 Units Subcutaneous 6 times per day  . metoCLOPramide (REGLAN) injection  10 mg Intravenous 4 times per day  . octreotide  100 mcg Intravenous 3 times per day  . [START ON 09/14/2013] pantoprazole (PROTONIX) IV  40 mg Intravenous Q12H  . potassium chloride  10 mEq Intravenous Q1 Hr x 2  . tamsulosin  0.4 mg Oral QHS   Continuous Infusions: . sodium chloride 20 mL/hr (09/12/13 1901)  . Marland KitchenTPN (CLINIMIX-E) Adult 90 mL/hr at 09/12/13 1716   And  . fat emulsion 240 mL (09/12/13 1716)  . Marland KitchenTPN (CLINIMIX-E) Adult     And  . fat emulsion    . pantoprozole (PROTONIX) infusion 8 mg/hr (09/13/13 0538)     Theodis Blaze, MD  [facility] Pager [phone]  If 7PM-7AM, please contact night-coverage [email or web address] Password  [email or web address] 09/13/2013, 8:40 AM   LOS: 4 days

## 2013-09-13 NOTE — Progress Notes (Signed)
VASCULAR LAB PRELIMINARY  PRELIMINARY  PRELIMINARY  PRELIMINARY  Right lower extremity venous duplex completed.    Preliminary report:  Right:  No evidence of DVT, superficial thrombosis, or Baker's cyst.  [place], RVS 09/13/2013, 9:42 AM

## 2013-09-14 ENCOUNTER — Encounter (HOSPITAL_COMMUNITY)
Admission: EM | Disposition: A | Discharge status: Home Health | Payer: Self-pay | Source: Home / Self Care | Attending: Internal Medicine

## 2013-09-14 DIAGNOSIS — K260 Acute duodenal ulcer with hemorrhage: Secondary | ICD-10-CM

## 2013-09-14 DIAGNOSIS — Z515 Encounter for palliative care: Secondary | ICD-10-CM

## 2013-09-14 LAB — MAGNESIUM: Magnesium: 1.9 mg/dL (ref 1.5–2.5)

## 2013-09-14 LAB — GLUCOSE, CAPILLARY
GLUCOSE-CAPILLARY: 123 mg/dL — AB (ref 70–99)
GLUCOSE-CAPILLARY: 167 mg/dL — AB (ref 70–99)
GLUCOSE-CAPILLARY: 173 mg/dL — AB (ref 70–99)
Glucose-Capillary: 160 mg/dL — ABNORMAL HIGH (ref 70–99)
Glucose-Capillary: 154 mg/dL — ABNORMAL HIGH (ref 70–99)
Glucose-Capillary: 178 mg/dL — ABNORMAL HIGH (ref 70–99)
Glucose-Capillary: 125 mg/dL — ABNORMAL HIGH (ref 70–99)

## 2013-09-14 LAB — BASIC METABOLIC PANEL
BUN: 20 mg/dL (ref 6–23)
CHLORIDE: 98 meq/L (ref 96–112)
CO2: 24 mEq/L (ref 19–32)
Calcium: 8.1 mg/dL — ABNORMAL LOW (ref 8.4–10.5)
Creatinine, Ser: 0.73 mg/dL (ref 0.50–1.35)
Glucose, Bld: 204 mg/dL — ABNORMAL HIGH (ref 70–99)
POTASSIUM: 4.2 meq/L (ref 3.7–5.3)
SODIUM: 134 meq/L — AB (ref 137–147)

## 2013-09-14 LAB — CBC WITH DIFFERENTIAL/PLATELET
BASOS PCT: 0 % (ref 0–1)
Basophils Absolute: 0 10*3/uL (ref 0.0–0.1)
Eosinophils Absolute: 0 10*3/uL (ref 0.0–0.7)
Eosinophils Relative: 0 % (ref 0–5)
HEMATOCRIT: 28.4 % — AB (ref 39.0–52.0)
Hemoglobin: 9.7 g/dL — ABNORMAL LOW (ref 13.0–17.0)
Lymphocytes Relative: 4 % — ABNORMAL LOW (ref 12–46)
Lymphs Abs: 0.4 10*3/uL — ABNORMAL LOW (ref 0.7–4.0)
MCH: 30.1 pg (ref 26.0–34.0)
MCHC: 34.2 g/dL (ref 30.0–36.0)
MCV: 88.2 fL (ref 78.0–100.0)
MONO ABS: 0.4 10*3/uL (ref 0.1–1.0)
Monocytes Relative: 5 % (ref 3–12)
NEUTROS ABS: 7.9 10*3/uL — AB (ref 1.7–7.7)
Neutrophils Relative %: 90 % — ABNORMAL HIGH (ref 43–77)
Platelets: 217 10*3/uL (ref 150–400)
RBC: 3.22 MIL/uL — ABNORMAL LOW (ref 4.22–5.81)
RDW: 17.6 % — ABNORMAL HIGH (ref 11.5–15.5)
WBC: 8.7 10*3/uL (ref 4.0–10.5)

## 2013-09-14 LAB — PHOSPHORUS: Phosphorus: 3.9 mg/dL (ref 2.3–4.6)

## 2013-09-14 LAB — HEMOGLOBIN AND HEMATOCRIT, BLOOD
HCT: 29.3 % — ABNORMAL LOW (ref 39.0–52.0)
HEMATOCRIT: 27.8 % — AB (ref 39.0–52.0)
HEMOGLOBIN: 9.6 g/dL — AB (ref 13.0–17.0)
Hemoglobin: 9.4 g/dL — ABNORMAL LOW (ref 13.0–17.0)

## 2013-09-14 SURGERY — EGD (ESOPHAGOGASTRODUODENOSCOPY)
Anesthesia: Moderate Sedation

## 2013-09-14 MED ORDER — M.V.I. ADULT IV INJ
INJECTION | INTRAVENOUS | Status: AC
  Administered 2013-09-14: 17:00:00 via INTRAVENOUS
  Filled 2013-09-14: qty 3000

## 2013-09-14 MED ORDER — HYDROMORPHONE HCL PF 10 MG/ML IJ SOLN
0.0000 mg/h | INTRAMUSCULAR | Status: DC
  Administered 2013-09-14 (×2): 1 mg/h via INTRAVENOUS
  Administered 2013-09-14: 5 mg/h via INTRAVENOUS
  Administered 2013-09-14 – 2013-09-15 (×2): 1 mg/h via INTRAVENOUS
  Administered 2013-09-15: 6 mg/h via INTRAVENOUS
  Administered 2013-09-15 (×5): 1 mg/h via INTRAVENOUS
  Administered 2013-09-15: 7 mg/h via INTRAVENOUS
  Administered 2013-09-15: 5 mg/h via INTRAVENOUS
  Administered 2013-09-16 (×2): 1 mg/h via INTRAVENOUS
  Administered 2013-09-16: 7 mg/h via INTRAVENOUS
  Administered 2013-09-16: 1 mg/h via INTRAVENOUS
  Administered 2013-09-16 – 2013-09-17 (×5): 7 mg/h via INTRAVENOUS
  Administered 2013-09-18: 5 mg/h via INTRAVENOUS
  Administered 2013-09-18: 7 mg/h via INTRAVENOUS
  Administered 2013-09-19: 5 mg/h via INTRAVENOUS
  Filled 2013-09-14 (×15): qty 5

## 2013-09-14 MED ORDER — SODIUM CHLORIDE 0.9 % IV SOLN
INTRAVENOUS | Status: DC
  Administered 2013-09-15: 10 mL/h via INTRAVENOUS

## 2013-09-14 MED ORDER — FAT EMULSION 20 % IV EMUL
240.0000 mL | INTRAVENOUS | Status: AC
  Administered 2013-09-14: 240 mL via INTRAVENOUS
  Filled 2013-09-14: qty 250

## 2013-09-14 NOTE — Progress Notes (Signed)
Patient ID: Brandon [name], male   DOB: 01/01/1956, 58 y.o.   MRN: [id number] [facility] PROGRESS NOTE  JADRIAN BULMAN WLS:[id number] DOB: 07/18/1955 DOA: 09/09/2013 PCP: [name], MD  Brief narrative:  58 yo male with metastatic rectal cancer who is status post cycle 14 of 5-FU with last chemotherapy administered reportedly 1 week prior to admission. Presented with main concern of near syncope, hematemesis, and blood in ostomy bag. GI evaluated a patient with endoscopy and found duodenal ulcer most likely NSAID induced.   Active Problems:  Metastatic rectal carcinoma   Progressive based on CT scan, probably malignant obstruction with UGIB  NG placed yesterday with sedation. GI fluid has cleared up and volume has decreased.   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  On TPN but this is also becoming difficult to manage in the setting of volume overload and very poor clinical response, progressive deterioration, electrolyte disturbance  IV access is also now becoming an issue in the setting of volume overload Pt is very poor surgical candidate in the setting of progressive and invasive carcinoma  Palliative care team assistance is appreciated  Anasarca  Secondary to fluids pt's is receiving, TP, transfusions, NS  Weight on admission 157 lbs and this AM 168 lbs and unchanged over the past 24 hours  Again, this was addressed with pt's father as currently treatment options are very limited Pt's goal is to go home with hospice  Somnolence  From all the sedating medications pt is receiving: 3 fentanyl patches, in addition to inj, Dilaudid PCA, neurontin, benadryl, valium, percocet   More alert this AM and reports feeling better  Severe malnutrition  Secondary to progressive nature of the malignancy  On TPN Functional quadriplegia  Secondary to acute on chronic progressive illness, pt has been bed bound since admission Pt able to sit up in bed this AM Tachycardia  LE doppler negative for DVT DVT prophylaxis  Pt refusing SCD's, can not give heparin or lovenox due to high risk bleed   Consultants:  GI  Palliative care team  Procedures/Studies:  Nm Gi Blood Loss 09/12/2013 ADDENDUM REPORT: The case was re- reviewed in conjunction with CT of the abdomen and pelvis which of was obtained subsequent to this exam. The activity which was previously felt well likely represent radiotracer accumulation within the patient's left ostomy is more likely to represent pooling of radiotracer activity within the patient's obstructed stomach. It is difficult to say, with a high degree of certainty the whether or not the gastric activity is free pertechnetate or extravasated radiotracer from an active gastric bleed GI bleed. However, given the lack of significant increase in intensity over time this is favored to represent a likely free pertechnetate.  09/12/2013 Examination is positive for active GI bleeding with radiotracer accumulation in the patient's left abdominal ostomy. Source of bleeding is likely upper GI tract, either distal stomach or proximal duodenum.  Ct Angio Abd/pel W/ And/or W/o 09/12/2013  1. Interval worsening of ill-defined serpiginous retractile retroperitoneal soft mass which partially obstructs the bilateral ureters (now resulting moderate bilateral pelvicaliectasis and ureterectasis) and marked narrowing of the duodenum (resulting in at least partial functional obstruction with upstream dilatation of the  proximal duodenum and stomach).  2. 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  3. The celiac artery is retracted caudally secondary to the retroperitoneal fibrosis, though remains widely patent without hemodynamically significant narrowing. The celiac artery appears to be the dominant arterial supply to the intestines with mild hypertrophy of several pancreaticoduodenal arteries providing collateral supply to the SMA distribution. No evidence of pneumatosis or portal venous gas.  4. Post LAR with ill-defined soft tissue and fluid with the lower pelvis, presumably sequela of prior radiation change though locally recurrent disease is not excluded on the basis of this examination. Worsening retroperitoneal adenopathy worrisome for progression of provided history of rectal cancer. Correlation to prior outside examinations is recommended.  5. 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 No definable/drainable fluid collection within the abdomen or pelvis. 6. Suspected hepatic steatosis.  Antibiotics:  None   Code Status: DNR  Family Communication: Father at bedside  Disposition Plan: Remains inpatient   HPI/Subjective: No events overnight.   Objective: Filed Vitals:   09/14/13 0300 09/14/13 0400 09/14/13 0600 09/14/13 0800  BP: 140/103 142/95 149/96   Pulse: 105 113 106   Temp: 98.6 F (37 C) 97.9 F (36.6 C)  97.7 F (36.5 C)  TempSrc: Oral Oral  Oral  Resp: 10 18 11    Height:      Weight:      SpO2: 98% 99% 99%     Intake/Output Summary (Last 24 hours) at 09/14/13 0844 Last data filed at 09/14/13 0600  Gross per 24 hour  Intake   3515 ml  Output   5600 ml  Net  -2085 ml    Exam:   General:  Pt is alert, follows commands  appropriately, not in acute distress, frail   Cardiovascular: Regular rhythm, tachycardic, no rubs, no gallops  Respiratory: Bibasilar rhonchi   Abdomen: Soft, non tender,slightly distended, bowel sounds present, no guarding  Extremities: +2 bilateral LE pitting edema, pulses DP and PT palpable bilaterally  Neuro: Grossly nonfocal  Data Reviewed: Basic Metabolic Panel:  Recent Labs Lab 09/10/13 0550 09/11/13 0515 09/12/13 0430 09/13/13 0355 09/14/13 0500  NA 137 134* 132* 136* 134*  K 4.3 4.1 3.7 3.6* 4.2  CL 100 98 98 102 98  CO2 28 27 24 25 24   GLUCOSE 146* 176* 145* 156* 204*  BUN 30* 24* 21 21 20   CREATININE 0.70 0.75 0.75 0.74 0.73  CALCIUM 7.7* 7.7* 7.5* 7.7* 8.1*  MG 1.8 1.7  --  1.7 1.9  PHOS 3.3 3.6  --  3.7 3.9   Liver Function Tests:  Recent Labs Lab 09/09/13 0845 09/10/13 0550 09/13/13 0355  AST 53* 15 19  ALT 88* 60* 38  ALKPHOS 135* 113 114  BILITOT 0.5 0.3 0.4  PROT 5.0* 5.1* 5.0*  ALBUMIN 2.2* 2.2* 2.1*   CBC:  Recent Labs Lab 09/09/13 0845  09/10/13 0550  09/11/13 0150 09/11/13 1759 09/12/13 0430  09/13/13 0150 09/13/13 0355 09/13/13 1028 09/13/13 1828 09/14/13 0500  WBC 34.7*  < > 14.0*  < > 12.9* 11.1* 11.4*  --   --  8.1  --   --  8.7  NEUTROABS 29.8*  --  10.9*  --   --  8.7*  --   --   --  6.5  --   --  7.9*  HGB 7.3*  < > 9.2*  < > 7.6* 9.5* 9.2*  < > 8.8* 8.4* 8.1* 7.8* 9.7*  HCT 22.2*  < > 28.2*  < > 22.7* 28.3* 27.4*  < > 26.3* 25.4* 26.1* 26.3* 28.4*  MCV 101.8*  < > 93.4  < > 94.2 89.8 90.7  --   --  92.0  --   --  88.2  PLT 325  < > 232  < > 194 176 173  --   --  186  --   --  217  < > = values in this interval not displayed. Cardiac Enzymes:  Recent Labs Lab 09/12/13 1101 09/12/13 1725 09/12/13 2251  TROPONINI <0.30 <0.30 <0.30   CBG:  Recent Labs Lab 09/13/13 1218 09/13/13 1250 09/13/13 1634 09/13/13 2003 09/13/13 2353  GLUCAP 143* 160* 184* 175* 160*    Recent Results (from the past 240 hour(s))   CULTURE, BLOOD (ROUTINE X 2)     Status: None   Collection Time    09/09/13  8:45 AM      Result Value Ref Range Status   Specimen Description BLOOD PICC LINE   Final   Special Requests BOTTLES DRAWN AEROBIC AND ANAEROBIC 5CC EACHA   Final   Culture  Setup Time     Final   Value: 09/09/2013 14:17     Performed at [facility]   Culture     Final   Value:        BLOOD CULTURE RECEIVED NO GROWTH TO DATE CULTURE WILL BE HELD FOR 5 DAYS BEFORE ISSUING A FINAL NEGATIVE REPORT     Performed at [facility]   Report Status PENDING   Incomplete  CULTURE, BLOOD (ROUTINE X 2)     Status: None   Collection Time    09/09/13  9:23 AM      Result Value Ref Range Status   Specimen Description BLOOD RIGHT HAND   Final   Special Requests BOTTLES DRAWN AEROBIC AND ANAEROBIC 5CC EACH   Final   Culture  Setup Time     Final   Value: 09/09/2013 14:16     Performed at [facility]   Culture     Final   Value:        BLOOD CULTURE RECEIVED NO GROWTH TO DATE CULTURE WILL BE HELD FOR 5 DAYS BEFORE ISSUING A FINAL NEGATIVE REPORT     Performed at [facility]   Report Status PENDING   Incomplete  MRSA PCR SCREENING     Status: None   Collection Time    09/09/13 11:58 AM      Result Value Ref Range Status   MRSA by PCR NEGATIVE  NEGATIVE Final   Comment:            The GeneXpert MRSA Assay (FDA     approved for NASAL specimens     only), is one component of a     comprehensive MRSA colonization     surveillance program. It is not     intended to diagnose MRSA     infection nor to guide or     monitor treatment for     MRSA infections.  URINE CULTURE     Status: None   Collection Time  09/10/13  3:20 AM      Result Value Ref Range Status   Specimen Description URINE, RANDOM   Final   Special Requests NONE   Final   Culture  Setup Time     Final   Value: 09/10/2013 09:26     Performed at [place]     Final   Value: 6,000 COLONIES/ML      Performed at [facility]   Culture     Final   Value: INSIGNIFICANT GROWTH     Performed at [facility]   Report Status 09/11/2013 FINAL   Final     Scheduled Meds: . antiseptic oral rinse  15 mL Mouth Rinse q12n4p  . chlorhexidine  15 mL Mouth Rinse BID  . dexamethasone  4 mg Intravenous Q12H  . fentaNYL  100 mcg Transdermal Q72H  . fentaNYL  100 mcg Transdermal Q72H  . fentaNYL  100 mcg Transdermal Q72H  . insulin aspart  0-20 Units Subcutaneous 6 times per day  . scopolamine  1 patch Transdermal Q72H   Continuous Infusions: . sodium chloride    . Marland KitchenTPN (CLINIMIX-E) Adult 90 mL/hr at 09/13/13 1711   And  . fat emulsion 240 mL (09/13/13 2000)  . Marland KitchenTPN (CLINIMIX-E) Adult     And  . fat emulsion    . HYDROmorphone 4 mg/hr (09/14/13 0737)  . octreotide (SANDOSTATIN) infusion 50 mcg/hr (09/13/13 2343)  . pantoprozole (PROTONIX) infusion 8 mg/hr (09/13/13 1342)   Theodis Blaze, MD  [facility] Pager [phone]  If 7PM-7AM, please contact night-coverage [email or web address] Password [email or web address] 09/14/2013, 8:44 AM   LOS: 5 days

## 2013-09-14 NOTE — Progress Notes (Signed)
PARENTERAL NUTRITION CONSULT NOTE - Follow Up  Pharmacy Consult for TPN Indication: Short bowel d/t rectal cancer  Allergies  Allergen Reactions  . Codeine Nausea And Vomiting  . Effexor [Venlafaxine Hcl] Other (See Comments)    Flat Feeling  . Imuran [Azathioprine] Nausea And Vomiting    Patient Measurements: Height: 5\' 9"  (175.3 cm) Weight: 168 lb (76.204 kg) IBW/kg (Calculated) : 70.7  Vital Signs: Temp: 97.9 F (36.6 C) (06/05 0400) Temp src: Oral (06/05 0400) BP: 149/96 mmHg (06/05 0600) Pulse Rate: 106 (06/05 0600) Intake/Output from previous day: 06/04 0701 - 06/05 0700 In: 3660 [I.V.:1073; Blood:387; IV Piggyback:200; TPN:2000] Out: 5600 [Urine:2850; Emesis/NG output:2550; Stool:200] Intake/Output from this shift:    Labs:  Recent Labs  09/12/13 0430  09/13/13 0355 09/13/13 1028 09/13/13 1828 09/14/13 0500  WBC 11.4*  --  8.1  --   --  8.7  HGB 9.2*  < > 8.4* 8.1* 7.8* 9.7*  HCT 27.4*  < > 25.4* 26.1* 26.3* 28.4*  PLT 173  --  186  --   --  217  < > = values in this interval not displayed.   Recent Labs  09/12/13 0430 09/13/13 0355 09/14/13 0500  NA 132* 136* 134*  K 3.7 3.6* 4.2  CL 98 102 98  CO2 24 25 24   GLUCOSE 145* 156* 204*  BUN 21 21 20   CREATININE 0.75 0.74 0.73  CALCIUM 7.5* 7.7* 8.1*  MG  --  1.7 1.9  PHOS  --  3.7 3.9  PROT  --  5.0*  --   ALBUMIN  --  2.1*  --   AST  --  19  --   ALT  --  38  --   ALKPHOS  --  114  --   BILITOT  --  0.4  --    Estimated Creatinine Clearance: 101.9 ml/min (by C-G formula based on Cr of 0.73).    Recent Labs  09/13/13 1634 09/13/13 2003 09/13/13 2353  GLUCAP 184* 175* 160*    Insulin Requirements in the past 24 hours:  24 units SSI  Current Nutrition:  NPO Clinimix E 5/15 at a goal rate of 84ml/hr + 20% fat emulsion at 47ml/hr continuous infusions  IVF: NS at 20 ml/hr  Assessment: [name] w/ metastatic rectal cancer on TPN at home, admitted w/ syncope and vomiting blood. Also  on an ambulatory Dilaudid PCA and getting IV chemotherapy at home via [facility]. Managed by MDs at [facility] was to tx to [facility] when stabilized; however, pt continued to vomit significant amounts of blood and bleed from colostomy site which is being followed and treated by GI.  Recently was considering hospice. Pharmacy is asked to dose TPN while inpatient.    Glucose - CBGs uncontrolled now.  Had showed improvement since adjusting SSI to resistant scale q4h and changing IVF from D5-NS to NS but all values were above goal (< 150 mg/dL) yesterday.  Will add insulin to TNA.  Electrolytes - K+/Mag/Phos WNL.  Na 134, unable to adjust in TNA.  Other lytes ok.  Renal - SCr WNL  LFTs - now WNL  TGs - 163 (6/1)  Prealbumin - 23.1 (6/1)  Patient now has NG tube placed for decompression with high output.  Plan for repeat EGD today.  Palliative following.  On octreotide and PPI gtts.  Nutritional Goals:   RD's Estimated Nutritional Needs (6/1): Kcal: 8469-6295, Protein: 107-120 grams, Fluid: >/=2.3 L/day   TPN PTA: Cyclic infusion (  6p-6a), 2542ml delivered. Bag includes lipids one day a week. Daily avg: 1824Kcal and 96g protein. Standard MVI/TE. No insulin. Octreotide 36mcg/bag.  RD's recommendations this admission are higher than what patient is receiving in home TNA.  Will adjust inpatient TNA to meet RD's goals.  Clinimix E 5/20 at a goal rate of 61ml/hr + 20% fat emulsion at 5ml/hr to provide: 108g/day protein, 2380Kcal/day.  Providing over continuous infusion over 24 hours for now (as opposed to cycling over 12 hours) due to acute illness and increased nutrition goals.  Patient is receiving octreotide gtt.  TPN Access: PICC TPN day#: 6 (inpatient), has been on TPN for 9 months per patient report  Plan: At 1800:  Continue Clinimix E 5/20 at 90 ml/hr.    20% fat emulsion at 54ml/hr.  Add 10 units/L of regular insulin to TNA.  Continue SSI resistant scale q4h.   TNA to contain standard  multivitamins and trace elements.  IVF reduced to 20 ml/hr to account for volume provided by TNA.  Will reduce to KVO (10 ml/hr) since MD notes patient appears overloaded.  TNA lab panels on Mondays & Thursdays.  BMET in AM.  F/u daily.  Hershal Coria, PharmD, BCPS Pager: [phone] 09/14/2013 8:24 AM

## 2013-09-14 NOTE — Consult Note (Signed)
Reason for Consult:Nausea from gastric outlet obstruction, request for venting gastrostomy tube placement. Consulting Radiologist: Pascal Lux Referring Physician: Hilma Favors   HPI: Brandon [name] is an 58 y.o. male with complex medical and surgical history. Pt has metastatic rectal cancer with progressive tumor growth in the mesentery, which has caused high grade functional gastric outlet obstruction. This tumor also recently caused upper GI bleed, which appears to have ceased at present time. He was seen by Palliative services given his prognosis for survival. He had a CT which showed his disease progression, and due to his high grade partial gastric outpet obstruction, he has severe gastric distention with N/V. They were able to get an NGT down, which has provided a good bit of relief. IR is now asked to place venting gastrostomy tube. This will allow pt to eventually go home and will offer decompression as GOO not expected to improve if not worsen.  Past Medical History:  Past Medical History  Diagnosis Date  . Biliary dyskinesia   . Bowel obstruction   . Ulcerative colitis   . Allergy   . Depression   . Ileostomy in place   . ED (erectile dysfunction)   . Benign positional vertigo   . Prolactinoma   . Hypogonadism male   . History of blood transfusion   . Pituitary tumor 2009  . 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 Colon surgery  2003    Colectomy with ileostomy  . Rectal surgery  2009    Resection of rectal pouch  . Knee arthroscopy    . Cholecystectomy  04/26/2012    Procedure: LAPAROSCOPIC CHOLECYSTECTOMY;  Surgeon: Leighton Ruff, MD;  Location: [facility]S;  Service: General;  Laterality: N/A;  . Eus N/A 07/05/2012    Procedure: ESOPHAGEAL ENDOSCOPIC ULTRASOUND (EUS) RADIAL;  Surgeon: Arta Silence, MD;  Location: [facility] END[facility]COPY;  Service: Endoscopy;  Laterality: N/A;  celiac block  . Neurolytic celiac plexus N/A  07/05/2012    Procedure: NEUROLYTIC CELIAC PLEXUS;  Surgeon: Arta Silence, MD;  Location: [facility];  Service: Endoscopy;  Laterality: N/A;  . Esophagogastroduodenoscopy N/A 09/10/2013    Procedure: ESOPHAGOGASTRODUODENOSCOPY (EGD);  Surgeon: Missy Sabins, MD;  Location: [facility];  Service: Endoscopy;  Laterality: N/A;    Family History:  Family History  Problem Relation Age of Onset  . Cancer Mother     ovarian    Social History:  reports that he has never smoked. He has never used smokeless tobacco. He reports that he does not drink alcohol or use illicit drugs.  Allergies:  Allergies  Allergen Reactions  . Codeine Nausea And Vomiting  . Effexor [Venlafaxine Hcl] Other (See Comments)    Flat Feeling  . Imuran [Azathioprine] Nausea And Vomiting    Medications: Current facility-administered medications:0.9 %  sodium chloride infusion, , Intravenous, Continuous, Dara Hoyer, RPH, Last Rate: 10 mL/hr at 09/14/13 0949;  acetaminophen (TYLENOL) tablet 500 mg, 500 mg, Oral, Q4H PRN, Velvet Bathe, MD, 500 mg at 09/10/13 1007;  antiseptic oral rinse (BIOTENE) solution 15 mL, 15 mL, Mouth Rinse, q12n4p, Velvet Bathe, MD, 15 mL at 09/14/13 1125 chlorhexidine (PERIDEX) 0.12 % solution 15 mL, 15 mL, Mouth Rinse, BID, Velvet Bathe, MD, 15 mL at 09/14/13 0755;  chlorproMAZINE (THORAZINE) 25 mg in sodium chloride 0.9 % 25 mL IVPB, 25 mg, Intravenous, BID PRN, Allie Bossier, MD, 25 mg at 09/12/13 2350;  dexamethasone (DECADRON)  injection 4 mg, 4 mg, Intravenous, Q12H, Edsel Petrin, DO, 4 mg at 09/14/13 0948 diazepam (VALIUM) injection 5 mg, 5 mg, Intravenous, Q4H PRN, Edsel Petrin, DO;  fat emulsion 20 % infusion 240 mL, 240 mL, Intravenous, Continuous TPN, Teressa Lower, RPH, Last Rate: 10 mL/hr at 09/13/13 2000, 240 mL at 09/13/13 2000;  fat emulsion 20 % infusion 240 mL, 240 mL, Intravenous, Continuous TPN, Maryanna Shape Runyon, RPH fentaNYL (DURAGESIC - dosed mcg/hr) 100 mcg,  100 mcg, Transdermal, Q72H, Penny Pia, MD, 100 mcg at 09/12/13 1748;  fentaNYL (DURAGESIC - dosed mcg/hr) 100 mcg, 100 mcg, Transdermal, Q72H, Penny Pia, MD, 100 mcg at 09/13/13 1513;  fentaNYL (DURAGESIC - dosed mcg/hr) 100 mcg, 100 mcg, Transdermal, Q72H, Penny Pia, MD, 100 mcg at 09/14/13 1348 HYDROmorphone (DILAUDID) 0.5 mg/mL in sodium chloride 0.9 % 50 mL infusion, 1-5 mg/hr, Intravenous, Continuous, Edsel Petrin, DO, Last Rate: 8 mL/hr at 09/14/13 1300, 4 mg/hr at 09/14/13 1300;  HYDROmorphone (DILAUDID) bolus via infusion 1 mg, 1 mg, Intravenous, Q30 min PRN, Edsel Petrin, DO;  insulin aspart (novoLOG) injection 0-20 Units, 0-20 Units, Subcutaneous, 6 times per day, Teressa Lower, RPH, 4 Units at 09/14/13 1250 octreotide (SANDOSTATIN) 2 mcg/mL in sodium chloride 0.9 % 250 mL infusion, 50 mcg/hr, Intravenous, Continuous, Edsel Petrin, DO, Last Rate: 25 mL/hr at 09/14/13 1300, 50 mcg/hr at 09/14/13 1300;  pantoprazole (PROTONIX) 80 mg in sodium chloride 0.9 % 250 mL infusion, 8 mg/hr, Intravenous, Continuous, Edsel Petrin, DO, Last Rate: 25 mL/hr at 09/14/13 1348, 8 mg/hr at 09/14/13 1348 phenol (CHLORASEPTIC) mouth spray 1 spray, 1 spray, Mouth/Throat, PRN, Edsel Petrin, DO;  promethazine (PHENERGAN) injection 25 mg, 25 mg, Intravenous, Q4H PRN, Drema Dallas, MD, 25 mg at 09/13/13 7903;  scopolamine (TRANSDERM-SCOP) 1 MG/3DAYS 1.5 mg, 1 patch, Transdermal, Q72H, Edsel Petrin, DO, 1.5 mg at 09/13/13 1529;  TPN (CLINIMIX-E) Adult, , Intravenous, Continuous TPN, Teressa Lower, RPH, Last Rate: 90 mL/hr at 09/13/13 1711 TPN (CLINIMIX-E) Adult, , Intravenous, Continuous TPN, Maryanna Shape Runyon, RPH  ROS: See HPI for pertinent findings, otherwise complete 10 system review negative.  Physical Exam: Blood pressure 157/93, pulse 105, temperature 98.7 F (37.1 C), temperature source Oral, resp. rate 10, height 5\' 9"  (1.753 m), weight 168 lb (76.204 kg),  SpO2 100.00%. Gen: thin white male, NAD, but depressed affect ENT: NGT in (R)nare, dark bilious output. Airway widely patent. Lungs: CTA without w/r/r Heart: Regular Abdomen: (L)abd with enterostomy. (R)abd with end ileostomy/mucus fistula    Labs: CBC  Recent Labs  09/13/13 0355  09/14/13 0500 09/14/13 1000  WBC 8.1  --  8.7  --   HGB 8.4*  < > 9.7* 9.4*  HCT 25.4*  < > 28.4* 27.8*  PLT 186  --  217  --   < > = values in this interval not displayed. MET  Recent Labs  09/13/13 0355 09/14/13 0500  NA 136* 134*  K 3.6* 4.2  CL 102 98  CO2 25 24  GLUCOSE 156* 204*  BUN 21 20  CREATININE 0.74 0.73  CALCIUM 7.7* 8.1*    Recent Labs  09/13/13 0355  PROT 5.0*  ALBUMIN 2.1*  AST 19  ALT 38  ALKPHOS 114  BILITOT 0.4     Nm Gi Blood Loss  09/12/2013   ADDENDUM REPORT: 09/12/2013 17:26  ADDENDUM: The case was re- reviewed in conjunction with CT of the abdomen and pelvis which of was  obtained subsequent to this exam. The activity which was previously felt well likely represent radiotracer accumulation within the patient's left ostomy is more likely to represent pooling of radiotracer activity within the patient's obstructed stomach. It is difficult to say, with a high degree of certainty the whether or not the gastric activity is free pertechnetate or extravasated radiotracer from an active gastric bleed GI bleed. However, given the lack of significant increase in intensity over time this is favored to represent a likely free pertechnetate.   Electronically Signed   By: Kerby Moors M.D.   On: 09/12/2013 17:26   09/12/2013   CLINICAL DATA:  GI bleeding  EXAM: NUCLEAR MEDICINE GASTROINTESTINAL BLEEDING SCAN  TECHNIQUE: Sequential abdominal images were obtained following intravenous administration of Tc-51m labeled red blood cells.  RADIOPHARMACEUTICALS:  26.7 mCi Tc-33m in-vitro labeled red cells.  COMPARISON:  CTA 09/12/2013  FINDINGS: Dynamically acquired sequential images of  the abdomen and pelvis show a radiotracer accumulation within patient's left sided ostomy bag. The radiotracer is felt a likely originate from a proximal/ upper GI source, presumably distal stomach or proximal duodenum. Physiologic tracer activity is seen within the liver, spleen and urinary bladder as well as blood pool.  IMPRESSION: 1. Examination is positive for active GI bleeding with radiotracer accumulation in the patient's left abdominal ostomy. Source of bleeding is likely upper GI tract, either distal stomach or proximal duodenum.  Electronically Signed: By: Kerby Moors M.D. On: 09/12/2013 17:10   Ct Angio Abd/pel W/ And/or W/o  09/12/2013   CLINICAL DATA:  History of metastatic colon cancer (2009), post chemotherapy, post ileostomy now with nausea and vomiting and duodenal ulcer. Evaluate for source of bleed given undefined anatomy post ostomy revision.  EXAM: CT ANGIOGRAPHY ABDOMEN AND PELVIS WITH CONTRAST AND WITHOUT CONTRAST  TECHNIQUE: Multidetector CT imaging of the abdomen and pelvis was performed using the standard protocol during bolus administration of intravenous contrast. Multiplanar reconstructed images including MIPs were obtained and reviewed to evaluate the vascular anatomy.  CONTRAST:  173mL OMNIPAQUE IOHEXOL 350 MG/ML SOLN  COMPARISON:  CT of the abdomen pelvis - 09/12/2012  FINDINGS: Vascular Findings:  Abdominal aorta: Normal caliber of the abdominal aorta. No abdominal aortic dissection. Ill-defined sclerosing soft tissue within the lower abdomen/ upper pelvis abuts the anterior wall of the abdominal aorta though does not result in a hemodynamically significant stenosis.  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 While the cranial aspect of the retroperitoneal fibrosis abuts the caudal surface of the celiac artery, it does not result in a hemodynamically significant stenosis or vessel  narrowing. Conventional branching pattern. There is mild hypertrophy of multiple pancreaticoduodenal arterial collaterals, providing additional arterial supply to the SMA.  SMA: The retroperitoneal fibrosis circumferentially surrounds and narrows the SMA throughout its course (image 62, series 5), resulting in a long segment at least moderate narrowing, placing the majority of the small bowel at risk for ischemia.  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 The dominant right-sided renal artery is widely patent without hemodynamically significant narrowing.  Left Renal artery: Solitary; widely patent without hemodynamically significant narrowing.  IMA: Appears occluded shortly after its origin with reconstitution from collateral supply from the SMA.  Pelvic vasculature: The bilateral common, external and internal iliac arteries are of normal caliber and widely patent without hemodynamically significant stenosis.  Review of the MIP images  confirms the above findings.   --------------------------------------------------------------------------------  Nonvascular Findings:  There is an ill defined confluent slightly nodular soft tissue mass within the retroperitoneum which is noted to inwardly retract and partially obstruct the bilateral ureters (image 97, series 2 as well as markedly narrow the horizontal segment of the duodenum (image 80, series 2) and circumflex early in case and mildly diffusely narrow the SMA (represented image 74, series 2). These findings have all progressed since prior examination performed 10/02/2012 and worrisome for progression of retroperitoneal fibrosis.  Additionally, there has been progression of bulky pathologically enlarged mesenteric lymph nodes which have also been retracted posteriorly with index mesenteric lymph node measuring approximately 1.4 cm in greatest short axis diameter (image 97, series 2,  previously, 1.2 cm in diameter. This finding is worrisome for progression of provided history of metastatic rectal cancer.  Post total colectomy. There is an apparent left lower quadrant end ileostomy as well as an apparent right lower quadrant mucous fistula. The patient has undergone an apparent low anterior resection with ill-defined residual soft tissue within the lower pelvis with associated minimal amount of adjacent fluid within the lower pelvis, presumably sequela radiation change (image 132, series 2).  There is marked narrowing of the horizontal segment of the duodenum (image 80, series 2), secondary to the cranial aspect of the ill-defined retractile retroperitoneal mass. This finding is associated with marked air and fluid distension of the stomach and proximal duodenum. No pneumoperitoneum, pneumatosis or portal venous gas.  Normal hepatic contour. There is mild diffuse decreased attenuation of the hepatic parenchyma on this postcontrast examination suggestive of hepatic steatosis. No discrete hepatic lesions Post cholecystectomy. No intra or extra hepatic biliary duct dilatation. No ascites.  There is symmetric enhancement of the bilateral kidneys. Interval development of moderate bilateral pelvicaliectasis and ureterectasis to the level of the ill-defined soft tissue mass within the posterior aspect of the retroperitoneum (representative axial image 97, series 2). There is delayed excretion from the bilateral kidneys. There is a minimal amount of bilateral perinephric stranding. Note is made of a subcentimeter (approximately 0.8 cm) hypoattenuating lesion within the posterior aspect of the inferior pole of the left Kidney (79, series 4) which is too small to accurately characterize of favored to represent a renal cyst. No definite renal stones on this postcontrast examination.  Mild distention of the urinary bladder. No asymmetric urinary bladder wall thickening. Scattered minimal calcifications  within normal size prostate gland.  Normal appearance of the bilateral adrenal glands and spleen. The pancreas is markedly atrophic.  Limited visualization of the lower thorax demonstrates minimal subsegmental atelectasis within the bilateral lower lobes and image right middle lobe. No pleural effusion.  Normal heart size. The tip of a central venous catheter terminates within the superior cavoatrial junction. No pericardial effusion.  No acute or aggressive osseous abnormalities. There is straightening expected lumbar lordosis. Mild multilevel lumbar spine DDD, worse at L4-L5 and L5-S1 with disc space height loss, endplate irregularity and sclerosis.  There is a well-healed midline abdominal incision without evidence of hernia formation. There is diffuse body wall edema, most conspicuous about the bilateral thighs and buttocks.  IMPRESSION: 1. Interval worsening of ill-defined serpiginous retractile retroperitoneal soft mass which partially obstructs the bilateral ureters (now resulting moderate bilateral pelvicaliectasis and ureterectasis) and marked narrowing of the duodenum (resulting in at least partial functional obstruction with upstream dilatation of the proximal duodenum and stomach). 2. The worsening suspected retroperitoneal fibrosis now circumferentially encases the majority of the SMA a resulting  in a long segment at least moderate narrowing, placing the majority of the small bowel at risk for ischemia. 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 3. The celiac artery is retracted caudally secondary to the retroperitoneal fibrosis, though remains widely patent without hemodynamically significant narrowing. The celiac artery appears to be the dominant arterial supply to the intestines with mild hypertrophy of several pancreaticoduodenal arteries providing collateral supply to the SMA distribution. No evidence of  pneumatosis or portal venous gas. 4. Post LAR with ill-defined soft tissue and fluid with the lower pelvis, presumably sequela of prior radiation change though locally recurrent disease is not excluded on the basis of this examination. Worsening retroperitoneal adenopathy worrisome for progression of provided history of rectal cancer. Correlation to prior outside examinations is recommended. 5. 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 No definable/drainable fluid collection within the abdomen or pelvis. 6. Suspected hepatic steatosis.  Above findings were discussed by Dr. Markus Daft (interventional radiology) with Dr. Teena Irani at 17:38.   Electronically Signed   By: Sandi Mariscal M.D.   On: 09/12/2013 18:13    Assessment/Plan: High grade partial gastric outlet obstruction secondary to mesenteric tumor invasion(metastatic rectal cancer) Discussed role for palliative/venting gastrostomy tube to alleviate nausea/vomiting sxs. Anticipate eventual complete GOO, but for short time, pt could still 'sham' feed knowing decompressive g-tube in place. Explained procedure, risks and complications(bleeding, infection, leakage, need for exchange) Labs reviewed. Pt had several family members present today, but will discuss with his father before making final decision. Could do procedure as early as Mon 6/8 if pt is agreeable to move forward. IR team this weekend will follow up and schedule accordingly.  Ascencion Dike PA-C 09/14/2013, 1:46 PM

## 2013-09-14 NOTE — Progress Notes (Addendum)
[facility] Progress Note  Subjective: The patient except that the NG tube last night which brought back was described as feculent looking material as well as some old blood. He is overall more comfortable in more alert this morning. Minimal output from the ostomy. Received one unit of blood last night resulting hemoglobin of 9.7  Objective: Vital signs in last 24 hours: Temp:  [97.9 F (36.6 C)-99.7 F (37.6 C)] 97.9 F (36.6 C) (06/05 0400) Pulse Rate:  [105-127] 106 (06/05 0600) Resp:  [10-21] 11 (06/05 0600) BP: (134-156)/(89-103) 149/96 mmHg (06/05 0600) SpO2:  [97 %-100 %] 99 % (06/05 0600) Weight:  [76.204 kg (168 lb)] 76.204 kg (168 lb) (06/04 0900) Weight change:    PE: Essentially unchanged abdomen nondistended  Lab Results: Results for orders placed during the hospital encounter of 09/09/13 (from the past 24 hour(s))  HEMOGLOBIN AND HEMATOCRIT, BLOOD     Status: Abnormal   Collection Time    09/13/13 10:28 AM      Result Value Ref Range   Hemoglobin 8.1 (*) 13.0 - 17.0 g/dL   HCT 26.1 (*) 39.0 - 52.0 %  GLUCOSE, CAPILLARY     Status: Abnormal   Collection Time    09/13/13 12:18 PM      Result Value Ref Range   Glucose-Capillary 143 (*) 70 - 99 mg/dL   Comment 1 Notify RN    GLUCOSE, CAPILLARY     Status: Abnormal   Collection Time    09/13/13 12:50 PM      Result Value Ref Range   Glucose-Capillary 160 (*) 70 - 99 mg/dL   Comment 1 Documented in Chart     Comment 2 Notify RN    PREPARE RBC (CROSSMATCH)     Status: None   Collection Time    09/13/13  1:00 PM      Result Value Ref Range   Order Confirmation ORDER PROCESSED BY BLOOD BANK    GLUCOSE, CAPILLARY     Status: Abnormal   Collection Time    09/13/13  4:34 PM      Result Value Ref Range   Glucose-Capillary 184 (*) 70 - 99 mg/dL   Comment 1 Notify RN     Comment 2 Documented in Chart    HEMOGLOBIN AND HEMATOCRIT, BLOOD     Status: Abnormal   Collection Time    09/13/13  6:28 PM       Result Value Ref Range   Hemoglobin 7.8 (*) 13.0 - 17.0 g/dL   HCT 26.3 (*) 39.0 - 52.0 %  TYPE AND SCREEN     Status: None   Collection Time    09/13/13  6:45 PM      Result Value Ref Range   ABO/RH(D) O POS     Antibody Screen NEG     Sample Expiration 09/16/2013     Unit Number [id number]     Blood Component Type RED CELLS,LR     Unit division 00     Status of Unit ISSUED     Transfusion Status OK TO TRANSFUSE     Crossmatch Result Compatible    GLUCOSE, CAPILLARY     Status: Abnormal   Collection Time    09/13/13  8:03 PM      Result Value Ref Range   Glucose-Capillary 175 (*) 70 - 99 mg/dL   Comment 1 Documented in Chart     Comment 2 Notify RN    GLUCOSE, CAPILLARY  Status: Abnormal   Collection Time    09/13/13 11:53 PM      Result Value Ref Range   Glucose-Capillary 160 (*) 70 - 99 mg/dL  CBC WITH DIFFERENTIAL     Status: Abnormal   Collection Time    09/14/13  5:00 AM      Result Value Ref Range   WBC 8.7  4.0 - 10.5 K/uL   RBC 3.22 (*) 4.22 - 5.81 MIL/uL   Hemoglobin 9.7 (*) 13.0 - 17.0 g/dL   HCT 28.4 (*) 39.0 - 52.0 %   MCV 88.2  78.0 - 100.0 fL   MCH 30.1  26.0 - 34.0 pg   MCHC 34.2  30.0 - 36.0 g/dL   RDW 17.6 (*) 11.5 - 15.5 %   Platelets 217  150 - 400 K/uL   Neutrophils Relative % 90 (*) 43 - 77 %   Neutro Abs 7.9 (*) 1.7 - 7.7 K/uL   Lymphocytes Relative 4 (*) 12 - 46 %   Lymphs Abs 0.4 (*) 0.7 - 4.0 K/uL   Monocytes Relative 5  3 - 12 %   Monocytes Absolute 0.4  0.1 - 1.0 K/uL   Eosinophils Relative 0  0 - 5 %   Eosinophils Absolute 0.0  0.0 - 0.7 K/uL   Basophils Relative 0  0 - 1 %   Basophils Absolute 0.0  0.0 - 0.1 K/uL  MAGNESIUM     Status: None   Collection Time    09/14/13  5:00 AM      Result Value Ref Range   Magnesium 1.9  1.5 - 2.5 mg/dL  PHOSPHORUS     Status: None   Collection Time    09/14/13  5:00 AM      Result Value Ref Range   Phosphorus 3.9  2.3 - 4.6 mg/dL  BASIC METABOLIC PANEL     Status: Abnormal    Collection Time    09/14/13  5:00 AM      Result Value Ref Range   Sodium 134 (*) 137 - 147 mEq/L   Potassium 4.2  3.7 - 5.3 mEq/L   Chloride 98  96 - 112 mEq/L   CO2 24  19 - 32 mEq/L   Glucose, Bld 204 (*) 70 - 99 mg/dL   BUN 20  6 - 23 mg/dL   Creatinine, Ser 0.73  0.50 - 1.35 mg/dL   Calcium 8.1 (*) 8.4 - 10.5 mg/dL   GFR calc non Af Amer >90  >90 mL/min   GFR calc Af Amer >90  >90 mL/min    Studies/Results: Nm Gi Blood Loss  09/12/2013   ADDENDUM REPORT: 09/12/2013 17:26  ADDENDUM: The case was re- reviewed in conjunction with CT of the abdomen and pelvis which of was obtained subsequent to this exam. The activity which was previously felt well likely represent radiotracer accumulation within the patient's left ostomy is more likely to represent pooling of radiotracer activity within the patient's obstructed stomach. It is difficult to say, with a high degree of certainty the whether or not the gastric activity is free pertechnetate or extravasated radiotracer from an active gastric bleed GI bleed. However, given the lack of significant increase in intensity over time this is favored to represent a likely free pertechnetate.   Electronically Signed   By: Kerby Moors M.D.   On: 09/12/2013 17:26   09/12/2013   CLINICAL DATA:  GI bleeding  EXAM: NUCLEAR MEDICINE GASTROINTESTINAL BLEEDING SCAN  TECHNIQUE: Sequential abdominal  images were obtained following intravenous administration of Tc-25m labeled red blood cells.  RADIOPHARMACEUTICALS:  26.7 mCi Tc-61m in-vitro labeled red cells.  COMPARISON:  CTA 09/12/2013  FINDINGS: Dynamically acquired sequential images of the abdomen and pelvis show a radiotracer accumulation within patient's left sided ostomy bag. The radiotracer is felt a likely originate from a proximal/ upper GI source, presumably distal stomach or proximal duodenum. Physiologic tracer activity is seen within the liver, spleen and urinary bladder as well as blood pool.  IMPRESSION:  1. Examination is positive for active GI bleeding with radiotracer accumulation in the patient's left abdominal ostomy. Source of bleeding is likely upper GI tract, either distal stomach or proximal duodenum.  Electronically Signed: By: Kerby Moors M.D. On: 09/12/2013 17:10   Ct Angio Abd/pel W/ And/or W/o  09/12/2013   CLINICAL DATA:  History of metastatic colon cancer (2009), post chemotherapy, post ileostomy now with nausea and vomiting and duodenal ulcer. Evaluate for source of bleed given undefined anatomy post ostomy revision.  EXAM: CT ANGIOGRAPHY ABDOMEN AND PELVIS WITH CONTRAST AND WITHOUT CONTRAST  TECHNIQUE: Multidetector CT imaging of the abdomen and pelvis was performed using the standard protocol during bolus administration of intravenous contrast. Multiplanar reconstructed images including MIPs were obtained and reviewed to evaluate the vascular anatomy.  CONTRAST:  133mL OMNIPAQUE IOHEXOL 350 MG/ML SOLN  COMPARISON:  CT of the abdomen pelvis - 09/12/2012  FINDINGS: Vascular Findings:  Abdominal aorta: Normal caliber of the abdominal aorta. No abdominal aortic dissection. Ill-defined sclerosing soft tissue within the lower abdomen/ upper pelvis abuts the anterior wall of the abdominal aorta though does not result in a hemodynamically significant stenosis.  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 While the cranial aspect of the retroperitoneal fibrosis abuts the caudal surface of the celiac artery, it does not result in a hemodynamically significant stenosis or vessel narrowing. Conventional branching pattern. There is mild hypertrophy of multiple pancreaticoduodenal arterial collaterals, providing additional arterial supply to the SMA.  SMA: The retroperitoneal fibrosis circumferentially surrounds and narrows the SMA throughout its course (image 62, series 5), resulting in a long segment at least moderate  narrowing, placing the majority of the small bowel at risk for ischemia.  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 The dominant right-sided renal artery is widely patent without hemodynamically significant narrowing.  Left Renal artery: Solitary; widely patent without hemodynamically significant narrowing.  IMA: Appears occluded shortly after its origin with reconstitution from collateral supply from the SMA.  Pelvic vasculature: The bilateral common, external and internal iliac arteries are of normal caliber and widely patent without hemodynamically significant stenosis.  Review of the MIP images confirms the above findings.   --------------------------------------------------------------------------------  Nonvascular Findings:  There is an ill defined confluent slightly nodular soft tissue mass within the retroperitoneum which is noted to inwardly retract and partially obstruct the bilateral ureters (image 97, series 2 as well as markedly narrow the horizontal segment of the duodenum (image 80, series 2) and circumflex early in case and mildly diffusely narrow the SMA (represented image 74, series 2). These findings have all progressed since prior examination performed 10/02/2012 and worrisome for progression of retroperitoneal fibrosis.  Additionally, there has been progression of bulky pathologically enlarged mesenteric lymph nodes which have also been retracted posteriorly with index mesenteric lymph node measuring approximately 1.4 cm in greatest short axis diameter (image  97, series 2, previously, 1.2 cm in diameter. This finding is worrisome for progression of provided history of metastatic rectal cancer.  Post total colectomy. There is an apparent left lower quadrant end ileostomy as well as an apparent right lower quadrant mucous fistula. The patient has undergone an apparent low anterior resection with ill-defined  residual soft tissue within the lower pelvis with associated minimal amount of adjacent fluid within the lower pelvis, presumably sequela radiation change (image 132, series 2).  There is marked narrowing of the horizontal segment of the duodenum (image 80, series 2), secondary to the cranial aspect of the ill-defined retractile retroperitoneal mass. This finding is associated with marked air and fluid distension of the stomach and proximal duodenum. No pneumoperitoneum, pneumatosis or portal venous gas.  Normal hepatic contour. There is mild diffuse decreased attenuation of the hepatic parenchyma on this postcontrast examination suggestive of hepatic steatosis. No discrete hepatic lesions Post cholecystectomy. No intra or extra hepatic biliary duct dilatation. No ascites.  There is symmetric enhancement of the bilateral kidneys. Interval development of moderate bilateral pelvicaliectasis and ureterectasis to the level of the ill-defined soft tissue mass within the posterior aspect of the retroperitoneum (representative axial image 97, series 2). There is delayed excretion from the bilateral kidneys. There is a minimal amount of bilateral perinephric stranding. Note is made of a subcentimeter (approximately 0.8 cm) hypoattenuating lesion within the posterior aspect of the inferior pole of the left Kidney (79, series 4) which is too small to accurately characterize of favored to represent a renal cyst. No definite renal stones on this postcontrast examination.  Mild distention of the urinary bladder. No asymmetric urinary bladder wall thickening. Scattered minimal calcifications within normal size prostate gland.  Normal appearance of the bilateral adrenal glands and spleen. The pancreas is markedly atrophic.  Limited visualization of the lower thorax demonstrates minimal subsegmental atelectasis within the bilateral lower lobes and image right middle lobe. No pleural effusion.  Normal heart size. The tip of a  central venous catheter terminates within the superior cavoatrial junction. No pericardial effusion.  No acute or aggressive osseous abnormalities. There is straightening expected lumbar lordosis. Mild multilevel lumbar spine DDD, worse at L4-L5 and L5-S1 with disc space height loss, endplate irregularity and sclerosis.  There is a well-healed midline abdominal incision without evidence of hernia formation. There is diffuse body wall edema, most conspicuous about the bilateral thighs and buttocks.  IMPRESSION: 1. Interval worsening of ill-defined serpiginous retractile retroperitoneal soft mass which partially obstructs the bilateral ureters (now resulting moderate bilateral pelvicaliectasis and ureterectasis) and marked narrowing of the duodenum (resulting in at least partial functional obstruction with upstream dilatation of the proximal duodenum and stomach). 2. 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 3. The celiac artery is retracted caudally secondary to the retroperitoneal fibrosis, though remains widely patent without hemodynamically significant narrowing. The celiac artery appears to be the dominant arterial supply to the intestines with mild hypertrophy of several pancreaticoduodenal arteries providing collateral supply to the SMA distribution. No evidence of pneumatosis or portal venous gas. 4. Post LAR with ill-defined soft tissue and fluid with the lower pelvis, presumably sequela of prior radiation change though locally recurrent disease is not excluded on the basis  of this examination. Worsening retroperitoneal adenopathy worrisome for progression of provided history of rectal cancer.  Correlation to prior outside examinations is recommended. 5. 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 No definable/drainable fluid collection within the abdomen or pelvis. 6. Suspected hepatic steatosis.  Above findings were discussed by Dr. Markus Daft (interventional radiology) with Dr. Teena Irani at 17:38.   Electronically Signed   By: Sandi Mariscal M.D.   On: 09/12/2013 18:13      Assessment: Duodenal ulcer possibly probably associated with metastatic malignancy causing a functional obstruction of the second or third portion of the duodenum, status post placement of NG tube. Bleeding seems to have stopped.  Plan: Palliative care has been involved and some modification of goals of care. Probably little diagnostic/therapeutic benefit from repeat endoscopy at this point. May be a candidate for a decompressive PEG tube which would probably best be placed radiologically given his difficult anatomy and scattered areas of retroperitoneal fibrosis and possible/ probable metastatic tumor. Patient will consider this option vs NG suction.    Missy Sabins 09/14/2013, 8:25 AM

## 2013-09-14 NOTE — Progress Notes (Signed)
09/14/13 1115  Clinical Encounter Type  Visited With Patient not available   Attempted visit to introduce Brandon [name], but pt was sleeping.  No family present.  Please page [phone] for chaplain support as needed/desired.  Thank you.  [place], [place]

## 2013-09-14 NOTE — Progress Notes (Signed)
[id number]/palliative care consult completed/home with hoscipe when stable/ng tube draining a fecal material/pt is very irritiable/awaiting decision as to next step/Rhonda Davis,RN,BSn,CCM

## 2013-09-14 NOTE — Progress Notes (Signed)
Palliative Care Team at [place] Note   SUBJECTIVE: Brandon [name] is very irritable this AM, but overall much improved since yesterday afternoon. Once we got him sedated and placed the NG tube he had significant relief. He is tolerating the NG for now. His father is at the bedside.  OBJECTIVE: Vital Signs: BP 149/96  Pulse 106  Temp(Src) 97.7 F (36.5 C) (Oral)  Resp 11  Ht 5\' 9"  (1.753 m)  Wt 76.204 kg (168 lb)  BMI 24.80 kg/m2  SpO2 99%   Intake and Output: 06/04 0701 - 06/05 0700 In: 4166 [I.V.:1073; Blood:387; IV Piggyback:200; TPN:2000] Out: 0630 [Urine:2850; Emesis/NG output:2550; Stool:200]  Physical Exam: General: Chronically ill appearing, lethargic but irritable  Head: Normal, NG in nare without trauma  Lungs:  normal  Heart: Tachy  Abdomen:  Less distended, ostomy ok  Extremities: +edema    Allergies  Allergen Reactions  . Codeine Nausea And Vomiting  . Effexor [Venlafaxine Hcl] Other (See Comments)    Flat Feeling  . Imuran [Azathioprine] Nausea And Vomiting    Medications: Scheduled Meds:  . antiseptic oral rinse  15 mL Mouth Rinse q12n4p  . chlorhexidine  15 mL Mouth Rinse BID  . dexamethasone  4 mg Intravenous Q12H  . fentaNYL  100 mcg Transdermal Q72H  . fentaNYL  100 mcg Transdermal Q72H  . fentaNYL  100 mcg Transdermal Q72H  . insulin aspart  0-20 Units Subcutaneous 6 times per day  . scopolamine  1 patch Transdermal Q72H    Continuous Infusions: . sodium chloride    . Marland KitchenTPN (CLINIMIX-E) Adult 90 mL/hr at 09/13/13 1711   And  . fat emulsion 240 mL (09/13/13 2000)  . Marland KitchenTPN (CLINIMIX-E) Adult     And  . fat emulsion    . HYDROmorphone 4 mg/hr (09/14/13 0737)  . octreotide (SANDOSTATIN) infusion 50 mcg/hr (09/13/13 2343)  . pantoprozole (PROTONIX) infusion 8 mg/hr (09/13/13 1342)    PRN Meds: acetaminophen, chlorproMAZINE (THORAZINE) IV, diazepam, HYDROmorphone, phenol, promethazine   Labs: CBC    Component Value Date/Time   WBC 8.7 09/14/2013 0500   RBC 3.22* 09/14/2013 0500   HGB 9.7* 09/14/2013 0500   HCT 28.4* 09/14/2013 0500   PLT 217 09/14/2013 0500   MCV 88.2 09/14/2013 0500   MCH 30.1 09/14/2013 0500   MCHC 34.2 09/14/2013 0500   RDW 17.6* 09/14/2013 0500   LYMPHSABS 0.4* 09/14/2013 0500   MONOABS 0.4 09/14/2013 0500   EOSABS 0.0 09/14/2013 0500   BASOSABS 0.0 09/14/2013 0500    CMET     Component Value Date/Time   NA 134* 09/14/2013 0500   K 4.2 09/14/2013 0500   CL 98 09/14/2013 0500   CO2 24 09/14/2013 0500   GLUCOSE 204* 09/14/2013 0500   BUN 20 09/14/2013 0500   CREATININE 0.73 09/14/2013 0500   CALCIUM 8.1* 09/14/2013 0500   PROT 5.0* 09/13/2013 0355   ALBUMIN 2.1* 09/13/2013 0355   AST 19 09/13/2013 0355   ALT 38 09/13/2013 0355   ALKPHOS 114 09/13/2013 0355   BILITOT 0.4 09/13/2013 0355   GFRNONAA >90 09/14/2013 0500   GFRAA >90 09/14/2013 0500    ASSESSMENT/ PLAN: 58 yo with metastatic rectal cancer, 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 NG placed yesterday with sedation. GI fluid has cleared up and volume has decreased.  -Continue continuous octreotide infusion- at discharge will give him IM depot Octreotide LAR -High dose PPI infusion -Decradron-will decrease dose today  His goal is to go home-with hospice and avoid  hospitalization-he feels restricted here and has a very high level of mistrust for healthcare providers.  His dad has been managing all of his care at home including TPN.  Maintain current treatment for now and monitor NG output.  Time In: 8 Time Out: 9AM Total Time Spent with Patient:  60 minutes Total Overall Time: 60 minutes   Greater than 50%  of this time was spent counseling and coordinating care related to the above assessment and plan.   Acquanetta Chain, DO  09/14/2013, 8:56 AM  Please contact Palliative Medicine Team phone at [phone] for questions and concerns.

## 2013-09-15 ENCOUNTER — Inpatient Hospital Stay (HOSPITAL_COMMUNITY): Payer: BC Managed Care – PPO

## 2013-09-15 LAB — BASIC METABOLIC PANEL
BUN: 21 mg/dL (ref 6–23)
CALCIUM: 8.1 mg/dL — AB (ref 8.4–10.5)
CO2: 24 mEq/L (ref 19–32)
Chloride: 99 mEq/L (ref 96–112)
Creatinine, Ser: 0.69 mg/dL (ref 0.50–1.35)
GFR calc non Af Amer: >90 mL/min (ref >90)
GLUCOSE: 159 mg/dL — AB (ref 70–99)
POTASSIUM: 4.2 meq/L (ref 3.7–5.3)
SODIUM: 134 meq/L — AB (ref 137–147)

## 2013-09-15 LAB — CULTURE, BLOOD (ROUTINE X 2)
CULTURE: NO GROWTH
Culture: NO GROWTH

## 2013-09-15 LAB — GLUCOSE, CAPILLARY
GLUCOSE-CAPILLARY: 186 mg/dL — AB (ref 70–99)
GLUCOSE-CAPILLARY: 151 mg/dL — AB (ref 70–99)
GLUCOSE-CAPILLARY: 139 mg/dL — AB (ref 70–99)
GLUCOSE-CAPILLARY: 123 mg/dL — AB (ref 70–99)
Glucose-Capillary: 164 mg/dL — ABNORMAL HIGH (ref 70–99)

## 2013-09-15 LAB — HEMOGLOBIN AND HEMATOCRIT, BLOOD
HCT: 28.1 % — ABNORMAL LOW (ref 39.0–52.0)
HCT: 30.8 % — ABNORMAL LOW (ref 39.0–52.0)
Hemoglobin: 9.6 g/dL — ABNORMAL LOW (ref 13.0–17.0)
Hemoglobin: 10.3 g/dL — ABNORMAL LOW (ref 13.0–17.0)

## 2013-09-15 LAB — CBC WITH DIFFERENTIAL/PLATELET
Basophils Absolute: 0 10*3/uL (ref 0.0–0.1)
Basophils Relative: 0 % (ref 0–1)
Eosinophils Absolute: 0 10*3/uL (ref 0.0–0.7)
Eosinophils Relative: 0 % (ref 0–5)
HEMATOCRIT: 28.5 % — AB (ref 39.0–52.0)
HEMOGLOBIN: 9.7 g/dL — AB (ref 13.0–17.0)
LYMPHS PCT: 4 % — AB (ref 12–46)
Lymphs Abs: 0.5 10*3/uL — ABNORMAL LOW (ref 0.7–4.0)
MCH: 30 pg (ref 26.0–34.0)
MCHC: 34 g/dL (ref 30.0–36.0)
MCV: 88.2 fL (ref 78.0–100.0)
MONO ABS: 0.7 10*3/uL (ref 0.1–1.0)
Monocytes Relative: 6 % (ref 3–12)
NEUTROS ABS: 10.3 10*3/uL — AB (ref 1.7–7.7)
Neutrophils Relative %: 90 % — ABNORMAL HIGH (ref 43–77)
Platelets: 256 10*3/uL (ref 150–400)
RBC: 3.23 MIL/uL — ABNORMAL LOW (ref 4.22–5.81)
RDW: 17.4 % — AB (ref 11.5–15.5)
WBC: 11.6 10*3/uL — AB (ref 4.0–10.5)

## 2013-09-15 LAB — PROTIME-INR
INR: 0.98 (ref 0.00–1.49)
Prothrombin Time: 12.8 seconds (ref 11.6–15.2)

## 2013-09-15 LAB — PHOSPHORUS: Phosphorus: 3.7 mg/dL (ref 2.3–4.6)

## 2013-09-15 LAB — MAGNESIUM: Magnesium: 1.7 mg/dL (ref 1.5–2.5)

## 2013-09-15 MED ORDER — FAT EMULSION 20 % IV EMUL
240.0000 mL | INTRAVENOUS | Status: AC
  Administered 2013-09-15: 240 mL via INTRAVENOUS
  Filled 2013-09-15: qty 250

## 2013-09-15 MED ORDER — FUROSEMIDE 10 MG/ML IJ SOLN
20.0000 mg | Freq: Once | INTRAMUSCULAR | Status: AC
  Administered 2013-09-15: 20 mg via INTRAVENOUS
  Filled 2013-09-15: qty 2

## 2013-09-15 MED ORDER — FUROSEMIDE 10 MG/ML IJ SOLN
40.0000 mg | Freq: Once | INTRAMUSCULAR | Status: AC
  Administered 2013-09-15: 40 mg via INTRAVENOUS
  Filled 2013-09-15: qty 4

## 2013-09-15 MED ORDER — TRACE MINERALS CR-CU-F-FE-I-MN-MO-SE-ZN IV SOLN
INTRAVENOUS | Status: AC
  Administered 2013-09-15: 17:00:00 via INTRAVENOUS
  Filled 2013-09-15: qty 3000

## 2013-09-15 NOTE — Progress Notes (Signed)
Patient remains about the same. I discussed his situation with Dr. Hilma Favors from palliative care and also with his father. The plan is for an IR consult for a decompressive PEG tube on Monday. At this point I have nothing further to suggest. If the PEG tube is successful the plan will be for him to be discharged with hospice. Nothing further to add at this time.

## 2013-09-15 NOTE — Progress Notes (Signed)
PARENTERAL NUTRITION CONSULT NOTE - Follow Up  Pharmacy Consult for TPN Indication: Short bowel d/t rectal cancer  Allergies  Allergen Reactions  . Codeine Nausea And Vomiting  . Effexor [Venlafaxine Hcl] Other (See Comments)    Flat Feeling  . Imuran [Azathioprine] Nausea And Vomiting    Patient Measurements: Height: 5\' 9"  (175.3 cm) Weight: 167 lb 1.7 oz (75.8 kg) IBW/kg (Calculated) : 70.7  Vital Signs: Temp: 97.8 F (36.6 C) (06/06 0800) Temp src: Oral (06/06 0800) BP: 156/86 mmHg (06/06 1000) Pulse Rate: 98 (06/06 1000) Intake/Output from previous day: 06/05 0701 - 06/06 0700 In: 3147 [I.V.:957; TPN:2190] Out: 1850 [Urine:1150; Emesis/NG output:600; Stool:100] Intake/Output from this shift: Total I/O In: 478 [I.V.:178; TPN:300] Out: -   Labs:  Recent Labs  09/13/13 0355  09/14/13 0500  09/14/13 1911 09/15/13 0500 09/15/13 1007  WBC 8.1  --  8.7  --   --  11.6*  --   HGB 8.4*  < > 9.7*  < > 9.6* 9.7* 9.6*  HCT 25.4*  < > 28.4*  < > 29.3* 28.5* 28.1*  PLT 186  --  217  --   --  256  --   INR  --   --   --   --   --  0.98  --   < > = values in this interval not displayed.   Recent Labs  09/13/13 0355 09/14/13 0500 09/15/13 0500  NA 136* 134* 134*  K 3.6* 4.2 4.2  CL 102 98 99  CO2 25 24 24   GLUCOSE 156* 204* 159*  BUN 21 20 21   CREATININE 0.74 0.73 0.69  CALCIUM 7.7* 8.1* 8.1*  MG 1.7 1.9 1.7  PHOS 3.7 3.9 3.7  PROT 5.0*  --   --   ALBUMIN 2.1*  --   --   AST 19  --   --   ALT 38  --   --   ALKPHOS 114  --   --   BILITOT 0.4  --   --    Estimated Creatinine Clearance: 101.9 ml/min (by C-G formula based on Cr of 0.69).    Recent Labs  09/14/13 2357 09/15/13 0353 09/15/13 0735  GLUCAP 123* 186* 151*    Insulin Requirements in the past 24 hours:  14 units SSI 30 units insulin in TNA delivering 21.6 units/24h  Current Nutrition:  NPO Clinimix E 5/15 at a goal rate of 72ml/hr + 20% fat emulsion at 46ml/hr continuous  infusions  IVF: NS at 10 ml/hr  Assessment: [name] w/ metastatic rectal cancer on TPN at home, admitted w/ syncope and vomiting blood. Also on an ambulatory Dilaudid PCA and getting IV chemotherapy at home via [facility]. Managed by MDs at [facility] was to tx to [facility] when stabilized; however, pt continued to vomit significant amounts of blood and bleed from colostomy site which is being followed and treated by GI.  Recently was considering hospice. Pharmacy is asked to dose TPN while inpatient.    Glucose - CBGs better controlled on octreotide gtt on resistant SSI and 30 units insulin in TNA.  CBGs 123-186  Electrolytes - K+/Mag/Phos WNL.  Na 134, unable to adjust in TNA.  Other lytes ok.  Renal - SCr WNL  LFTs - now WNL  TGs - 163 (6/1)  Prealbumin - 23.1 (6/1)  Still with NG tube placed for decompression with high output.  Plan for repeat EGD today.  Palliative following.  On octreotide and PPI gtts.  Nutritional Goals:   RD's Estimated Nutritional Needs (6/1): Kcal: 2836-6294, Protein: 107-120 grams, Fluid: >/=2.3 L/day   TPN PTA: Cyclic infusion (7M-5Y), 2528ml delivered. Bag includes lipids one day a week. Daily avg: 1824Kcal and 96g protein. Standard MVI/TE. No insulin. Octreotide 351mcg/bag.  RD's recommendations this admission are higher than what patient is receiving in home TNA.  Will adjust inpatient TNA to meet RD's goals.  Clinimix E 5/20 at a goal rate of 45ml/hr + 20% fat emulsion at 30ml/hr to provide: 108g/day protein, 2380Kcal/day.  Providing over continuous infusion over 24 hours for now (as opposed to cycling over 12 hours) due to acute illness and increased nutrition goals.  Patient is receiving octreotide gtt.  TPN Access: PICC TPN day#: 7 (inpatient), has been on TPN for 9 months per patient report  Plan: At 1800:  Continue Clinimix E 5/20 at 90 ml/hr.    20% fat emulsion at 60ml/hr.  Continue 10 units/L of regular insulin to TNA.  Continue SSI resistant  scale q4h.   TNA to contain standard multivitamins and trace elements.  Continue IVF reduced to 10 ml/hr to account for volume provided by TNA  TNA lab panels on Mondays & Thursdays  F/u daily.  Thank you for the consult.  Johny Drilling, PharmD, BCPS Pager: [phone] Pharmacy: [phone] 09/15/2013 11:55 AM

## 2013-09-15 NOTE — Progress Notes (Addendum)
Patient ID: Brandon [name], male   DOB: 12/27/1955, 58 y.o.   MRN: [id number] [facility] PROGRESS NOTE  Brandon [name] JAS:[id number] DOB: 12/07/1955 DOA: 09/09/2013 PCP: Brandon Screws, MD  Brief narrative:  58 yo male with metastatic rectal cancer who is status post cycle 14 of 5-FU with last chemotherapy administered reportedly 1 week prior to admission. Presented with main concern of near syncope, hematemesis, and blood in ostomy bag. GI evaluated a patient with endoscopy and found duodenal ulcer most likely NSAID induced.   Active Problems:  Metastatic rectal carcinoma  Progressive based on CT scan, probably malignant obstruction with UGIB  NG placed 6/5,with sedation. GI fluid has cleared up and volume has decreased.  NGT came out 6/6 but placed back with no problems  Continue continuous octreotide infusion  Pt doing better this AM and reports some improvement but has more pain in the back area  Greatly appreciate palliative care team and GI team input and assistance  Acute GI bleed  Possible duodenal invasion with tumor vs duodenal [place] as per initial EGD  Improving after NG tube placed 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  On TPN but this is also becoming difficult to manage in the setting of volume overload and very poor clinical response, progressive deterioration, electrolyte disturbance  IV access is also now becoming an issue in the setting of volume overload  Pt is very poor surgical candidate in the setting of progressive and invasive carcinoma  Palliative care team assistance is appreciated  IR to place PEG on Monday  Anasarca  Secondary to fluids pt's is receiving, TP, transfusions, NS  Weight on admission 157 lbs and this AM 168 lbs --> 167 lbs this AM Again, this was addressed with pt's father as currently treatment options are very limited  Pt's goal is to go home with hospice  Will provide one dose of Lasix today and  will monitor clinical response  Somnolence  From all the sedating medications pt is receiving: 3 fentanyl patches, in addition to inj, Dilaudid PCA, neurontin, benadryl, valium, percocet  More alert this AM and reports feeling better  Severe malnutrition  Secondary to progressive nature of the malignancy  On TPN Plan for PEG placement Monday  Functional quadriplegia  Secondary to acute on chronic progressive illness, pt has been bed bound since admission  Pt able to sit up in bed this AM Tachycardia  LE doppler negative for DVT DVT prophylaxis  Pt refusing SCD's, can not give heparin or lovenox due to high risk bleed   Consultants:  GI  Palliative care team  Procedures/Studies:  Nm Gi Blood Loss 09/12/2013 ADDENDUM REPORT: The case was re- reviewed in conjunction with CT of the abdomen and pelvis which of was obtained subsequent to this exam. The activity which was previously felt well likely represent radiotracer accumulation within the patient's left ostomy is more likely to represent pooling of radiotracer activity within the patient's obstructed stomach. It is difficult to say, with a high degree of certainty the whether or not the gastric activity is free pertechnetate or extravasated radiotracer from an active gastric bleed GI bleed. However, given the lack of significant increase in intensity over time this is favored to represent a likely free pertechnetate.  09/12/2013 Examination is positive for active GI bleeding with radiotracer accumulation in the patient's left abdominal ostomy. Source of bleeding is likely upper GI tract, either distal stomach or proximal duodenum.  Ct Angio Abd/pel  W/ And/or W/o 09/12/2013  1. Interval worsening of ill-defined serpiginous retractile retroperitoneal soft mass which partially obstructs the bilateral ureters (now resulting moderate bilateral pelvicaliectasis and ureterectasis) and marked narrowing of the duodenum (resulting in at least partial  functional obstruction with upstream dilatation of the proximal duodenum and stomach).  2. 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  3. The celiac artery is retracted caudally secondary to the retroperitoneal fibrosis, though remains widely patent without hemodynamically significant narrowing. The celiac artery appears to be the dominant arterial supply to the intestines with mild hypertrophy of several pancreaticoduodenal arteries providing collateral supply to the SMA distribution. No evidence of pneumatosis or portal venous gas.  4. Post LAR with ill-defined soft tissue and fluid with the lower pelvis, presumably sequela of prior radiation change though locally recurrent disease is not excluded on the basis of this examination. Worsening retroperitoneal adenopathy worrisome for progression of provided history of rectal cancer. Correlation to prior outside examinations is recommended.  5. 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 No definable/drainable fluid collection within the abdomen or pelvis. 6. Suspected hepatic steatosis.  Antibiotics:  None   Code Status: DNR  Family Communication: Father at bedside  Disposition Plan: Remains inpatient    HPI/Subjective: No events overnight.   Objective: Filed Vitals:   09/14/13 2359 09/15/13 0000 09/15/13 0359 09/15/13 0400  BP:  164/90  159/86  Pulse:  109  97  Temp: 98.6 F (37 C)  98.2 F (36.8 C)   TempSrc: Oral  Oral   Resp:  14  19  Height:      Weight:   75.8 kg (167 lb 1.7 oz)   SpO2:  98%  97%     Intake/Output Summary (Last 24 hours) at 09/15/13 0735 Last data filed at 09/15/13 0500  Gross per 24 hour  Intake   2987 ml  Output   1850 ml  Net   1137 ml    Exam:   General:  Pt is alert, follows commands appropriately, not in acute distress, frail   Cardiovascular: Regular rate and rhythm, S1/S2, no murmurs, no rubs, no gallops  Respiratory: Clear to auscultation bilaterally, no wheezing, no crackles, no rhonchi  Abdomen: Soft, non tender, slightly distended, bowel sounds soft, no guarding  Extremities: +2 bilateral LEpitting edema, pulses DP and PT palpable bilaterally  Neuro: Grossly nonfocal  Data Reviewed: Basic Metabolic Panel:  Recent Labs Lab 09/10/13 0550 09/11/13 0515 09/12/13 0430 09/13/13 0355 09/14/13 0500 09/15/13 0500  NA 137 134* 132* 136* 134* 134*  K 4.3 4.1 3.7 3.6* 4.2 4.2  CL 100 98 98 102 98 99  CO2 28 27 24 25 24 24   GLUCOSE 146* 176* 145* 156* 204* 159*  BUN 30* 24* 21 21 20 21   CREATININE 0.70 0.75 0.75 0.74 0.73 0.69  CALCIUM 7.7* 7.7* 7.5* 7.7* 8.1* 8.1*  MG 1.8 1.7  --  1.7 1.9 1.7  PHOS 3.3 3.6  --  3.7 3.9 3.7   Liver Function Tests:  Recent Labs Lab 09/09/13 0845 09/10/13 0550 09/13/13 0355  AST 53* 15 19  ALT 88* 60* 38  ALKPHOS  135* 113 114  BILITOT 0.5 0.3 0.4  PROT 5.0* 5.1* 5.0*  ALBUMIN 2.2* 2.2* 2.1*   CBC:  Recent Labs Lab 09/10/13 0550  09/11/13 1759 09/12/13 0430  09/13/13 0355  09/13/13 1828 09/14/13 0500 09/14/13 1000 09/14/13 1911 09/15/13 0500  WBC 14.0*  < > 11.1* 11.4*  --  8.1  --   --  8.7  --   --  11.6*  NEUTROABS 10.9*  --  8.7*  --   --  6.5  --   --  7.9*  --   --  10.3*  HGB 9.2*  < > 9.5* 9.2*  < > 8.4*  < > 7.8* 9.7* 9.4* 9.6* 9.7*  HCT 28.2*  < > 28.3* 27.4*  < > 25.4*  < > 26.3* 28.4* 27.8* 29.3* 28.5*  MCV 93.4  < > 89.8 90.7  --  92.0  --   --  88.2  --   --  88.2  PLT 232  < > 176 173  --  186  --   --  217  --   --  256  < > = values in this interval not  displayed. Cardiac Enzymes:  Recent Labs Lab 09/12/13 1101 09/12/13 1725 09/12/13 2251  TROPONINI <0.30 <0.30 <0.30   CBG:  Recent Labs Lab 09/14/13 1246 09/14/13 1624 09/14/13 2051 09/14/13 2357 09/15/13 0353  GLUCAP 178* 173* 125* 123* 186*    Recent Results (from the past 240 hour(s))  CULTURE, BLOOD (ROUTINE X 2)     Status: None   Collection Time    09/09/13  8:45 AM      Result Value Ref Range Status   Specimen Description BLOOD PICC LINE   Final   Special Requests BOTTLES DRAWN AEROBIC AND ANAEROBIC 5CC EACHA   Final   Culture  Setup Time     Final   Value: 09/09/2013 14:17     Performed at [facility]   Culture     Final   Value:        BLOOD CULTURE RECEIVED NO GROWTH TO DATE CULTURE WILL BE HELD FOR 5 DAYS BEFORE ISSUING A FINAL NEGATIVE REPORT     Performed at [facility]   Report Status PENDING   Incomplete  CULTURE, BLOOD (ROUTINE X 2)     Status: None   Collection Time    09/09/13  9:23 AM      Result Value Ref Range Status   Specimen Description BLOOD RIGHT HAND   Final   Special Requests BOTTLES DRAWN AEROBIC AND ANAEROBIC 5CC EACH   Final   Culture  Setup Time     Final   Value: 09/09/2013 14:16     Performed at [facility]   Culture     Final   Value:        BLOOD CULTURE RECEIVED NO GROWTH TO DATE CULTURE WILL BE HELD FOR 5 DAYS BEFORE ISSUING A FINAL NEGATIVE REPORT     Performed at [facility]   Report Status PENDING   Incomplete  MRSA PCR SCREENING     Status: None   Collection Time    09/09/13 11:58 AM      Result Value Ref Range Status   MRSA by PCR NEGATIVE  NEGATIVE Final   Comment:            The GeneXpert MRSA Assay (FDA     approved for NASAL specimens     only), is  one component of a     comprehensive MRSA colonization     surveillance program. It is not     intended to diagnose MRSA     infection nor to guide or     monitor treatment for     MRSA infections.  URINE CULTURE     Status:  None   Collection Time    09/10/13  3:20 AM      Result Value Ref Range Status   Specimen Description URINE, RANDOM   Final   Special Requests NONE   Final   Culture  Setup Time     Final   Value: 09/10/2013 09:26     Performed at [facility]     Final   Value: 6,000 COLONIES/ML     Performed at [facility]   Culture     Final   Value: INSIGNIFICANT GROWTH     Performed at [facility]   Report Status 09/11/2013 FINAL   Final     Scheduled Meds: . antiseptic oral rinse  15 mL Mouth Rinse q12n4p  . chlorhexidine  15 mL Mouth Rinse BID  . dexamethasone  4 mg Intravenous Q12H  . fentaNYL  100 mcg Transdermal Q72H  . fentaNYL  100 mcg Transdermal Q72H  . fentaNYL  100 mcg Transdermal Q72H  . insulin aspart  0-20 Units Subcutaneous 6 times per day  . scopolamine  1 patch Transdermal Q72H   Continuous Infusions: . sodium chloride 10 mL/hr at 09/14/13 0949  . Marland KitchenTPN (CLINIMIX-E) Adult 90 mL/hr at 09/14/13 1706   And  . fat emulsion 240 mL (09/15/13 0500)  . HYDROmorphone 1 mg/hr (09/15/13 0706)  . octreotide (SANDOSTATIN) infusion 50 mcg/hr (09/14/13 1800)  . pantoprozole (PROTONIX) infusion 8 mg/hr (09/14/13 1800)     Theodis Blaze, MD  [facility] Pager [phone]  If 7PM-7AM, please contact night-coverage [email or web address] Password [email or web address] 09/15/2013, 7:35 AM   LOS: 6 days

## 2013-09-15 NOTE — Progress Notes (Addendum)
Palliative Care Team at [facility] Note   SUBJECTIVE: Brandon [name] is calmer, his NG partially came out this AM and had to be advanced, some blood in canister probably from trauma. He got OOB yesterday and walked in the hall a short distance. His pain is higher than usual-he is asking for an increase in his Dilaudid infusion.  OBJECTIVE: Vital Signs: BP 159/96  Pulse 98  Temp(Src) 97.8 F (36.6 C) (Oral)  Resp 15  Ht 5\' 9"  (1.753 m)  Wt 75.8 kg (167 lb 1.7 oz)  BMI 24.67 kg/m2  SpO2 100%   Intake and Output: 06/05 0701 - 06/06 0700 In: 3329 [I.V.:957; TPN:2190] Out: 5188 [Urine:1150; Emesis/NG output:600; Stool:100]  Physical Exam: General: Chronically ill appearing, lethargic but irritable  Head: Normal, NG in nare without trauma  Lungs:  normal  Heart: Tachy  Abdomen:  Less distended, ostomy ok  Extremities: +edema    Allergies  Allergen Reactions  . Codeine Nausea And Vomiting  . Effexor [Venlafaxine Hcl] Other (See Comments)    Flat Feeling  . Imuran [Azathioprine] Nausea And Vomiting    Medications: Scheduled Meds:  . antiseptic oral rinse  15 mL Mouth Rinse q12n4p  . chlorhexidine  15 mL Mouth Rinse BID  . dexamethasone  4 mg Intravenous Q12H  . fentaNYL  100 mcg Transdermal Q72H  . fentaNYL  100 mcg Transdermal Q72H  . fentaNYL  100 mcg Transdermal Q72H  . insulin aspart  0-20 Units Subcutaneous 6 times per day  . scopolamine  1 patch Transdermal Q72H    Continuous Infusions: . sodium chloride 10 mL/hr at 09/14/13 0949  . Marland KitchenTPN (CLINIMIX-E) Adult 90 mL/hr at 09/14/13 1706   And  . fat emulsion 240 mL (09/15/13 0500)  . HYDROmorphone 6 mg/hr (09/15/13 1000)  . octreotide (SANDOSTATIN) infusion 50 mcg/hr (09/15/13 0932)  . pantoprozole (PROTONIX) infusion 8 mg/hr (09/14/13 1800)    PRN Meds: acetaminophen, chlorproMAZINE (THORAZINE) IV, diazepam, HYDROmorphone, phenol, promethazine   Labs: CBC    Component Value Date/Time   WBC 11.6*  09/15/2013 0500   RBC 3.23* 09/15/2013 0500   HGB 9.7* 09/15/2013 0500   HCT 28.5* 09/15/2013 0500   PLT 256 09/15/2013 0500   MCV 88.2 09/15/2013 0500   MCH 30.0 09/15/2013 0500   MCHC 34.0 09/15/2013 0500   RDW 17.4* 09/15/2013 0500   LYMPHSABS 0.5* 09/15/2013 0500   MONOABS 0.7 09/15/2013 0500   EOSABS 0.0 09/15/2013 0500   BASOSABS 0.0 09/15/2013 0500    CMET     Component Value Date/Time   NA 134* 09/15/2013 0500   K 4.2 09/15/2013 0500   CL 99 09/15/2013 0500   CO2 24 09/15/2013 0500   GLUCOSE 159* 09/15/2013 0500   BUN 21 09/15/2013 0500   CREATININE 0.69 09/15/2013 0500   CALCIUM 8.1* 09/15/2013 0500   PROT 5.0* 09/13/2013 0355   ALBUMIN 2.1* 09/13/2013 0355   AST 19 09/13/2013 0355   ALT 38 09/13/2013 0355   ALKPHOS 114 09/13/2013 0355   BILITOT 0.4 09/13/2013 0355   GFRNONAA >90 09/15/2013 0500   GFRAA >90 09/15/2013 0500    ASSESSMENT/ PLAN: 58 yo with metastatic rectal cancer, 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   -Increase his Dilaudid to max of 10 for comfort -Continue continuous octreotide infusion- at discharge will give him IM depot Octreotide LAR -High dose PPI infusion -Decradron-will decrease dose today -Scopalamine patch added for nausea and GI secretions  His goal is to go home-with hospice and avoid  hospitalization-he feels restricted here and has a very high level of mistrust for healthcare providers- but this is improving by being more flexible with his goals such as getting OOB and negotiating his NG.  His dad has been managing all of his care at home including TPN.  Maintain current treatment for now and monitor NG output.  Time In: 900 Time Out: 10AM Total Time Spent with Patient:  50 minutes Total Overall Time: 60 minutes   Greater than 50%  of this time was spent counseling and coordinating care related to the above assessment and plan.   Acquanetta Chain, DO  09/15/2013, 10:13 AM  Please contact Palliative Medicine Team phone at [phone] for questions and concerns.

## 2013-09-16 ENCOUNTER — Inpatient Hospital Stay (HOSPITAL_COMMUNITY): Payer: BC Managed Care – PPO

## 2013-09-16 LAB — GLUCOSE, CAPILLARY
GLUCOSE-CAPILLARY: 121 mg/dL — AB (ref 70–99)
GLUCOSE-CAPILLARY: 133 mg/dL — AB (ref 70–99)
Glucose-Capillary: 100 mg/dL — ABNORMAL HIGH (ref 70–99)
Glucose-Capillary: 152 mg/dL — ABNORMAL HIGH (ref 70–99)
Glucose-Capillary: 152 mg/dL — ABNORMAL HIGH (ref 70–99)
Glucose-Capillary: 157 mg/dL — ABNORMAL HIGH (ref 70–99)
Glucose-Capillary: 159 mg/dL — ABNORMAL HIGH (ref 70–99)

## 2013-09-16 LAB — CBC WITH DIFFERENTIAL/PLATELET
BASOS ABS: 0 10*3/uL (ref 0.0–0.1)
Basophils Relative: 0 % (ref 0–1)
EOS PCT: 0 % (ref 0–5)
Eosinophils Absolute: 0 10*3/uL (ref 0.0–0.7)
HCT: 30.9 % — ABNORMAL LOW (ref 39.0–52.0)
Hemoglobin: 10.4 g/dL — ABNORMAL LOW (ref 13.0–17.0)
LYMPHS PCT: 4 % — AB (ref 12–46)
Lymphs Abs: 0.6 10*3/uL — ABNORMAL LOW (ref 0.7–4.0)
MCH: 29.9 pg (ref 26.0–34.0)
MCHC: 33.7 g/dL (ref 30.0–36.0)
MCV: 88.8 fL (ref 78.0–100.0)
Monocytes Absolute: 1.1 10*3/uL — ABNORMAL HIGH (ref 0.1–1.0)
Monocytes Relative: 8 % (ref 3–12)
Neutro Abs: 12.8 10*3/uL — ABNORMAL HIGH (ref 1.7–7.7)
Neutrophils Relative %: 88 % — ABNORMAL HIGH (ref 43–77)
Platelets: 333 10*3/uL (ref 150–400)
RBC: 3.48 MIL/uL — ABNORMAL LOW (ref 4.22–5.81)
RDW: 17 % — AB (ref 11.5–15.5)
WBC: 14.6 10*3/uL — AB (ref 4.0–10.5)

## 2013-09-16 LAB — TYPE AND SCREEN
ABO/RH(D): O POS
ANTIBODY SCREEN: NEGATIVE
Unit division: 0

## 2013-09-16 LAB — HEMOGLOBIN AND HEMATOCRIT, BLOOD
HCT: 29 % — ABNORMAL LOW (ref 39.0–52.0)
HEMATOCRIT: 29.9 % — AB (ref 39.0–52.0)
Hemoglobin: 9.7 g/dL — ABNORMAL LOW (ref 13.0–17.0)
Hemoglobin: 9.8 g/dL — ABNORMAL LOW (ref 13.0–17.0)

## 2013-09-16 LAB — PHOSPHORUS: Phosphorus: 4.9 mg/dL — ABNORMAL HIGH (ref 2.3–4.6)

## 2013-09-16 LAB — MAGNESIUM: MAGNESIUM: 1.7 mg/dL (ref 1.5–2.5)

## 2013-09-16 MED ORDER — CEFAZOLIN SODIUM-DEXTROSE 2-3 GM-% IV SOLR: 2.0000 g | Freq: Once | INTRAVENOUS | Status: DC

## 2013-09-16 MED ORDER — TRACE MINERALS CR-CU-F-FE-I-MN-MO-SE-ZN IV SOLN
INTRAVENOUS | Status: AC
  Administered 2013-09-16: 17:00:00 via INTRAVENOUS
  Filled 2013-09-16: qty 3000

## 2013-09-16 MED ORDER — FAT EMULSION 20 % IV EMUL
240.0000 mL | INTRAVENOUS | Status: AC
  Administered 2013-09-16: 240 mL via INTRAVENOUS
  Filled 2013-09-16: qty 250

## 2013-09-16 NOTE — Progress Notes (Signed)
Patient ID: Brandon [name], male   DOB: June 27, 1955, 58 y.o.   MRN: [id number]  [facility] PROGRESS NOTE  DONA KLEMANN AYT:[id number] DOB: Sep 20, 1955 DOA: 09/09/2013 PCP: [name], MD  Brief narrative:  58 yo male with metastatic rectal cancer who is status post cycle 14 of 5-FU with last chemotherapy administered reportedly 1 week prior to admission. Presented with main concern of near syncope, hematemesis, and blood in ostomy bag. GI evaluated a patient with endoscopy and found duodenal ulcer most likely NSAID induced.   Active Problems:  Metastatic rectal carcinoma  Progressive based on CT scan, probably malignant obstruction with UGIB  NG placed 6/5,with sedation. GI fluid has cleared up and volume has decreased.  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  On TPN but this is also becoming difficult to manage in the setting of volume overload and very poor clinical response, progressive deterioration, electrolyte disturbance  IV access is also now becoming an issue in the setting of volume overload  Pt is very poor surgical candidate in the setting of progressive and invasive carcinoma  Palliative care team assistance is appreciated  IR to place PEG on Monday  Leukocytosis  No signs of active infectious etiology but will monitor closely   Repeat CBC in AM Anasarca  Secondary to fluids pt's is receiving, TP, transfusions, NS  Weight on admission 157 lbs and this AM 168 lbs --> 167 lbs --> 158 lbs this AM  Again, this was addressed with pt's  father as currently treatment options are very limited  Pt's goal is to go home with hospice  One dose of Lasix provided 6/6 and pt has repsonded well  Somnolence  From all the sedating medications pt is receiving: 3 fentanyl patches, in addition to inj, Dilaudid PCA, neurontin, benadryl, valium, percocet  More alert this AM and reports feeling better  Severe malnutrition  Secondary to progressive nature of the malignancy  On TPN  Plan for PEG placement Monday  Functional quadriplegia  Secondary to acute on chronic progressive illness, pt has been bed bound since admission  Pt able to sit up in bed this AM Tachycardia  LE doppler negative for DVT DVT prophylaxis  Pt refusing SCD's, can not give heparin or lovenox due to high risk bleed   Consultants:  GI  Palliative care team  Procedures/Studies:  Nm Gi Blood Loss 09/12/2013 ADDENDUM REPORT: The case was re- reviewed in conjunction with CT of the abdomen and pelvis which of was obtained subsequent to this exam. The activity which was previously felt well likely represent radiotracer accumulation within the patient's left ostomy is more likely to represent pooling of radiotracer activity within the patient's obstructed stomach. It is difficult to say, with a high degree of certainty the whether or not the gastric activity is free pertechnetate or extravasated radiotracer from an active gastric bleed GI bleed. However, given the lack of significant increase in intensity over time this is favored to represent a likely free pertechnetate.  09/12/2013 Examination is positive for active GI bleeding with radiotracer accumulation in the  patient's left abdominal ostomy. Source of bleeding is likely upper GI tract, either distal stomach or proximal duodenum.  Ct Angio Abd/pel W/ And/or W/o 09/12/2013  1. Interval worsening of ill-defined serpiginous retractile retroperitoneal soft mass which partially obstructs the bilateral ureters (now resulting moderate  bilateral pelvicaliectasis and ureterectasis) and marked narrowing of the duodenum (resulting in at least partial functional obstruction with upstream dilatation of the proximal duodenum and stomach).  2. 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  3. The celiac artery is retracted caudally secondary to the retroperitoneal fibrosis, though remains widely patent without hemodynamically significant narrowing. The celiac artery appears to be the dominant arterial supply to the intestines with mild hypertrophy of several pancreaticoduodenal arteries providing collateral supply to the SMA distribution. No evidence of pneumatosis or portal venous gas.  4. Post LAR with ill-defined soft tissue and fluid with the lower pelvis, presumably sequela of prior radiation change though locally recurrent disease is not excluded on the basis of this examination. Worsening retroperitoneal adenopathy worrisome for progression of provided history of rectal cancer. Correlation to prior outside examinations is recommended.  5. 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 No definable/drainable fluid collection within the abdomen or pelvis. 6. Suspected hepatic steatosis.  Antibiotics:  None   Code Status: DNR  Family Communication: Father at bedside  Disposition Plan: Transfer to med floor    HPI/Subjective: No events overnight.   Objective: Filed Vitals:   09/15/13 2000 09/16/13 0000 09/16/13 0353 09/16/13 0400  BP: 130/91 164/89    Pulse: 103 108    Temp: 98.3 F (36.8 C) 98.1 F (36.7 C)   97.8 F (36.6 C)  TempSrc: Oral Oral  Oral  Resp: 17 13    Height:      Weight:   71.7 kg (158 lb 1.1 oz)   SpO2: 100% 100%      Intake/Output Summary (Last 24 hours) at 09/16/13 0732 Last data filed at 09/16/13 0300  Gross per 24 hour  Intake   3270 ml  Output   5350 ml  Net  -2080 ml    Exam:   General:  Pt is alert, follows commands appropriately, not in acute distress, frail   Cardiovascular: Regular rate and rhythm, no rubs, no gallops  Respiratory: Clear to auscultation bilaterally, no wheezing, diminished breath sounds at bases   Abdomen: Soft, non tender, non distended, bowel sounds present, no guarding  Extremities: RLE +2 pitting edema, pulses DP and PT palpable bilaterally  Neuro: Grossly nonfocal  Data Reviewed: Basic Metabolic Panel:  Recent Labs Lab 09/11/13 0515 09/12/13 0430 09/13/13 0355 09/14/13 0500 09/15/13 0500 09/16/13 0505  NA 134* 132* 136* 134* 134*  --   K 4.1 3.7 3.6* 4.2 4.2  --   CL 98 98 102 98 99  --   CO2 27 24 25 24 24   --   GLUCOSE 176* 145* 156* 204* 159*  --   BUN 24* 21 21 20 21   --   CREATININE 0.75 0.75 0.74 0.73 0.69  --   CALCIUM 7.7* 7.5* 7.7* 8.1* 8.1*  --   MG 1.7  --  1.7 1.9 1.7 1.7  PHOS 3.6  --  3.7 3.9 3.7 4.9*   Liver Function Tests:  Recent Labs Lab 09/09/13 0845 09/10/13 0550 09/13/13 0355  AST 53* 15 19  ALT 88* 60* 38  ALKPHOS 135* 113 114  BILITOT 0.5 0.3 0.4  PROT 5.0* 5.1* 5.0*  ALBUMIN 2.2* 2.2* 2.1*   CBC:  Recent Labs Lab 09/11/13 1759 09/12/13 0430  09/13/13 0355  09/14/13 0500  09/14/13 1911 09/15/13 0500 09/15/13 1007 09/15/13 1816 09/16/13 0505  WBC 11.1* 11.4*  --  8.1  --  8.7  --   --  11.6*  --   --  14.6*  NEUTROABS 8.7*  --   --  6.5  --  7.9*  --   --  10.3*  --   --  12.8*  HGB 9.5* 9.2*  < > 8.4*  < > 9.7*  < > 9.6* 9.7* 9.6* 10.3* 10.4*  HCT 28.3* 27.4*  < > 25.4*  < > 28.4*  < > 29.3* 28.5* 28.1* 30.8* 30.9*  MCV 89.8 90.7  --  92.0  --  88.2  --   --  88.2   --   --  88.8  PLT 176 173  --  186  --  217  --   --  256  --   --  333  < > = values in this interval not displayed. Cardiac Enzymes:  Recent Labs Lab 09/12/13 1101 09/12/13 1725 09/12/13 2251  TROPONINI <0.30 <0.30 <0.30   BNP: No components found with this basename: POCBNP,  CBG:  Recent Labs Lab 09/15/13 1213 09/15/13 1631 09/15/13 2041 09/16/13 0022 09/16/13 0346  GLUCAP 139* 164* 123* 121* 152*    Recent Results (from the past 240 hour(s))  CULTURE, BLOOD (ROUTINE X 2)     Status: None   Collection Time    09/09/13  8:45 AM      Result Value Ref Range Status   Specimen Description BLOOD PICC LINE   Final   Special Requests BOTTLES DRAWN AEROBIC AND ANAEROBIC 5CC EACHA   Final   Culture  Setup Time     Final   Value: 09/09/2013 14:17     Performed at [facility]   Culture     Final   Value: NO GROWTH 5 DAYS     Performed at [facility]   Report Status 09/15/2013 FINAL   Final  CULTURE, BLOOD (ROUTINE X 2)     Status: None   Collection Time    09/09/13  9:23 AM      Result Value Ref Range Status   Specimen Description BLOOD RIGHT HAND   Final   Special Requests BOTTLES DRAWN AEROBIC AND ANAEROBIC [facility] EACH   Final   Culture  Setup Time     Final   Value: 09/09/2013 14:16     Performed at [facility]   Culture     Final   Value: NO GROWTH 5 DAYS     Performed at [facility]   Report Status 09/15/2013 FINAL   Final  MRSA PCR SCREENING     Status: None   Collection Time    09/09/13 11:58 AM      Result Value Ref Range Status   MRSA by PCR NEGATIVE  NEGATIVE Final   Comment:            The GeneXpert MRSA Assay (FDA     approved for NASAL specimens  only), is one component of a     comprehensive MRSA colonization     surveillance program. It is not     intended to diagnose MRSA     infection nor to guide or     monitor treatment for     MRSA infections.  URINE CULTURE     Status: None   Collection Time     09/10/13  3:20 AM      Result Value Ref Range Status   Specimen Description URINE, RANDOM   Final   Special Requests NONE   Final   Culture  Setup Time     Final   Value: 09/10/2013 09:26     Performed at [place]     Final   Value: 6,000 COLONIES/ML     Performed at [facility]   Culture     Final   Value: INSIGNIFICANT GROWTH     Performed at [facility]   Report Status 09/11/2013 FINAL   Final     Scheduled Meds: . antiseptic oral rinse  15 mL Mouth Rinse q12n4p  . chlorhexidine  15 mL Mouth Rinse BID  . dexamethasone  4 mg Intravenous Q12H  . fentaNYL  100 mcg Transdermal Q72H  . fentaNYL  100 mcg Transdermal Q72H  . fentaNYL  100 mcg Transdermal Q72H  . insulin aspart  0-20 Units Subcutaneous 6 times per day  . scopolamine  1 patch Transdermal Q72H   Continuous Infusions: . sodium chloride 10 mL/hr (09/15/13 2225)  . Marland KitchenTPN (CLINIMIX-E) Adult 90 mL/hr at 09/15/13 1708   And  . fat emulsion 240 mL (09/15/13 1709)  . HYDROmorphone 1 mg/hr (09/16/13 0702)  . octreotide (SANDOSTATIN) infusion 50 mcg/hr (09/16/13 0702)  . pantoprozole (PROTONIX) infusion 8 mg/hr (09/15/13 2225)     Theodis Blaze, MD  [facility] Pager [phone]  If 7PM-7AM, please contact night-coverage [email or web address] Password [email or web address] 09/16/2013, 7:32 AM   LOS: 7 days

## 2013-09-16 NOTE — Progress Notes (Signed)
PARENTERAL NUTRITION CONSULT NOTE - Follow Up  Pharmacy Consult for TPN Indication: Short bowel d/t rectal cancer  Allergies  Allergen Reactions  . Codeine Nausea And Vomiting  . Effexor [Venlafaxine Hcl] Other (See Comments)    Flat Feeling  . Imuran [Azathioprine] Nausea And Vomiting    Patient Measurements: Height: 5\' 9"  (175.3 cm) Weight: 158 lb 1.1 oz (71.7 kg) IBW/kg (Calculated) : 70.7  Vital Signs: Temp: 97.8 F (36.6 C) (06/07 0400) Temp src: Oral (06/07 0400) BP: 164/89 mmHg (06/07 0000) Pulse Rate: 108 (06/07 0000) Intake/Output from previous day: 06/06 0701 - 06/07 0700 In: 3270 [I.V.:1270; TPN:2000] Out: 5350 [Urine:4975; Emesis/NG output:350; Stool:25] Intake/Output from this shift:    Labs:  Recent Labs  09/14/13 0500  09/15/13 0500 09/15/13 1007 09/15/13 1816 09/16/13 0505  WBC 8.7  --  11.6*  --   --  14.6*  HGB 9.7*  < > 9.7* 9.6* 10.3* 10.4*  HCT 28.4*  < > 28.5* 28.1* 30.8* 30.9*  PLT 217  --  256  --   --  333  INR  --   --  0.98  --   --   --   < > = values in this interval not displayed.   Recent Labs  09/14/13 0500 09/15/13 0500 09/16/13 0505  NA 134* 134*  --   K 4.2 4.2  --   CL 98 99  --   CO2 24 24  --   GLUCOSE 204* 159*  --   BUN 20 21  --   CREATININE 0.73 0.69  --   CALCIUM 8.1* 8.1*  --   MG 1.9 1.7 1.7  PHOS 3.9 3.7 4.9*   Estimated Creatinine Clearance: 101.9 ml/min (by C-G formula based on Cr of 0.69).    Recent Labs  09/15/13 2041 09/16/13 0022 09/16/13 0346  GLUCAP 123* 121* 152*    Insulin Requirements in the past 24 hours:  10 units SSI 30 units insulin in TNA delivering 21.6 units/24h  Current Nutrition:  NPO Clinimix E 5/15 at a goal rate of 60ml/hr + 20% fat emulsion at 24ml/hr continuous infusions  IVF: NS at 10 ml/hr  Assessment: [name] w/ metastatic rectal cancer on TPN at home, admitted w/ syncope and vomiting blood. Also on an ambulatory Dilaudid PCA and getting IV chemotherapy at  home via [facility]. Managed by MDs at [facility] was to tx to [facility] when stabilized; however, pt continued to vomit significant amounts of blood and bleed from colostomy site which is being followed and treated by GI.  Recently was considering hospice. Pharmacy is asked to dose TPN while inpatient.    Glucose - CBGs better controlled on resistant SSI and 30 units insulin in TNA.  CBGs 121-152  Electrolytes - K+/Mag/Phos WNL.  Na 134, unable to adjust in TNA, Phos slight high to 4.9 this AM.  Other lytes ok on 6/6.  Renal - SCr WNL  LFTs - now WNL  TGs - 163 (6/1)  Prealbumin - 23.1 (6/1)  Palliative following.  On octreotide and PPI gtts.  Plan is for an IR consult for a decompressive PEG tube 6/8, if successful, pt to d/c with hospice   Nutritional Goals:   RD's Estimated Nutritional Needs (6/1): Kcal: 2300-2500, Protein: 107-120 grams, Fluid: >/=2.3 L/day   TPN PTA: Cyclic infusion (1X-9J), 2558ml delivered. Bag includes lipids one day a week. Daily avg: 1824Kcal and 96g protein. Standard MVI/TE. No insulin. Octreotide 320mcg/bag.  RD's recommendations this admission  are higher than what patient is receiving in home TNA.  Will adjust inpatient TNA to meet RD's goals.  Clinimix E 5/20 at a goal rate of 44ml/hr + 20% fat emulsion at 63ml/hr to provide: 108g/day protein, 2380Kcal/day.  Providing over continuous infusion over 24 hours for now (as opposed to cycling over 12 hours) due to acute illness and increased nutrition goals.  Patient is receiving octreotide gtt.  TPN Access: PICC TPN day#: 8 (inpatient), has been on TPN for 9 months per patient report  Plan: At 1800:  Continue Clinimix E 5/20 at 90 ml/hr.    20% fat emulsion at 28ml/hr.  Continue 10 units/L of regular insulin to TNA.  Continue SSI resistant scale q4h.   TNA to contain standard multivitamins and trace elements.  Continue IVF reduced to 10 ml/hr to account for volume provided by TNA  TNA lab panels on  Mondays & Thursdays  F/u daily.  Thank you for the consult.  Johny Drilling, PharmD, BCPS Pager: [phone] Pharmacy: [phone] 09/16/2013 7:26 AM

## 2013-09-17 ENCOUNTER — Inpatient Hospital Stay (HOSPITAL_COMMUNITY): Payer: BC Managed Care – PPO

## 2013-09-17 DIAGNOSIS — R601 Generalized edema: Secondary | ICD-10-CM | POA: Diagnosis present

## 2013-09-17 DIAGNOSIS — R609 Edema, unspecified: Secondary | ICD-10-CM

## 2013-09-17 DIAGNOSIS — R532 Functional quadriplegia: Secondary | ICD-10-CM

## 2013-09-17 DIAGNOSIS — E43 Unspecified severe protein-calorie malnutrition: Secondary | ICD-10-CM | POA: Diagnosis present

## 2013-09-17 DIAGNOSIS — K56609 Unspecified intestinal obstruction, unspecified as to partial versus complete obstruction: Secondary | ICD-10-CM | POA: Diagnosis present

## 2013-09-17 DIAGNOSIS — K269 Duodenal ulcer, unspecified as acute or chronic, without hemorrhage or perforation: Secondary | ICD-10-CM | POA: Diagnosis present

## 2013-09-17 DIAGNOSIS — R739 Hyperglycemia, unspecified: Secondary | ICD-10-CM | POA: Diagnosis present

## 2013-09-17 DIAGNOSIS — G893 Neoplasm related pain (acute) (chronic): Secondary | ICD-10-CM | POA: Diagnosis present

## 2013-09-17 LAB — HEMOGLOBIN AND HEMATOCRIT, BLOOD
HCT: 28.6 % — ABNORMAL LOW (ref 39.0–52.0)
HCT: 30.5 % — ABNORMAL LOW (ref 39.0–52.0)
HEMATOCRIT: 29.4 % — AB (ref 39.0–52.0)
HEMOGLOBIN: 9.4 g/dL — AB (ref 13.0–17.0)
Hemoglobin: 9.4 g/dL — ABNORMAL LOW (ref 13.0–17.0)
Hemoglobin: 9.4 g/dL — ABNORMAL LOW (ref 13.0–17.0)

## 2013-09-17 LAB — CBC WITH DIFFERENTIAL/PLATELET
BASOS ABS: 0 10*3/uL (ref 0.0–0.1)
BASOS PCT: 0 % (ref 0–1)
Eosinophils Absolute: 0 10*3/uL (ref 0.0–0.7)
Eosinophils Relative: 0 % (ref 0–5)
HCT: 29.6 % — ABNORMAL LOW (ref 39.0–52.0)
Hemoglobin: 9.5 g/dL — ABNORMAL LOW (ref 13.0–17.0)
Lymphocytes Relative: 4 % — ABNORMAL LOW (ref 12–46)
Lymphs Abs: 0.6 10*3/uL — ABNORMAL LOW (ref 0.7–4.0)
MCH: 29.2 pg (ref 26.0–34.0)
MCHC: 32.1 g/dL (ref 30.0–36.0)
MCV: 91.1 fL (ref 78.0–100.0)
Monocytes Absolute: 0.9 10*3/uL (ref 0.1–1.0)
Monocytes Relative: 6 % (ref 3–12)
Neutro Abs: 12.7 10*3/uL — ABNORMAL HIGH (ref 1.7–7.7)
Neutrophils Relative %: 90 % — ABNORMAL HIGH (ref 43–77)
PLATELETS: 306 10*3/uL (ref 150–400)
RBC: 3.25 MIL/uL — ABNORMAL LOW (ref 4.22–5.81)
RDW: 16.6 % — ABNORMAL HIGH (ref 11.5–15.5)
WBC: 14.2 10*3/uL — AB (ref 4.0–10.5)

## 2013-09-17 LAB — COMPREHENSIVE METABOLIC PANEL
ALT: 63 U/L — ABNORMAL HIGH (ref 0–53)
AST: 25 U/L (ref 0–37)
Albumin: 2.2 g/dL — ABNORMAL LOW (ref 3.5–5.2)
Alkaline Phosphatase: 164 U/L — ABNORMAL HIGH (ref 39–117)
BILIRUBIN TOTAL: 0.4 mg/dL (ref 0.3–1.2)
BUN: 25 mg/dL — AB (ref 6–23)
CALCIUM: 8.1 mg/dL — AB (ref 8.4–10.5)
CHLORIDE: 97 meq/L (ref 96–112)
CO2: 28 meq/L (ref 19–32)
Creatinine, Ser: 0.76 mg/dL (ref 0.50–1.35)
GFR calc non Af Amer: >90 mL/min (ref >90)
Glucose, Bld: 197 mg/dL — ABNORMAL HIGH (ref 70–99)
Potassium: 4.3 mEq/L (ref 3.7–5.3)
Sodium: 133 mEq/L — ABNORMAL LOW (ref 137–147)
Total Protein: 5.6 g/dL — ABNORMAL LOW (ref 6.0–8.3)

## 2013-09-17 LAB — PREALBUMIN: PREALBUMIN: 24.8 mg/dL (ref 17.0–34.0)

## 2013-09-17 LAB — GLUCOSE, CAPILLARY
GLUCOSE-CAPILLARY: 191 mg/dL — AB (ref 70–99)
GLUCOSE-CAPILLARY: 152 mg/dL — AB (ref 70–99)
Glucose-Capillary: 110 mg/dL — ABNORMAL HIGH (ref 70–99)
Glucose-Capillary: 121 mg/dL — ABNORMAL HIGH (ref 70–99)
Glucose-Capillary: 139 mg/dL — ABNORMAL HIGH (ref 70–99)
Glucose-Capillary: 207 mg/dL — ABNORMAL HIGH (ref 70–99)

## 2013-09-17 LAB — TRIGLYCERIDES: TRIGLYCERIDES: 61 mg/dL (ref <150)

## 2013-09-17 LAB — MAGNESIUM: MAGNESIUM: 1.7 mg/dL (ref 1.5–2.5)

## 2013-09-17 LAB — PHOSPHORUS: Phosphorus: 4.2 mg/dL (ref 2.3–4.6)

## 2013-09-17 MED ORDER — FAT EMULSION 20 % IV EMUL
240.0000 mL | INTRAVENOUS | Status: AC
  Administered 2013-09-17: 240 mL via INTRAVENOUS
  Filled 2013-09-17: qty 250

## 2013-09-17 MED ORDER — DEXAMETHASONE SODIUM PHOSPHATE 4 MG/ML IJ SOLN
2.0000 mg | Freq: Two times a day (BID) | INTRAMUSCULAR | Status: DC
  Administered 2013-09-17 – 2013-09-18 (×2): 2 mg via INTRAVENOUS
  Filled 2013-09-17 (×3): qty 0.5

## 2013-09-17 MED ORDER — TRACE MINERALS CR-CU-F-FE-I-MN-MO-SE-ZN IV SOLN
INTRAVENOUS | Status: AC
  Administered 2013-09-17: 17:00:00 via INTRAVENOUS
  Filled 2013-09-17: qty 3000

## 2013-09-17 MED ORDER — IOHEXOL 300 MG/ML  SOLN
50.0000 mL | Freq: Once | INTRAMUSCULAR | Status: AC | PRN
  Administered 2013-09-17: 20 mL

## 2013-09-17 MED ORDER — IOHEXOL 300 MG/ML  SOLN: 10.0000 mL | Freq: Once | INTRAMUSCULAR | Status: AC | PRN

## 2013-09-17 MED ORDER — FENTANYL CITRATE 0.05 MG/ML IJ SOLN
INTRAMUSCULAR | Status: AC | PRN
  Administered 2013-09-17: 100 ug via INTRAVENOUS

## 2013-09-17 MED ORDER — CEFAZOLIN SODIUM-DEXTROSE 2-3 GM-% IV SOLR
2.0000 g | INTRAVENOUS | Status: AC
  Administered 2013-09-17: 2 g via INTRAVENOUS
  Filled 2013-09-17: qty 50

## 2013-09-17 MED ORDER — MIDAZOLAM HCL 2 MG/2ML IJ SOLN
INTRAMUSCULAR | Status: AC | PRN
  Administered 2013-09-17: 2 mg via INTRAVENOUS

## 2013-09-17 MED ORDER — GLUCAGON HCL RDNA (DIAGNOSTIC) 1 MG IJ SOLR
INTRAMUSCULAR | Status: AC
  Filled 2013-09-17: qty 1

## 2013-09-17 MED ORDER — FENTANYL CITRATE 0.05 MG/ML IJ SOLN
INTRAMUSCULAR | Status: AC
  Filled 2013-09-17: qty 4

## 2013-09-17 MED ORDER — PANTOPRAZOLE SODIUM 40 MG IV SOLR
40.0000 mg | Freq: Two times a day (BID) | INTRAVENOUS | Status: DC
  Administered 2013-09-17 – 2013-09-19 (×5): 40 mg via INTRAVENOUS
  Filled 2013-09-17 (×7): qty 40

## 2013-09-17 MED ORDER — MIDAZOLAM HCL 2 MG/2ML IJ SOLN
INTRAMUSCULAR | Status: AC
  Filled 2013-09-17: qty 4

## 2013-09-17 MED ORDER — LIDOCAINE HCL 1 % IJ SOLN
INTRAMUSCULAR | Status: AC
  Filled 2013-09-17: qty 20

## 2013-09-17 MED ORDER — GLUCAGON HCL RDNA (DIAGNOSTIC) 1 MG IJ SOLR
1.0000 mg | Freq: Once | INTRAMUSCULAR | Status: AC | PRN
  Administered 2013-09-17: 1 mg via INTRAVENOUS

## 2013-09-17 NOTE — Progress Notes (Addendum)
PARENTERAL NUTRITION CONSULT NOTE - Follow Up  Pharmacy Consult for TPN Indication: Short bowel d/t rectal cancer  Allergies  Allergen Reactions  . Codeine Nausea And Vomiting  . Effexor [Venlafaxine Hcl] Other (See Comments)    Flat Feeling  . Imuran [Azathioprine] Nausea And Vomiting    Patient Measurements: Height: 5\' 9"  (175.3 cm) Weight: 159 lb 2.8 oz (72.2 kg) IBW/kg (Calculated) : 70.7  Vital Signs: Temp: 97.8 F (36.6 C) (06/08 0450) Temp src: Oral (06/08 0450) BP: 146/84 mmHg (06/08 0450) Pulse Rate: 102 (06/08 0450) Intake/Output from previous day: 06/07 0701 - 06/08 0700 In: 2235.8 [I.V.:1535.8; TPN:700] Out: 2175 [Urine:2075; Emesis/NG output:100] Intake/Output from this shift:    Labs:  Recent Labs  09/15/13 0500  09/16/13 0505  09/16/13 1725 09/17/13 0245 09/17/13 0458  WBC 11.6*  --  14.6*  --   --   --  14.2*  HGB 9.7*  < > 10.4*  < > 9.8* 9.4* 9.5*  HCT 28.5*  < > 30.9*  < > 29.9* 29.4* 29.6*  PLT 256  --  333  --   --   --  306  INR 0.98  --   --   --   --   --   --   < > = values in this interval not displayed.   Recent Labs  09/15/13 0500 09/16/13 0505 09/17/13 0458  NA 134*  --  133*  K 4.2  --  4.3  CL 99  --  97  CO2 24  --  28  GLUCOSE 159*  --  197*  BUN 21  --  25*  CREATININE 0.69  --  0.76  CALCIUM 8.1*  --  8.1*  MG 1.7 1.7 1.7  PHOS 3.7 4.9* 4.2  PROT  --   --  5.6*  ALBUMIN  --   --  2.2*  AST  --   --  25  ALT  --   --  63*  ALKPHOS  --   --  164*  BILITOT  --   --  0.4  TRIG  --   --  61   Estimated Creatinine Clearance: 101.9 ml/min (by C-G formula based on Cr of 0.76).    Recent Labs  09/16/13 2350 09/17/13 0446 09/17/13 0836  GLUCAP 100* 191* 139*    Insulin Requirements in the past 24 hours:  19 units SSI 30 units insulin in TNA delivering 21.6 units/24h  Current Nutrition:  NPO Clinimix E 5/15 at a goal rate of 33ml/hr + 20% fat emulsion at 28ml/hr continuous infusions  IVF: NS at 10  ml/hr  Assessment: [name] w/ metastatic rectal cancer on TPN at home, admitted w/ syncope and vomiting blood. Also on an ambulatory Dilaudid PCA and getting IV chemotherapy at home via [facility]. Managed by MDs at [facility] was to tx to [facility] when stabilized; however, pt continued to vomit significant amounts of blood and bleed from colostomy site which is being followed and treated by GI.  Recently was considering hospice. Pharmacy is asked to dose TPN while inpatient.    Glucose -On resistant SSI and 30 units insulin in TNA.  CBGs 100-197  Electrolytes - K+/Mag/Phos WNL.  Na 133, unable to adjust in TNA. Corrected calcium WNL at 9.54.  Renal - SCr WNL  LFTs - ALT and alk phos slightly elevated  TGs - 61 (6/8)   Prealbumin - 24.8  Palliative following.  On octreotide and PPI gtts.  Plan  is for an IR consult for a decompressive PEG tube today, if successful, pt to d/c with hospice   Nutritional Goals:   RD's Estimated Nutritional Needs (6/1): Kcal: 2300-2500, Protein: 107-120 grams, Fluid: >/=2.3 L/day   TPN PTA: Cyclic infusion (7B-9T), 2511ml delivered. Bag includes lipids one day a week. Daily avg: 1824Kcal and 96g protein. Standard MVI/TE. No insulin. Octreotide 375mcg/bag.  RD's recommendations this admission are higher than what patient is receiving in home TNA.  Will adjust inpatient TNA to meet RD's goals.  Clinimix E 5/20 at a goal rate of 75ml/hr + 20% fat emulsion at 15ml/hr to provide: 108g/day protein, 2380Kcal/day.  Providing over continuous infusion over 24 hours for now (as opposed to cycling over 12 hours) due to acute illness and increased nutrition goals.  Patient is receiving octreotide gtt.  TPN Access: PICC TPN day#: 9 (inpatient), has been on TPN for 9 months per patient report  Plan: At 1800:  Continue Clinimix E 5/20 at 90 ml/hr.    20% fat emulsion at 63ml/hr.  Continue 10 units/L of regular insulin to TNA.  Continue SSI resistant scale q4h.   TNA  to contain standard multivitamins and trace elements.  Continue IVF reduced to 10 ml/hr to account for volume provided by TNA  TNA lab panels on Mondays & Thursdays  CMET tomorrow for close follow up of liver enzymes  Thank you for the consult.  Dicky Doe, PharmD, BCPS Clinical Pharmacist Pager: [phone] 09/17/2013 10:03 AM

## 2013-09-17 NOTE — Progress Notes (Signed)
NUTRITION FOLLOW UP  Intervention:   TPN per PharmD  RD to continue to follow nutrition care plan    Nutrition Dx:   Increased nutrient needs related to metastatic rectal cancer as evidenced by estimated needs.     Goal:   TPN to meet >/=90% of estimated nutrition needs    Monitor:   TPN tolerance, weight trend, labs    Assessment:   6/01: -Pt has been on home TPN for 9 months, per pt report  -Pt cycles TPN over 12 hours at home, but unsure of the rate  -Pt undergoing chemotherapy, last treatment was last week  -Pt reports that he has recently gained weight. Suspect some of this may be due to fluid accumulation  -Pt reports that he used to drink coke, sprite, and water PO, but recently has not been thirsty and has not been drinking anything   6/08: -Per RN notes, pt's NGT was displaced on 6/06 and successfully able to replaced. Currently on suction. Venting PEG placed today for malignant obstruction and gastric decompression per MD note -Pharmacy increased TPN rate to meet increased estimated nutrition needs. Currently at goal rate, with IVF reduced to 10 ml/hr for volume  -Clinimix E 5/20 at goal rate of 90 ml/hr at 20% fat emulsion at 10 ml/hr provides 2380 kcal (100% est kcal needs) and 108 gram protein (100% est protein needs) -Phos/K/Mg WNL -Elevated CBG's being managed by 10 units/L of regular insulin to TNA w/SSI resistant scale q4h. -Weight increased 10 lbs over 8 days, likely d/t fluid retention as pt with anasarca  Height: Ht Readings from Last 1 Encounters:  09/16/13 5\' 9"  (1.753 m)    Weight Status:   Wt Readings from Last 1 Encounters:  09/17/13 164 lb 7.4 oz (74.6 kg)  09/09/13 154 lbs  Re-estimated needs:   Kcal: 2300 - 2500  Protein: 107 - 120 grams  Fluid: >/= 2.3 L/day   Skin: +3 RLE edema, and +2 LLE edema  Diet Order: NPO   Intake/Output Summary (Last 24 hours) at 09/17/13 1631 Last data filed at 09/17/13 1512  Gross per 24 hour  Intake  2784.83 ml  Output   2525 ml  Net 259.83 ml    Last BM: 6/07   Labs:   Recent Labs Lab 09/14/13 0500 09/15/13 0500 09/16/13 0505 09/17/13 0458  NA 134* 134*  --  133*  K 4.2 4.2  --  4.3  CL 98 99  --  97  CO2 24 24  --  28  BUN 20 21  --  25*  CREATININE 0.73 0.69  --  0.76  CALCIUM 8.1* 8.1*  --  8.1*  MG 1.9 1.7 1.7 1.7  PHOS 3.9 3.7 4.9* 4.2  GLUCOSE 204* 159*  --  197*    CBG (last 3)   Recent Labs  09/17/13 0836 09/17/13 1156 09/17/13 1350  GLUCAP 139* 121* 207*    Scheduled Meds: . antiseptic oral rinse  15 mL Mouth Rinse q12n4p  . chlorhexidine  15 mL Mouth Rinse BID  . dexamethasone  2 mg Intravenous Q12H  . fentaNYL  100 mcg Transdermal Q72H  . fentaNYL  100 mcg Transdermal Q72H  . fentaNYL  100 mcg Transdermal Q72H  . fentaNYL      . glucagon (human recombinant)      . insulin aspart  0-20 Units Subcutaneous 6 times per day  . lidocaine      . midazolam      .  pantoprazole (PROTONIX) IV  40 mg Intravenous Q12H  . scopolamine  1 patch Transdermal Q72H    Continuous Infusions: . sodium chloride 10 mL/hr (09/15/13 2225)  . Marland KitchenTPN (CLINIMIX-E) Adult 90 mL/hr at 09/16/13 1728   And  . fat emulsion 240 mL (09/16/13 1728)  . Marland KitchenTPN (CLINIMIX-E) Adult     And  . fat emulsion    . HYDROmorphone 7 mg/hr (09/17/13 0928)  . octreotide (SANDOSTATIN) infusion 50 mcg/hr (09/17/13 1607)    Atlee Abide Wailea LDN Clinical Dietitian [phone]

## 2013-09-17 NOTE — Progress Notes (Signed)
Progress Note   Brandon [name] CXK:[id number] DOB: [birth year] DOA: 09/09/2013 PCP: [name], MD   Brief Narrative:   Brandon [name] is an 58 y.o. male with metastatic rectal cancer who is status post cycle 14 of 5-FU with last chemotherapy administered reportedly 1 week prior to admission who was admitted on 09/09/13 with GI bleeding in the setting of heavy NSAID use. GI evaluated a patient with endoscopy and found duodenal ulcer most likely NSAID induced.   Assessment/Plan:   Principal Problem: GI bleed/acute blood loss anemia secondary to duodenal ulcer/prepyloric ulcer complicated by intestinal obstruction in the setting of metastatic rectal cancer with refractory nausea and vomiting  GI evaluated the patient with EGD on 09/10/13, findings showed a large duodenal ulcer, a moderate sized prepyloric ulcer as well as stigmata of hemorrhage (likely NSAID-related). Recommendations were to continue high-dose PPI therapy with when necessary blood transfusion.  Hemoglobin remains stable status post a total of 4 units of PRBCs. Transfuse as needed for hemoglobin less than 8.  NG tube placed 09/14/13 secondary to intestinal structure in (functional) of the second or third portion of the duodenum.  Decompressive PEG tube placement planned for today.  Continue octreotide drip.  Active Problems: Hyperglycemia  Likely related to TNA. Continue SSI.  Functional quadriplegia  Secondary to advanced malignancy. Bedbound since admission.  Activity as tolerated.  Severe protein calorie malnutrition  Remains on TNA.  Anasarca  Patient received one dose of Lasix on 09/15/13 with improvement.  Cancer related pain  Continue aggressive pain management.  Continue Decadron.  Rectal cancer / Metastatic carcinoma  The patient follows at [facility], Dr. Marcell Anger (oncology) at [facility] [phone].    Progressive based on CT scan, 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    Palliative care following patient to assist with goals of care and symptom management.  Prognosis poor.  Goal is to go home with hospice.  DVT Prophylaxis  Refuses SCDs. Heparin contraindicated secondary to high-risk of GI bleeding.  Code Status: DNR. Family Communication: Father at bedside. Disposition Plan: Home when stable.   IV Access:    PICC   Procedures:    Right lower extremity venous duplex 09/13/13: No evidence of DVT, superficial thrombosis or Baker cyst.   Medical Consultants:    Dr. Drema Balzarine, Palliative Care.  Dr. Teena Irani, Gastroenterology.   Other Consultants:    Dietitian   Anti-Infectives:    None.  Subjective:   Brandon [name] denies current problems with pain, dyspnea, cough, diarrhea. He is sitting up and is awake but has myoclonic jerking.  Objective:    Filed Vitals:   09/16/13 1400 09/16/13 1425 09/16/13 2018 09/17/13 0450  BP:  147/92 145/94 146/84  Pulse: 116 108 105 102  Temp:  97.5 F (36.4 C) 97.8 F (36.6 C) 97.8 F (36.6 C)  TempSrc:  Oral Oral Oral  Resp: 11 14 14 16   Height:  5\' 9"  (1.753 m)    Weight:  72.2 kg (159 lb 2.8 oz)    SpO2: 100% 99% 99% 99%    Intake/Output Summary (Last 24 hours) at 09/17/13 0737 Last data filed at 09/17/13 0600  Gross per 24 hour  Intake 2233.83 ml  Output   2175 ml  Net  58.83 ml    Exam: Gen:  NAD, myoclonic jerking noted HEENT: NG tube draining dark colored drainage Cardiovascular:  Tachycardic, No M/R/G Respiratory:  Lungs diminished in the bases Gastrointestinal:  Abdomen soft, NT/ND, + BS  Extremities:  2+ edema   Data Reviewed:    Labs: Basic Metabolic Panel:  Recent Labs Lab 09/12/13 0430 09/13/13 0355 09/14/13 0500 09/15/13 0500 09/16/13 0505 09/17/13 0458  NA 132* 136* 134* 134*  --  133*  K 3.7 3.6* 4.2 4.2  --  4.3  CL 98 102 98 99  --  97  CO2 24 25 24 24   --  28  GLUCOSE 145* 156* 204* 159*  --  197*  BUN 21 21 20 21   --  25*   CREATININE 0.75 0.74 0.73 0.69  --  0.76  CALCIUM 7.5* 7.7* 8.1* 8.1*  --  8.1*  MG  --  1.7 1.9 1.7 1.7 1.7  PHOS  --  3.7 3.9 3.7 4.9* 4.2   GFR Estimated Creatinine Clearance: 101.9 ml/min (by C-G formula based on Cr of 0.76). Liver Function Tests:  Recent Labs Lab 09/13/13 0355 09/17/13 0458  AST 19 25  ALT 38 63*  ALKPHOS 114 164*  BILITOT 0.4 0.4  PROT 5.0* 5.6*  ALBUMIN 2.1* 2.2*   Coagulation profile  Recent Labs Lab 09/15/13 0500  INR 0.98    CBC:  Recent Labs Lab 09/13/13 0355  09/14/13 0500  09/15/13 0500  09/16/13 0505 09/16/13 1028 09/16/13 1725 09/17/13 0245 09/17/13 0458  WBC 8.1  --  8.7  --  11.6*  --  14.6*  --   --   --  14.2*  NEUTROABS 6.5  --  7.9*  --  10.3*  --  12.8*  --   --   --  12.7*  HGB 8.4*  < > 9.7*  < > 9.7*  < > 10.4* 9.7* 9.8* 9.4* 9.5*  HCT 25.4*  < > 28.4*  < > 28.5*  < > 30.9* 29.0* 29.9* 29.4* 29.6*  MCV 92.0  --  88.2  --  88.2  --  88.8  --   --   --  91.1  PLT 186  --  217  --  256  --  333  --   --   --  306  < > = values in this interval not displayed. Cardiac Enzymes:  Recent Labs Lab 09/12/13 1101 09/12/13 1725 09/12/13 2251  TROPONINI <0.30 <0.30 <0.30   CBG:  Recent Labs Lab 09/16/13 1224 09/16/13 1549 09/16/13 1945 09/16/13 2350 09/17/13 0446  GLUCAP 159* 157* 133* 100* 191*   Lipid Profile:  Recent Labs  09/17/13 0458  TRIG 61   Sepsis Labs:  Recent Labs Lab 09/14/13 0500 09/15/13 0500 09/16/13 0505 09/17/13 0458  WBC 8.7 11.6* 14.6* 14.2*   Microbiology Recent Results (from the past 240 hour(s))  CULTURE, BLOOD (ROUTINE X 2)     Status: None   Collection Time    09/09/13  8:45 AM      Result Value Ref Range Status   Specimen Description BLOOD PICC LINE   Final   Special Requests BOTTLES DRAWN AEROBIC AND ANAEROBIC 5CC EACHA   Final   Culture  Setup Time     Final   Value: 09/09/2013 14:17     Performed at [facility]   Culture     Final   Value: NO GROWTH 5  DAYS     Performed at [facility]   Report Status 09/15/2013 FINAL   Final  CULTURE, BLOOD (ROUTINE X 2)     Status: None   Collection Time    09/09/13  9:23 AM  Result Value Ref Range Status   Specimen Description BLOOD RIGHT HAND   Final   Special Requests BOTTLES DRAWN AEROBIC AND ANAEROBIC [facility] EACH   Final   Culture  Setup Time     Final   Value: 09/09/2013 14:16     Performed at [facility]   Culture     Final   Value: NO GROWTH 5 DAYS     Performed at [facility]   Report Status 09/15/2013 FINAL   Final  MRSA PCR SCREENING     Status: None   Collection Time    09/09/13 11:58 AM      Result Value Ref Range Status   MRSA by PCR NEGATIVE  NEGATIVE Final   Comment:            The GeneXpert MRSA Assay (FDA     approved for NASAL specimens     only), is one component of a     comprehensive MRSA colonization     surveillance program. It is not     intended to diagnose MRSA     infection nor to guide or     monitor treatment for     MRSA infections.  URINE CULTURE     Status: None   Collection Time    09/10/13  3:20 AM      Result Value Ref Range Status   Specimen Description URINE, RANDOM   Final   Special Requests NONE   Final   Culture  Setup Time     Final   Value: 09/10/2013 09:26     Performed at [facility]     Final   Value: 6,000 COLONIES/ML     Performed at [facility]   Culture     Final   Value: INSIGNIFICANT GROWTH     Performed at [facility]   Report Status 09/11/2013 FINAL   Final     Radiographs/Studies:   Dg Abd 1 View  09/15/2013   CLINICAL DATA:  Replaced NG tube  EXAM: ABDOMEN - 1 VIEW  COMPARISON:  CT abdomen pelvis dated 09/12/2013  FINDINGS: Enteric tube terminates in the distal esophagus with the side port in the mid esophagus. Advanced approximately 12 cm is suggested.  Nonobstructive bowel gas pattern.  Cholecystectomy clips.  Mild degenerative changes of the thoracic  spine.  IMPRESSION: Enteric tube terminates in the distal esophagus with the side port in the mid esophagus. Advanced approximately 12 cm is suggested.   Electronically Signed   By: Julian Hy M.D.   On: 09/15/2013 08:21   Nm Gi Blood Loss  09/12/2013   ADDENDUM REPORT: 09/12/2013 17:26  ADDENDUM: The case was re- reviewed in conjunction with CT of the abdomen and pelvis which of was obtained subsequent to this exam. The activity which was previously felt well likely represent radiotracer accumulation within the patient's left ostomy is more likely to represent pooling of radiotracer activity within the patient's obstructed stomach. It is difficult to say, with a high degree of certainty the whether or not the gastric activity is free pertechnetate or extravasated radiotracer from an active gastric bleed GI bleed. However, given the lack of significant increase in intensity over time this is favored to represent a likely free pertechnetate.   Electronically Signed   By: Kerby Moors M.D.   On: 09/12/2013 17:26   09/12/2013   CLINICAL DATA:  GI bleeding  EXAM: NUCLEAR MEDICINE GASTROINTESTINAL BLEEDING  SCAN  TECHNIQUE: Sequential abdominal images were obtained following intravenous administration of Tc-61m labeled red blood cells.  RADIOPHARMACEUTICALS:  26.7 mCi Tc-45m in-vitro labeled red cells.  COMPARISON:  CTA 09/12/2013  FINDINGS: Dynamically acquired sequential images of the abdomen and pelvis show a radiotracer accumulation within patient's left sided ostomy bag. The radiotracer is felt a likely originate from a proximal/ upper GI source, presumably distal stomach or proximal duodenum. Physiologic tracer activity is seen within the liver, spleen and urinary bladder as well as blood pool.  IMPRESSION: 1. Examination is positive for active GI bleeding with radiotracer accumulation in the patient's left abdominal ostomy. Source of bleeding is likely upper GI tract, either distal stomach or proximal  duodenum.  Electronically Signed: By: Kerby Moors M.D. On: 09/12/2013 17:10   Dg Chest Port 1 View  09/09/2013   CLINICAL DATA:  Weakness and nausea  EXAM: PORTABLE CHEST - 1 VIEW  COMPARISON:  Length 10/21/2007  FINDINGS: Dual lumen catheter is now seen on the right with the tip at the cavoatrial junction. The cardiac shadow is within normal limits. The lungs are clear. No bony abnormality is noted.  IMPRESSION: No active disease.   Electronically Signed   By: Inez Catalina M.D.   On: 09/09/2013 09:04   Dg Abd Portable 1v  09/16/2013   CLINICAL DATA:  NG tube placement.  EXAM: PORTABLE ABDOMEN - 1 VIEW  COMPARISON:  09/15/2013  FINDINGS: Nasogastric tube is again seen with slightly bigger loop over the stomach with tip over the left upper quadrant in the region of the gastric cardia unchanged. Visualized bowel gas pattern is nonobstructive. Right IJ central venous catheter is unchanged with tip over the SVC. Remainder the exam is unchanged.  IMPRESSION: Nonobstructive bowel gas pattern.  Nasogastric tube with slightly bigger loop over the stomach and tip unchanged over the medial gastric cardia.   Electronically Signed   By: Marin Olp M.D.   On: 09/16/2013 09:42   Dg Abd Portable 1v  09/15/2013   CLINICAL DATA:  NG tube adjustment  EXAM: PORTABLE ABDOMEN - 1 VIEW  COMPARISON:  09/15/2013 at 0731 hr  FINDINGS: Enteric tube terminates in the gastric cardia.  Cholecystectomy clips.  Central venous catheter terminates at the cavoatrial junction.  Mild patchy left basilar opacity, likely atelectasis.  IMPRESSION: Enteric tube terminates in the gastric cardia.   Electronically Signed   By: Julian Hy M.D.   On: 09/15/2013 09:32   Ct Angio Abd/pel W/ And/or W/o  09/12/2013   CLINICAL DATA:  History of metastatic colon cancer (2009), post chemotherapy, post ileostomy now with nausea and vomiting and duodenal ulcer. Evaluate for source of bleed given undefined anatomy post ostomy revision.  EXAM: CT  ANGIOGRAPHY ABDOMEN AND PELVIS WITH CONTRAST AND WITHOUT CONTRAST  TECHNIQUE: Multidetector CT imaging of the abdomen and pelvis was performed using the standard protocol during bolus administration of intravenous contrast. Multiplanar reconstructed images including MIPs were obtained and reviewed to evaluate the vascular anatomy.  CONTRAST:  11mL OMNIPAQUE IOHEXOL 350 MG/ML SOLN  COMPARISON:  CT of the abdomen pelvis - 09/12/2012  FINDINGS: Vascular Findings:  Abdominal aorta: Normal caliber of the abdominal aorta. No abdominal aortic dissection. Ill-defined sclerosing soft tissue within the lower abdomen/ upper pelvis abuts the anterior wall of the abdominal aorta though does not result in a hemodynamically significant stenosis.  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 While the cranial aspect  of the retroperitoneal fibrosis abuts the caudal surface of the celiac artery, it does not result in a hemodynamically significant stenosis or vessel narrowing. Conventional branching pattern. There is mild hypertrophy of multiple pancreaticoduodenal arterial collaterals, providing additional arterial supply to the SMA.  SMA: The retroperitoneal fibrosis circumferentially surrounds and narrows the SMA throughout its course (image 62, series 5), resulting in a long segment at least moderate narrowing, placing the majority of the small bowel at risk for ischemia.  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 The dominant right-sided renal artery is widely patent without hemodynamically significant narrowing.  Left Renal artery: Solitary; widely patent without hemodynamically significant narrowing.  IMA: Appears occluded shortly after its origin with reconstitution from collateral supply from the SMA.  Pelvic vasculature: The bilateral common, external and  internal iliac arteries are of normal caliber and widely patent without hemodynamically significant stenosis.  Review of the MIP images confirms the above findings.   --------------------------------------------------------------------------------  Nonvascular Findings:  There is an ill defined confluent slightly nodular soft tissue mass within the retroperitoneum which is noted to inwardly retract and partially obstruct the bilateral ureters (image 97, series 2 as well as markedly narrow the horizontal segment of the duodenum (image 80, series 2) and circumflex early in case and mildly diffusely narrow the SMA (represented image 74, series 2). These findings have all progressed since prior examination performed 10/02/2012 and worrisome for progression of retroperitoneal fibrosis.  Additionally, there has been progression of bulky pathologically enlarged mesenteric lymph nodes which have also been retracted posteriorly with index mesenteric lymph node measuring approximately 1.4 cm in greatest short axis diameter (image 97, series 2, previously, 1.2 cm in diameter. This finding is worrisome for progression of provided history of metastatic rectal cancer.  Post total colectomy. There is an apparent left lower quadrant end ileostomy as well as an apparent right lower quadrant mucous fistula. The patient has undergone an apparent low anterior resection with ill-defined residual soft tissue within the lower pelvis with associated minimal amount of adjacent fluid within the lower pelvis, presumably sequela radiation change (image 132, series 2).  There is marked narrowing of the horizontal segment of the duodenum (image 80, series 2), secondary to the cranial aspect of the ill-defined retractile retroperitoneal mass. This finding is associated with marked air and fluid distension of the stomach and proximal duodenum. No pneumoperitoneum, pneumatosis or portal venous gas.  Normal hepatic contour. There is mild diffuse  decreased attenuation of the hepatic parenchyma on this postcontrast examination suggestive of hepatic steatosis. No discrete hepatic lesions Post cholecystectomy. No intra or extra hepatic biliary duct dilatation. No ascites.  There is symmetric enhancement of the bilateral kidneys. Interval development of moderate bilateral pelvicaliectasis and ureterectasis to the level of the ill-defined soft tissue mass within the posterior aspect of the retroperitoneum (representative axial image 97, series 2). There is delayed excretion from the bilateral kidneys. There is a minimal amount of bilateral perinephric stranding. Note is made of a subcentimeter (approximately 0.8 cm) hypoattenuating lesion within the posterior aspect of the inferior pole of the left Kidney (79, series 4) which is too small to accurately characterize of favored to represent a renal cyst. No definite renal stones on this postcontrast examination.  Mild distention of the urinary bladder. No asymmetric urinary bladder wall thickening. Scattered minimal calcifications within normal size prostate gland.  Normal appearance of the bilateral adrenal glands and spleen. The pancreas  is markedly atrophic.  Limited visualization of the lower thorax demonstrates minimal subsegmental atelectasis within the bilateral lower lobes and image right middle lobe. No pleural effusion.  Normal heart size. The tip of a central venous catheter terminates within the superior cavoatrial junction. No pericardial effusion.  No acute or aggressive osseous abnormalities. There is straightening expected lumbar lordosis. Mild multilevel lumbar spine DDD, worse at L4-L5 and L5-S1 with disc space height loss, endplate irregularity and sclerosis.  There is a well-healed midline abdominal incision without evidence of hernia formation. There is diffuse body wall edema, most conspicuous about the bilateral thighs and buttocks.  IMPRESSION: 1. Interval worsening of ill-defined  serpiginous retractile retroperitoneal soft mass which partially obstructs the bilateral ureters (now resulting moderate bilateral pelvicaliectasis and ureterectasis) and marked narrowing of the duodenum (resulting in at least partial functional obstruction with upstream dilatation of the proximal duodenum and stomach). 2. 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 3. The celiac artery is retracted caudally secondary to the retroperitoneal fibrosis, though remains widely patent without hemodynamically significant narrowing. The celiac artery appears to be the dominant arterial supply to the intestines with mild hypertrophy of several pancreaticoduodenal arteries providing collateral supply to the SMA distribution. No evidence of pneumatosis or portal venous gas. 4. Post LAR with ill-defined soft tissue and fluid with the lower pelvis, presumably sequela of prior radiation change though locally recurrent disease is not excluded on the basis of this examination. Worsening retroperitoneal adenopathy worrisome for progression of provided history of rectal cancer. Correlation to prior outside examinations is recommended. 5. 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 No definable/drainable fluid collection within the abdomen or pelvis. 6. Suspected hepatic steatosis.  Above findings were discussed by Dr. Markus Daft (interventional radiology) with Dr. Teena Irani at 17:38.   Electronically Signed   By: Sandi Mariscal M.D.   On: 09/12/2013 18:13    Medications:   . antiseptic oral rinse  15 mL  Mouth Rinse q12n4p  . chlorhexidine  15 mL Mouth Rinse BID  . dexamethasone  4 mg Intravenous Q12H  . fentaNYL  100 mcg Transdermal Q72H  . fentaNYL  100 mcg Transdermal Q72H  . fentaNYL  100 mcg Transdermal Q72H  . insulin aspart  0-20 Units Subcutaneous 6 times per day  . scopolamine  1 patch Transdermal Q72H   Continuous Infusions: . sodium chloride 10 mL/hr (09/15/13 2225)  . Marland KitchenTPN (CLINIMIX-E) Adult 90 mL/hr at 09/16/13 1728   And  . fat emulsion 240 mL (09/16/13 1728)  . HYDROmorphone 7 mg/hr (09/17/13 0126)  . octreotide (SANDOSTATIN) infusion 50 mcg/hr (09/17/13 0400)  . pantoprozole (PROTONIX) infusion 8 mg/hr (09/17/13 0724)    Time spent: 25 minutes.    LOS: 8 days   [place]  [facility] Pager [phone]. If unable to reach me by pager, please call my cell phone at [phone].  *Please refer to [email or web address], password [email or web address] to get updated schedule on who will round on this patient, as hospitalists switch teams weekly. If 7PM-7AM, please contact night-coverage at [email or web address], password [email or web address] for any overnight needs.  09/17/2013, 7:37 AM    **Disclaimer:  This note was dictated with voice recognition software. Similar sounding words can inadvertently be transcribed and this note may contain transcription errors which may not have been corrected upon publication of note.**

## 2013-09-17 NOTE — Procedures (Signed)
Interventional Radiology Procedure Note  Procedure: Placement of percutaneous 20F pull-through gastrostomy tube. Complications: None Recommendations: - NPO except for sips and chips remainder of today and overnight - Maintain G-tube to LWS until tomorrow morning  - May advance diet as tolerated and begin using tube tomorrow morning  Signed,  Chenoa Luddy K. Jimya Ciani, MD [facility]   

## 2013-09-17 NOTE — Progress Notes (Signed)
Patient ID: Brandon [name], male   DOB: 05/26/1955, 58 y.o.   MRN: [id number] Pt tent scheduled for palliative/venting percutaneous gastrostomy tube placement today. Details/risks of procedure d/w pt/father by Dr. Laurence Ferrari with their understanding and consent. See H&P from 09/14/13 for additional details of hx. Dr. Hayes/Dr. Rama/Dr. Hilma Favors aware of plans.

## 2013-09-17 NOTE — Progress Notes (Addendum)
Palliative Care Team at [place] Note   SUBJECTIVE:  Brandon [name] is more comfortable less output in NG tube. He agrees to Venting PEG for palliation of possible malignant obstruction and for gastric decompression. Pain well controlled.  OBJECTIVE: Vital Signs: BP 124/93  Pulse 111  Temp(Src) 97.8 F (36.6 C) (Oral)  Resp 27  Ht 5\' 9"  (1.753 m)  Wt 74.6 kg (164 lb 7.4 oz)  BMI 24.28 kg/m2  SpO2 97%   Intake and Output: 06/07 0701 - 06/08 0700 In: 2235.8 [I.V.:1535.8; TPN:700] Out: 2175 [Urine:2075; Emesis/NG output:100]  Physical Exam: General: Chronically ill appearing, lethargic but irritable  Head: Normal, NG in nare without trauma  Lungs:  normal  Heart: Tachy  Abdomen:  Less distended, ostomy ok  Extremities: +edema    Allergies  Allergen Reactions  . Codeine Nausea And Vomiting  . Effexor [Venlafaxine Hcl] Other (See Comments)    Flat Feeling  . Imuran [Azathioprine] Nausea And Vomiting    Medications: Scheduled Meds:  . antiseptic oral rinse  15 mL Mouth Rinse q12n4p  . chlorhexidine  15 mL Mouth Rinse BID  . dexamethasone  4 mg Intravenous Q12H  . fentaNYL  100 mcg Transdermal Q72H  . fentaNYL  100 mcg Transdermal Q72H  . fentaNYL  100 mcg Transdermal Q72H  . fentaNYL      . glucagon (human recombinant)      . insulin aspart  0-20 Units Subcutaneous 6 times per day  . lidocaine      . midazolam      . scopolamine  1 patch Transdermal Q72H    Continuous Infusions: . sodium chloride 10 mL/hr (09/15/13 2225)  . Marland KitchenTPN (CLINIMIX-E) Adult 90 mL/hr at 09/16/13 1728   And  . fat emulsion 240 mL (09/16/13 1728)  . Marland KitchenTPN (CLINIMIX-E) Adult     And  . fat emulsion    . HYDROmorphone 7 mg/hr (09/17/13 0928)  . octreotide (SANDOSTATIN) infusion 50 mcg/hr (09/17/13 0400)  . pantoprozole (PROTONIX) infusion 8 mg/hr (09/17/13 0724)    PRN Meds: acetaminophen, chlorproMAZINE (THORAZINE) IV, diazepam, HYDROmorphone, iohexol, phenol,  promethazine   Labs: CBC    Component Value Date/Time   WBC 14.2* 09/17/2013 0458   RBC 3.25* 09/17/2013 0458   HGB 9.4* 09/17/2013 1015   HCT 28.6* 09/17/2013 1015   PLT 306 09/17/2013 0458   MCV 91.1 09/17/2013 0458   MCH 29.2 09/17/2013 0458   MCHC 32.1 09/17/2013 0458   RDW 16.6* 09/17/2013 0458   LYMPHSABS 0.6* 09/17/2013 0458   MONOABS 0.9 09/17/2013 0458   EOSABS 0.0 09/17/2013 0458   BASOSABS 0.0 09/17/2013 0458    CMET     Component Value Date/Time   NA 133* 09/17/2013 0458   K 4.3 09/17/2013 0458   CL 97 09/17/2013 0458   CO2 28 09/17/2013 0458   GLUCOSE 197* 09/17/2013 0458   BUN 25* 09/17/2013 0458   CREATININE 0.76 09/17/2013 0458   CALCIUM 8.1* 09/17/2013 0458   PROT 5.6* 09/17/2013 0458   ALBUMIN 2.2* 09/17/2013 0458   AST 25 09/17/2013 0458   ALT 63* 09/17/2013 0458   ALKPHOS 164* 09/17/2013 0458   BILITOT 0.4 09/17/2013 0458   GFRNONAA >90 09/17/2013 0458   GFRAA >90 09/17/2013 0458    ASSESSMENT/ PLAN: 58 yo with metastatic rectal cancer, 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   -Dilaudid infusion being titrated to max of 10 for comfort -Continue continuous octreotide infusion- at discharge will give him IM depot Octreotide LAR-need to  get pharmacy to order. Will place order. -High dose PPI infusion-transition to IV injection daily since bleeding has stopped -Decradron-will decrease dose today-once PEG placed can switch to oral and taper oer a few days. -Scopalamine patch added for nausea and GI secretions  His goal is to go home-with hospice and avoid hospitalization. He need complex in home management of his condition.  Time In: 1020 Time Out: 10:45 Total Time Spent with Patient:  25 min Total Overall Time: 25 min   Greater than 50%  of this time was spent counseling and coordinating care related to the above assessment and plan.   Acquanetta Chain, DO  09/17/2013, 2:24 PM  Please contact Palliative Medicine Team phone at [phone] for questions and concerns.

## 2013-09-18 DIAGNOSIS — Z66 Do not resuscitate: Secondary | ICD-10-CM | POA: Diagnosis present

## 2013-09-18 LAB — MAGNESIUM: Magnesium: 1.7 mg/dL (ref 1.5–2.5)

## 2013-09-18 LAB — CBC WITH DIFFERENTIAL/PLATELET
BASOS ABS: 0 10*3/uL (ref 0.0–0.1)
Basophils Relative: 0 % (ref 0–1)
EOS PCT: 0 % (ref 0–5)
Eosinophils Absolute: 0 10*3/uL (ref 0.0–0.7)
HCT: 28.8 % — ABNORMAL LOW (ref 39.0–52.0)
Hemoglobin: 9.6 g/dL — ABNORMAL LOW (ref 13.0–17.0)
LYMPHS ABS: 1.1 10*3/uL (ref 0.7–4.0)
LYMPHS PCT: 8 % — AB (ref 12–46)
MCH: 29.8 pg (ref 26.0–34.0)
MCHC: 33.3 g/dL (ref 30.0–36.0)
MCV: 89.4 fL (ref 78.0–100.0)
MONO ABS: 0.9 10*3/uL (ref 0.1–1.0)
Monocytes Relative: 6 % (ref 3–12)
Neutro Abs: 12 10*3/uL — ABNORMAL HIGH (ref 1.7–7.7)
Neutrophils Relative %: 86 % — ABNORMAL HIGH (ref 43–77)
Platelets: 350 10*3/uL (ref 150–400)
RBC: 3.22 MIL/uL — ABNORMAL LOW (ref 4.22–5.81)
RDW: 16.3 % — AB (ref 11.5–15.5)
WBC: 14.1 10*3/uL — AB (ref 4.0–10.5)

## 2013-09-18 LAB — COMPREHENSIVE METABOLIC PANEL
ALK PHOS: 154 U/L — AB (ref 39–117)
ALT: 52 U/L (ref 0–53)
AST: 21 U/L (ref 0–37)
Albumin: 2.1 g/dL — ABNORMAL LOW (ref 3.5–5.2)
BILIRUBIN TOTAL: 0.5 mg/dL (ref 0.3–1.2)
BUN: 25 mg/dL — AB (ref 6–23)
CHLORIDE: 96 meq/L (ref 96–112)
CO2: 27 mEq/L (ref 19–32)
Calcium: 8.1 mg/dL — ABNORMAL LOW (ref 8.4–10.5)
Creatinine, Ser: 0.8 mg/dL (ref 0.50–1.35)
GFR calc non Af Amer: >90 mL/min (ref >90)
GLUCOSE: 151 mg/dL — AB (ref 70–99)
POTASSIUM: 4 meq/L (ref 3.7–5.3)
Sodium: 134 mEq/L — ABNORMAL LOW (ref 137–147)
Total Protein: 5.2 g/dL — ABNORMAL LOW (ref 6.0–8.3)

## 2013-09-18 LAB — GLUCOSE, CAPILLARY
Glucose-Capillary: 106 mg/dL — ABNORMAL HIGH (ref 70–99)
Glucose-Capillary: 151 mg/dL — ABNORMAL HIGH (ref 70–99)
Glucose-Capillary: 151 mg/dL — ABNORMAL HIGH (ref 70–99)
Glucose-Capillary: 128 mg/dL — ABNORMAL HIGH (ref 70–99)

## 2013-09-18 LAB — PHOSPHORUS: Phosphorus: 4.1 mg/dL (ref 2.3–4.6)

## 2013-09-18 MED ORDER — FENTANYL 100 MCG/HR TD PT72: 100.0000 ug | MEDICATED_PATCH | TRANSDERMAL | Status: DC

## 2013-09-18 MED ORDER — DIAZEPAM 5 MG PO TABS: 5.0000 mg | ORAL_TABLET | Freq: Four times a day (QID) | ORAL | Status: DC | PRN

## 2013-09-18 MED ORDER — SCOPOLAMINE 1 MG/3DAYS TD PT72: 1.0000 | MEDICATED_PATCH | TRANSDERMAL | Status: AC

## 2013-09-18 MED ORDER — SODIUM CHLORIDE 0.9 % IV SOLN: 4.0000 mg/h | INTRAVENOUS | Status: DC

## 2013-09-18 MED ORDER — DOBELLS MT SOLN: 15.0000 mL | Freq: Two times a day (BID) | OROMUCOSAL | Status: AC

## 2013-09-18 MED ORDER — FAT EMULSION 20 % IV EMUL
240.0000 mL | INTRAVENOUS | Status: DC
  Administered 2013-09-18: 240 mL via INTRAVENOUS
  Filled 2013-09-18: qty 250

## 2013-09-18 MED ORDER — M.V.I. ADULT IV INJ
INJECTION | INTRAVENOUS | Status: DC
  Administered 2013-09-18: 18:00:00 via INTRAVENOUS
  Filled 2013-09-18: qty 3000

## 2013-09-18 MED ORDER — DEXAMETHASONE 4 MG PO TABS: 4.0000 mg | ORAL_TABLET | Freq: Every day | ORAL | Status: DC

## 2013-09-18 MED ORDER — DEXAMETHASONE SODIUM PHOSPHATE 4 MG/ML IJ SOLN
2.0000 mg | Freq: Every day | INTRAMUSCULAR | Status: DC
  Administered 2013-09-19: 2 mg via INTRAVENOUS
  Filled 2013-09-18: qty 0.5

## 2013-09-18 MED ORDER — PANTOPRAZOLE SODIUM 40 MG PO TBEC: 40.0000 mg | DELAYED_RELEASE_TABLET | Freq: Two times a day (BID) | ORAL | Status: AC

## 2013-09-18 MED ORDER — HYDROMORPHONE BOLUS VIA INFUSION
2.0000 mg | INTRAVENOUS | Status: DC | PRN
  Administered 2013-09-18 – 2013-09-19 (×10): 2 mg via INTRAVENOUS
  Filled 2013-09-18: qty 2

## 2013-09-18 NOTE — Progress Notes (Signed)
[facility]  Patient Status:  Active pt with [facility] prior to this admission  [facility] is providing the following services:   HHRN, and home infusion pharmacy for home TNA.  We will follow and support DC home when deemed appropriate.  If patient discharges after hours, please call [phone].   Larry Sierras 09/18/2013, 4:33 PM

## 2013-09-18 NOTE — Progress Notes (Signed)
Patient and family taught how to wash g tube site and how to flush peg tube.Patient did procedure well ,father and other family member watched.

## 2013-09-18 NOTE — Progress Notes (Addendum)
Palliative Medicine  Progress Note  Met with patient to assist with care coordination and transition home, Spoke with [facility] infusion pharmacy.  At discharge:  1. Need script stating Resume prior TPN orders -I updated his discharge med list and have done reconciliation since the majority of his medications are for symptom management.  2. Will need new Dilaudid infusion orders $RemoveBeforeD'4mg'NUTONXhnSlvkQs$ /hr with ability toi bolus q30 minutes with $RemoveBefo'2mg'KIjXWXnDODP$ . Dilaudid infusion previously being prescribed by Myrene Buddy- will need to secure a consistent prescriber if he decided to not go with hospice. I have entered and signed his discharge prescription for his dilaudid infusion. I will contact Dr. Tonye Becket on Monday 6/15 when she returns from vacation-her number is [phone].  3. Will change decadron to $RemoveBef'4mg'uzHRmAjzuc$  PO daily, clamp g-tube for 30 minutes after taking tablets.  4. Needs High dose PPI indefinitely.  Patient should be ready for discharge tomorrow (provided he tolerates PEG clamping after PO meds)-he reports that this is the best he has felt in a long time and is please with the level of pain control and outcome since the PEG tube was placed-decompression has been extremely helpful for controlling his symptoms.  -Recommend prophylactic PEG tube Venting TID and then PRN for bloating nausea or after PO intake. Will need outpatient follow up with Dr. Amedeo Plenty at discharge.  5. He should continue his current administration of his Duragesic patches. Will give him new script until he can see Dr. Tonye Becket or Dr. Deitra Mayo at [facility].  Additional Time at bedside: 5:30PM-6:30PM 60 minutes Greater than 50%  of this time was spent counseling and coordinating care related to the above assessment and plan.  Lane Hacker, DO Palliative Medicine [phone]

## 2013-09-18 NOTE — Progress Notes (Addendum)
Palliative Medicine Team Progress Note    Kivon had issues with his PEG not being hooked to suction overnight and according to his father it "popped" due to pressure-after being hooked up to suction it had >1 L out after suction started. He clearly has malignant obstruction at this point. There are multiple loose ends that need to be tightened before he can discharge as followed:  1. Octreotide- [place]- Patient receiving Octreotide 300 mcg in his TPN prior to admission will need to have [place] continue to add this to his TPN mix at home. Will need to request on script-he has high volume output and this will be critical.  2. TPN will continue indefinitely as his only source of nutrition. Family has a paid RN that comes every night at home and hooks him up to this.   3. Venting PEG: Patient's father will need education on how to manage-we may need to consider hooking this up to a gravity drainage bag. He will need suction equipment at home just in case it is needed- will need to know how to utilize Syringe for venting as bare minimum.  4. Oral meds- this may be a challenge- tube will need to be clamped for 30 minutes after administration of oral meds. I will attempt to transition today.  5. Father tells me they are not sure the want to use Hospice yet- only because of the capitation restrictions and regulations- Barney had hospice in the past but per his father he has really good insurance that covers everything related to his condition including the tPN and the IV medications-they have paid out of pocket for nurses and aide to help at home.   6. Pain- he will need continued Dilaudid infusion at home- his pain doctor Dr.Bryant has previously written for these IV medications- but Davante will need a local provider to assist with this as his disease progresses.  When he discharged I recommend Dilaudid at a BASAL rate of 4mg /hr with a PCA bolus dose of 2mg  q30  minutes for uncontrolled pain.  I will try to get a hold of his cancer doctor- Dr. Tonye Becket from [facility] today -she may be willing to prescribe and manage the Dilaudid infusion.  Patient's father requesting Dr. Amedeo Plenty see them in follow-up.

## 2013-09-18 NOTE — Evaluation (Signed)
Physical Therapy Evaluation Patient Details Name: Brandon [name] MRN: [id number] DOB: 1955/12/29 Today's Date: 09/18/2013   History of Present Illness  Pt is a 58 y.o. male with metastatic rectal cancer s/p chemotherapy and admitted on 09/09/13 with GI bleeding in the setting of heavy NSAID use. Pt also with malignant intestinal obstruction s/p venting PEG placed 09/17/13.  Clinical Impression  Pt admitted with above. Pt currently with functional limitations due to the deficits listed below (see PT Problem List).  Pt will benefit from skilled PT to increase their independence and safety with mobility to allow discharge to the venue listed below.  Pt reports increased weakness and agreeable to PT.  Pt ambulated short distance in hallway however limited due to fatigue, nausea, and weakness.  Pt asked for "homework" so recommend performing LAQ and hip flexion in sitting EOB x10 each LE later today when less fatigued and ankle pumps when in bed throughout to day.  Pt agreeable to more exercises next visit as well.    Follow Up Recommendations Home health PT (if pt declines home hospice)    Equipment Recommendations  None recommended by PT    Recommendations for Other Services       Precautions / Restrictions Precautions Precautions: Fall Precaution Comments: venting PEG      Mobility  Bed Mobility Overal bed mobility: Needs Assistance Bed Mobility: Sit to Supine       Sit to supine: Min assist   General bed mobility comments: sitting EOB on arrival, assist to keep LEs from sliding off bed once raised onto bed  Transfers Overall transfer level: Needs assistance Equipment used: Rolling walker (2 wheeled) Transfers: Sit to/from Stand Sit to Stand: Min guard            Ambulation/Gait Ambulation/Gait assistance: Min guard Ambulation Distance (Feet): 40 Feet   Gait Pattern/deviations: Step-through pattern;Decreased stride length;Trunk flexed;Narrow base of support Gait velocity:  decr   General Gait Details: pt required UE support however did wish to use RW today, utilized IV pole and handrail for more support, distance limited by weakness and fatigue as well as nausea so immediately requested wall suction for PEG upon return to room  Stairs            Wheelchair Mobility    Modified Rankin (Stroke Patients Only)       Balance                                             Pertinent Vitals/Pain C/o nausea during gait, had RN immediately reconnect wall suction for PEG, assisted back to supine for comfort    Home Living Family/patient expects to be discharged to:: Private residence Living Arrangements: Parent Available Help at Discharge: Available PRN/intermittently;Personal care 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) Type of Home: House Home Access: Stairs to enter   Technical brewer of Steps: 2-3 Home Layout: Able to live on main level with bedroom/bathroom Home Equipment: Environmental consultant - 2 wheels;Bedside commode Additional Comments: pt believes he has RW and BSC available    Prior Function Level of Independence: Independent               Hand Dominance        Extremity/Trunk Assessment               Lower Extremity Assessment: Generalized weakness    pt  reports edematous extremities upon admission however states much improved; R LE appears to still have edema     Communication   Communication: No difficulties  Cognition Arousal/Alertness: Awake/alert Behavior During Therapy: WFL for tasks assessed/performed Overall Cognitive Status: Within Functional Limits for tasks assessed                      General Comments      Exercises        Assessment/Plan    PT Assessment Patient needs continued PT services  PT Diagnosis Difficulty walking   PT Problem List Decreased strength;Decreased activity tolerance;Decreased mobility  PT Treatment Interventions DME instruction;Gait  training;Functional mobility training;Therapeutic activities;Therapeutic exercise;Patient/family education   PT Goals (Current goals can be found in the Care Plan section) Acute Rehab PT Goals PT Goal Formulation: With patient Time For Goal Achievement: 09/25/13 Potential to Achieve Goals: Good    Frequency Min 3X/week   Barriers to discharge        Co-evaluation               End of Session   Activity Tolerance: Patient limited by fatigue Patient left: in bed;with call bell/phone within reach;with family/visitor present           Time: 1529-1540 PT Time Calculation (min): 11 min   Charges:   PT Evaluation $Initial PT Evaluation Tier I: 1 Procedure PT Treatments $Gait Training: 8-22 mins   PT G CodesJunius [name] 09/18/2013, 4:09 PM Carmelia Bake, PT, DPT 09/18/2013 Pager: [phone]

## 2013-09-18 NOTE — Care Management Note (Addendum)
    Page 1 of 2   09/18/2013     3:19:10 PM CARE MANAGEMENT NOTE 09/18/2013  Patient:  Brandon [name],Brandon [name]   Account Number:  [id number]  Date Initiated:  09/10/2013  Documentation initiated by:  DAVIS,RHONDA  Subjective/Objective Assessment:   pt with hypotension and near syncopal episode, found to be anemic and experiencing rectal bleeding.     Action/Plan:   home when stable  ACTIVE W/[facility]-HHRN   Anticipated DC Date:  09/19/2013   Anticipated DC Plan:  [place] referral  NA      DC Planning Services  CM consult      [facility] Choice  Resumption Of Svcs/PTA Provider   Choice offered to / List presented to:     DME arranged  NA           Status of service:  In process, will continue to follow Medicare Important Message given?  NA - LOS <3 / Initial given by admissions (If response is "NO", the following Medicare IM given date fields will be blank) Date Medicare IM given:   Date Additional Medicare IM given:    Discharge Disposition:    Per UR Regulation:  Reviewed for med. necessity/level of care/duration of stay  If discussed at [place] of Stay Meetings, dates discussed:   09/18/2013    Comments:  09/18/13 Brandon Egger RN,BSN NCM [phone] SPOKE TO PATIENT ABOUT D/C PLANS,ACTIVE W/[facility] HHRN-TPN.WILL RESUME SERVICES-[facility] Brandon [name] AWARE.PATIENT ON DILAUDID IV INFUSION.IF HOME W/DILAUDID INFUSION WILL NEED ORDER, [facility] CAN MANAGE.PLEASE PUT IN HHC ORDERS-HHRN,TPN PER [facility] PROTOCAL,IF HOME W/DILAUDID INFUSION WILL NEED ORDER.NURSE UPDATED.  [id number]/palliative care consult completed/home with hoscipe when stable/ng tube draining [name] fecal material/pt is very irritiable/awaiting decision as to next step/Rhonda Davis,RN,BSn,CCM  [place], [place], [place], [place] [phone] Chart Reviewed for discharge and hospital needs. Discharge needs at time of review: None present will follow for needs. Review of patient progress due on [id number].

## 2013-09-18 NOTE — Progress Notes (Signed)
Pt was sitting up on the side of his bed when I arrived; aunt and uncle were bedside. Introduced myself; pt said he was not in need of anything and was doing fine; aunt and uncle said they were fine also. Pt and family were thankful of my stopping by. Ernest Haber Chaplain  09/18/13 1300  Clinical Encounter Type  Visited With Patient and family together

## 2013-09-18 NOTE — Progress Notes (Signed)
Progress Note   Brandon [name] KYH:[id number] DOB: 08-24-1955 DOA: 09/09/2013 PCP: [name], MD   Brief Narrative:   Brandon [name] is an 58 y.o. male with metastatic rectal cancer who is status post cycle 14 of 5-FU with last chemotherapy administered reportedly 1 week prior to admission who was admitted on 09/09/13 with GI bleeding in the setting of heavy NSAID use. GI evaluated a patient with endoscopy and found duodenal ulcer most likely NSAID induced.   Assessment/Plan:   Principal Problem: GI bleed/acute blood loss anemia secondary to duodenal ulcer/prepyloric ulcer complicated by intestinal obstruction in the setting of metastatic rectal cancer with refractory nausea and vomiting  GI evaluated the patient with EGD on 09/10/13, findings showed a large duodenal ulcer, a moderate sized prepyloric ulcer as well as stigmata of hemorrhage (likely NSAID-related). Recommendations were to continue high-dose PPI therapy with when necessary blood transfusion.  Hemoglobin remains stable status post a total of 4 units of PRBCs. Transfuse as needed for hemoglobin less than 8.  NG tube placed 09/14/13 secondary to intestinal structure in (functional) of the second or third portion of the duodenum. Discontinued after venting PEG placed 09/17/13.  Continue octreotide drip. IM Depo shot will be given prior to discharge.  Active Problems: Hyperglycemia  Likely related to TNA. Continue SSI.  Functional quadriplegia  Secondary to advanced malignancy. Bedbound since admission.  PT evaluation requested 09/17/13.  Activity as tolerated.  Severe protein calorie malnutrition  Remains on TNA, with plans to D/C home on TNA.  Anasarca  Patient received one dose of Lasix on 09/15/13 with improvement.  Cancer related pain  Continue aggressive pain management with Dilaudid-HP drip.  Continue Decadron taper per Dr. Delanna Ahmadi recommendations.  Rectal cancer / Metastatic carcinoma  The  patient follows at [facility], Dr. Marcell Anger (oncology) at [facility] [phone].    Progressive based on CT scan, 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   Palliative care following patient to assist with goals of care and symptom management.  Prognosis poor.  Goal is to go home with hospice.  DVT Prophylaxis  Refuses SCDs. Heparin contraindicated secondary to high-risk of GI bleeding.  Code Status: DNR. Family Communication: Father at bedside. Disposition Plan: Home when stable.   IV Access:    PICC   Procedures:    Right lower extremity venous duplex 09/13/13: No evidence of DVT, superficial thrombosis or Baker cyst.   Medical Consultants:    Dr. Drema Balzarine, Palliative Care.  Dr. Teena Irani, Gastroenterology.  Dr. Jacqulynn Cadet, Interventional Radiology   Other Consultants:    Dietitian   Anti-Infectives:    None.  Subjective:   Brandon [name] is a bit lethargic with myoclonic jerking noted. He had some soreness around the PEG tube last night, but is better today. Had some nausea last night, but for some reason his PEG tube was not hooked up to suction. To suction this morning, and 750 cc of bilious material was drained which alleviated his nausea.  Objective:    Filed Vitals:   09/17/13 1346 09/17/13 1501 09/17/13 2019 09/18/13 0414  BP:  141/80 136/78 139/86  Pulse:  106 104 111  Temp:  99.3 F (37.4 C) 99.2 F (37.3 C) 98.9 F (37.2 C)  TempSrc:  Oral Oral Oral  Resp:  20 18 18   Height:      Weight: 74.6 kg (164 lb 7.4 oz)     SpO2:  95% 98% 98%    Intake/Output Summary (Last  24 hours) at 09/18/13 0744 Last data filed at 09/17/13 2356  Gross per 24 hour  Intake   1703 ml  Output   2275 ml  Net   -572 ml    Exam: Gen:  NAD, myoclonic jerking noted HEENT: NG tube draining dark colored drainage Cardiovascular:  Tachycardic, No M/R/G Respiratory:  Lungs diminished in the bases Gastrointestinal:  Abdomen soft, NT/ND, +  BS Extremities:  2+ edema on the right, 1+ on the left   Data Reviewed:    Labs: Basic Metabolic Panel:  Recent Labs Lab 09/13/13 0355 09/14/13 0500 09/15/13 0500 09/16/13 0505 09/17/13 0458 09/18/13 0404  NA 136* 134* 134*  --  133* 134*  K 3.6* 4.2 4.2  --  4.3 4.0  CL 102 98 99  --  97 96  CO2 25 24 24   --  28 27  GLUCOSE 156* 204* 159*  --  197* 151*  BUN 21 20 21   --  25* 25*  CREATININE 0.74 0.73 0.69  --  0.76 0.80  CALCIUM 7.7* 8.1* 8.1*  --  8.1* 8.1*  MG 1.7 1.9 1.7 1.7 1.7 1.7  PHOS 3.7 3.9 3.7 4.9* 4.2 4.1   GFR Estimated Creatinine Clearance: 101.9 ml/min (by C-G formula based on Cr of 0.8). Liver Function Tests:  Recent Labs Lab 09/13/13 0355 09/17/13 0458 09/18/13 0404  AST 19 25 21   ALT 38 63* 52  ALKPHOS 114 164* 154*  BILITOT 0.4 0.4 0.5  PROT 5.0* 5.6* 5.2*  ALBUMIN 2.1* 2.2* 2.1*   Coagulation profile  Recent Labs Lab 09/15/13 0500  INR 0.98    CBC:  Recent Labs Lab 09/14/13 0500  09/15/13 0500  09/16/13 0505  09/17/13 0245 09/17/13 0458 09/17/13 1015 09/17/13 1828 09/18/13 0404  WBC 8.7  --  11.6*  --  14.6*  --   --  14.2*  --   --  14.1*  NEUTROABS 7.9*  --  10.3*  --  12.8*  --   --  12.7*  --   --  12.0*  HGB 9.7*  < > 9.7*  < > 10.4*  < > 9.4* 9.5* 9.4* 9.4* 9.6*  HCT 28.4*  < > 28.5*  < > 30.9*  < > 29.4* 29.6* 28.6* 30.5* 28.8*  MCV 88.2  --  88.2  --  88.8  --   --  91.1  --   --  89.4  PLT 217  --  256  --  333  --   --  306  --   --  350  < > = values in this interval not displayed. Cardiac Enzymes:  Recent Labs Lab 09/12/13 1101 09/12/13 1725 09/12/13 2251  TROPONINI <0.30 <0.30 <0.30   CBG:  Recent Labs Lab 09/17/13 1156 09/17/13 1350 09/17/13 2016 09/17/13 2355 09/18/13 0404  GLUCAP 121* 207* 152* 110* 151*   Lipid Profile:  Recent Labs  09/17/13 0458  TRIG 61   Sepsis Labs:  Recent Labs Lab 09/15/13 0500 09/16/13 0505 09/17/13 0458 09/18/13 0404  WBC 11.6* 14.6* 14.2* 14.1*    Microbiology Recent Results (from the past 240 hour(s))  CULTURE, BLOOD (ROUTINE X 2)     Status: None   Collection Time    09/09/13  8:45 AM      Result Value Ref Range Status   Specimen Description BLOOD PICC LINE   Final   Special Requests BOTTLES DRAWN AEROBIC AND ANAEROBIC 5CC EACHA   Final   Culture  Setup Time     Final   Value: 09/09/2013 14:17     Performed at [facility]   Culture     Final   Value: NO GROWTH 5 DAYS     Performed at [facility]   Report Status 09/15/2013 FINAL   Final  CULTURE, BLOOD (ROUTINE X 2)     Status: None   Collection Time    09/09/13  9:23 AM      Result Value Ref Range Status   Specimen Description BLOOD RIGHT HAND   Final   Special Requests BOTTLES DRAWN AEROBIC AND ANAEROBIC [facility] EACH   Final   Culture  Setup Time     Final   Value: 09/09/2013 14:16     Performed at [facility]   Culture     Final   Value: NO GROWTH 5 DAYS     Performed at [facility]   Report Status 09/15/2013 FINAL   Final  MRSA PCR SCREENING     Status: None   Collection Time    09/09/13 11:58 AM      Result Value Ref Range Status   MRSA by PCR NEGATIVE  NEGATIVE Final   Comment:            The GeneXpert MRSA Assay (FDA     approved for NASAL specimens     only), is one component of a     comprehensive MRSA colonization     surveillance program. It is not     intended to diagnose MRSA     infection nor to guide or     monitor treatment for     MRSA infections.  URINE CULTURE     Status: None   Collection Time    09/10/13  3:20 AM      Result Value Ref Range Status   Specimen Description URINE, RANDOM   Final   Special Requests NONE   Final   Culture  Setup Time     Final   Value: 09/10/2013 09:26     Performed at [facility]     Final   Value: 6,000 COLONIES/ML     Performed at [facility]   Culture     Final   Value: INSIGNIFICANT GROWTH     Performed at [facility]    Report Status 09/11/2013 FINAL   Final     Radiographs/Studies:   Dg Abd 1 View  09/15/2013   CLINICAL DATA:  Replaced NG tube  EXAM: ABDOMEN - 1 VIEW  COMPARISON:  CT abdomen pelvis dated 09/12/2013  FINDINGS: Enteric tube terminates in the distal esophagus with the side port in the mid esophagus. Advanced approximately 12 cm is suggested.  Nonobstructive bowel gas pattern.  Cholecystectomy clips.  Mild degenerative changes of the thoracic spine.  IMPRESSION: Enteric tube terminates in the distal esophagus with the side port in the mid esophagus. Advanced approximately 12 cm is suggested.   Electronically Signed   By: Julian Hy M.D.   On: 09/15/2013 08:21   Nm Gi Blood Loss  09/12/2013   ADDENDUM REPORT: 09/12/2013 17:26  ADDENDUM: The case was re- reviewed in conjunction with CT of the abdomen and pelvis which of was obtained subsequent to this exam. The activity which was previously felt well likely represent radiotracer accumulation within the patient's left ostomy is more likely to represent pooling of radiotracer activity within the patient's obstructed stomach. It  is difficult to say, with a high degree of certainty the whether or not the gastric activity is free pertechnetate or extravasated radiotracer from an active gastric bleed GI bleed. However, given the lack of significant increase in intensity over time this is favored to represent a likely free pertechnetate.   Electronically Signed   By: Kerby Moors M.D.   On: 09/12/2013 17:26   09/12/2013   CLINICAL DATA:  GI bleeding  EXAM: NUCLEAR MEDICINE GASTROINTESTINAL BLEEDING SCAN  TECHNIQUE: Sequential abdominal images were obtained following intravenous administration of Tc-73m labeled red blood cells.  RADIOPHARMACEUTICALS:  26.7 mCi Tc-63m in-vitro labeled red cells.  COMPARISON:  CTA 09/12/2013  FINDINGS: Dynamically acquired sequential images of the abdomen and pelvis show a radiotracer accumulation within patient's left sided  ostomy bag. The radiotracer is felt a likely originate from a proximal/ upper GI source, presumably distal stomach or proximal duodenum. Physiologic tracer activity is seen within the liver, spleen and urinary bladder as well as blood pool.  IMPRESSION: 1. Examination is positive for active GI bleeding with radiotracer accumulation in the patient's left abdominal ostomy. Source of bleeding is likely upper GI tract, either distal stomach or proximal duodenum.  Electronically Signed: By: Kerby Moors M.D. On: 09/12/2013 17:10   Dg Chest Port 1 View  09/09/2013   CLINICAL DATA:  Weakness and nausea  EXAM: PORTABLE CHEST - 1 VIEW  COMPARISON:  Length 10/21/2007  FINDINGS: Dual lumen catheter is now seen on the right with the tip at the cavoatrial junction. The cardiac shadow is within normal limits. The lungs are clear. No bony abnormality is noted.  IMPRESSION: No active disease.   Electronically Signed   By: Inez Catalina M.D.   On: 09/09/2013 09:04   Dg Abd Portable 1v  09/16/2013   CLINICAL DATA:  NG tube placement.  EXAM: PORTABLE ABDOMEN - 1 VIEW  COMPARISON:  09/15/2013  FINDINGS: Nasogastric tube is again seen with slightly bigger loop over the stomach with tip over the left upper quadrant in the region of the gastric cardia unchanged. Visualized bowel gas pattern is nonobstructive. Right IJ central venous catheter is unchanged with tip over the SVC. Remainder the exam is unchanged.  IMPRESSION: Nonobstructive bowel gas pattern.  Nasogastric tube with slightly bigger loop over the stomach and tip unchanged over the medial gastric cardia.   Electronically Signed   By: Marin Olp M.D.   On: 09/16/2013 09:42   Dg Abd Portable 1v  09/15/2013   CLINICAL DATA:  NG tube adjustment  EXAM: PORTABLE ABDOMEN - 1 VIEW  COMPARISON:  09/15/2013 at 0731 hr  FINDINGS: Enteric tube terminates in the gastric cardia.  Cholecystectomy clips.  Central venous catheter terminates at the cavoatrial junction.  Mild patchy  left basilar opacity, likely atelectasis.  IMPRESSION: Enteric tube terminates in the gastric cardia.   Electronically Signed   By: Julian Hy M.D.   On: 09/15/2013 09:32   Ct Angio Abd/pel W/ And/or W/o  09/12/2013   CLINICAL DATA:  History of metastatic colon cancer (2009), post chemotherapy, post ileostomy now with nausea and vomiting and duodenal ulcer. Evaluate for source of bleed given undefined anatomy post ostomy revision.  EXAM: CT ANGIOGRAPHY ABDOMEN AND PELVIS WITH CONTRAST AND WITHOUT CONTRAST  TECHNIQUE: Multidetector CT imaging of the abdomen and pelvis was performed using the standard protocol during bolus administration of intravenous contrast. Multiplanar reconstructed images including MIPs were obtained and reviewed to evaluate the vascular anatomy.  CONTRAST:  15mL  OMNIPAQUE IOHEXOL 350 MG/ML SOLN  COMPARISON:  CT of the abdomen pelvis - 09/12/2012  FINDINGS: Vascular Findings:  Abdominal aorta: Normal caliber of the abdominal aorta. No abdominal aortic dissection. Ill-defined sclerosing soft tissue within the lower abdomen/ upper pelvis abuts the anterior wall of the abdominal aorta though does not result in a hemodynamically significant stenosis.  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 While the cranial aspect of the retroperitoneal fibrosis abuts the caudal surface of the celiac artery, it does not result in a hemodynamically significant stenosis or vessel narrowing. Conventional branching pattern. There is mild hypertrophy of multiple pancreaticoduodenal arterial collaterals, providing additional arterial supply to the SMA.  SMA: The retroperitoneal fibrosis circumferentially surrounds and narrows the SMA throughout its course (image 62, series 5), resulting in a long segment at least moderate narrowing, placing the majority of the small bowel at risk for ischemia.  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 The dominant right-sided renal artery is widely patent without hemodynamically significant narrowing.  Left Renal artery: Solitary; widely patent without hemodynamically significant narrowing.  IMA: Appears occluded shortly after its origin with reconstitution from collateral supply from the SMA.  Pelvic vasculature: The bilateral common, external and internal iliac arteries are of normal caliber and widely patent without hemodynamically significant stenosis.  Review of the MIP images confirms the above findings.   --------------------------------------------------------------------------------  Nonvascular Findings:  There is an ill defined confluent slightly nodular soft tissue mass within the retroperitoneum which is noted to inwardly retract and partially obstruct the bilateral ureters (image 97, series 2 as well as markedly narrow the horizontal segment of the duodenum (image 80, series 2) and circumflex early in case and mildly diffusely narrow the SMA (represented image 74, series 2). These findings have all progressed since prior examination performed 10/02/2012 and worrisome for progression of retroperitoneal fibrosis.  Additionally, there has been progression of bulky pathologically enlarged mesenteric lymph nodes which have also been retracted posteriorly with index mesenteric lymph node measuring approximately 1.4 cm in greatest short axis diameter (image 97, series 2, previously, 1.2 cm in diameter. This finding is worrisome for progression of provided history of metastatic rectal cancer.  Post total colectomy. There is an apparent left lower quadrant end ileostomy as well as an apparent right lower quadrant mucous fistula. The patient has undergone an apparent low anterior resection with ill-defined residual soft tissue within the lower pelvis with associated minimal amount of adjacent fluid within  the lower pelvis, presumably sequela radiation change (image 132, series 2).  There is marked narrowing of the horizontal segment of the duodenum (image 80, series 2), secondary to the cranial aspect of the ill-defined retractile retroperitoneal mass. This finding is associated with marked air and fluid distension of the stomach and proximal duodenum. No pneumoperitoneum, pneumatosis or portal venous gas.  Normal hepatic contour. There is mild diffuse decreased attenuation of the hepatic parenchyma on this postcontrast examination suggestive of hepatic steatosis. No discrete hepatic lesions Post cholecystectomy. No intra or extra hepatic biliary duct dilatation. No ascites.  There is symmetric enhancement of the bilateral kidneys. Interval development of moderate bilateral pelvicaliectasis and ureterectasis to the level of the ill-defined soft tissue mass within the posterior aspect of the retroperitoneum (representative axial image 97, series 2). There is delayed excretion from the bilateral kidneys. There is a minimal amount of bilateral perinephric  stranding. Note is made of a subcentimeter (approximately 0.8 cm) hypoattenuating lesion within the posterior aspect of the inferior pole of the left Kidney (79, series 4) which is too small to accurately characterize of favored to represent a renal cyst. No definite renal stones on this postcontrast examination.  Mild distention of the urinary bladder. No asymmetric urinary bladder wall thickening. Scattered minimal calcifications within normal size prostate gland.  Normal appearance of the bilateral adrenal glands and spleen. The pancreas is markedly atrophic.  Limited visualization of the lower thorax demonstrates minimal subsegmental atelectasis within the bilateral lower lobes and image right middle lobe. No pleural effusion.  Normal heart size. The tip of a central venous catheter terminates within the superior cavoatrial junction. No pericardial effusion.  No  acute or aggressive osseous abnormalities. There is straightening expected lumbar lordosis. Mild multilevel lumbar spine DDD, worse at L4-L5 and L5-S1 with disc space height loss, endplate irregularity and sclerosis.  There is a well-healed midline abdominal incision without evidence of hernia formation. There is diffuse body wall edema, most conspicuous about the bilateral thighs and buttocks.  IMPRESSION: 1. Interval worsening of ill-defined serpiginous retractile retroperitoneal soft mass which partially obstructs the bilateral ureters (now resulting moderate bilateral pelvicaliectasis and ureterectasis) and marked narrowing of the duodenum (resulting in at least partial functional obstruction with upstream dilatation of the proximal duodenum and stomach). 2. 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 3. The celiac artery is retracted caudally secondary to the retroperitoneal fibrosis, though remains widely patent without hemodynamically significant narrowing. The celiac artery appears to be the dominant arterial supply to the intestines with mild hypertrophy of several pancreaticoduodenal arteries providing collateral supply to the SMA distribution. No evidence of pneumatosis or portal venous gas. 4. Post LAR with ill-defined soft tissue and fluid with the lower pelvis, presumably sequela of prior radiation change though locally recurrent disease is not excluded on the basis of this examination. Worsening retroperitoneal adenopathy worrisome for progression of provided history of rectal cancer. Correlation to prior outside examinations is recommended. 5. Extensive postsurgical change of the abdomen  including sequela of prior colectomy, left lower quadrant ileostomy and right lower quadrant mucous fistula formation. No definable/drainable fluid collection within the abdomen or pelvis. 6. Suspected hepatic steatosis.  Above findings were discussed by Dr. Markus Daft (interventional radiology) with Dr. Teena Irani at 17:38.   Electronically Signed   By: Sandi Mariscal M.D.   On: 09/12/2013 18:13    Medications:   . antiseptic oral rinse  15 mL Mouth Rinse q12n4p  . chlorhexidine  15 mL Mouth Rinse BID  . dexamethasone  2 mg Intravenous Q12H  . fentaNYL  100 mcg Transdermal Q72H  . fentaNYL  100 mcg Transdermal Q72H  . fentaNYL  100 mcg Transdermal Q72H  . insulin aspart  0-20 Units Subcutaneous 6 times per day  . pantoprazole (PROTONIX) IV  40 mg Intravenous Q12H  . scopolamine  1 patch Transdermal Q72H   Continuous Infusions: . sodium chloride 10 mL/hr (09/15/13 2225)  . Marland KitchenTPN (CLINIMIX-E) Adult 90 mL/hr at 09/17/13 1709   And  . fat emulsion 240 mL (09/17/13 1708)  . HYDROmorphone 7 mg/hr (09/18/13 0702)  . octreotide (SANDOSTATIN) infusion 50 mcg/hr (  09/18/13 0130)    Time spent: 25 minutes.    LOS: 9 days   [place]  [facility] Pager [phone]. If unable to reach me by pager, please call my cell phone at [phone].  *Please refer to [email or web address], password [email or web address] to get updated schedule on who will round on this patient, as hospitalists switch teams weekly. If 7PM-7AM, please contact night-coverage at [email or web address], password [email or web address] for any overnight needs.  09/18/2013, 7:44 AM    **Disclaimer: This note was dictated with voice recognition software. Similar sounding words can inadvertently be transcribed and this note may contain transcription errors which may not have been corrected upon publication of note.**

## 2013-09-18 NOTE — Progress Notes (Signed)
Follow up after G-tube placed yesterday. Apparently was not hooked to LIWS last pm. Finally hooked up this am  With over 1L bilious output already. Feels a bit better. Still sore.  BP 139/86  Pulse 111  Temp(Src) 98.9 F (37.2 C) (Oral)  Resp 18  Ht 5\' 9"  (1.753 m)  Wt 164 lb 7.4 oz (74.6 kg)  BMI 24.28 kg/m2  SpO2 98% Abd: soft, ND, Mildly tender at G-tube site, no bleeding or leakage.  S/p perc G-tube 6/8 Would keep to LIWS today. [place] for clears  Ascencion Dike PA-C Interventional Radiology 09/18/2013 10:11 AM

## 2013-09-18 NOTE — Progress Notes (Signed)
PARENTERAL NUTRITION CONSULT NOTE - Follow Up  Pharmacy Consult for TPN Indication: Short bowel d/t rectal cancer  Allergies  Allergen Reactions  . Codeine Nausea And Vomiting  . Effexor [Venlafaxine Hcl] Other (See Comments)    Flat Feeling  . Imuran [Azathioprine] Nausea And Vomiting   Patient Measurements: Height: 5\' 9"  (175.3 cm) Weight: 164 lb 7.4 oz (74.6 kg) IBW/kg (Calculated) : 70.7  Vital Signs: Temp: 98.9 F (37.2 C) (06/09 0414) Temp src: Oral (06/09 0414) BP: 139/86 mmHg (06/09 0414) Pulse Rate: 111 (06/09 0414) Intake/Output from previous day: 06/08 0701 - 06/09 0700 In: 1703 [I.V.:791; TPN:912] Out: 2275 [Urine:2075; Stool:200] Intake/Output from this shift:    Labs:  Recent Labs  09/16/13 0505  09/17/13 0458 09/17/13 1015 09/17/13 1828 09/18/13 0404  WBC 14.6*  --  14.2*  --   --  14.1*  HGB 10.4*  < > 9.5* 9.4* 9.4* 9.6*  HCT 30.9*  < > 29.6* 28.6* 30.5* 28.8*  PLT 333  --  306  --   --  350  < > = values in this interval not displayed.   Recent Labs  09/16/13 0505 09/17/13 0458 09/18/13 0404  NA  --  133* 134*  K  --  4.3 4.0  CL  --  97 96  CO2  --  28 27  GLUCOSE  --  197* 151*  BUN  --  25* 25*  CREATININE  --  0.76 0.80  CALCIUM  --  8.1* 8.1*  MG 1.7 1.7 1.7  PHOS 4.9* 4.2 4.1  PROT  --  5.6* 5.2*  ALBUMIN  --  2.2* 2.1*  AST  --  25 21  ALT  --  63* 52  ALKPHOS  --  164* 154*  BILITOT  --  0.4 0.5  PREALBUMIN  --  24.8  --   TRIG  --  61  --    Estimated Creatinine Clearance: 101.9 ml/min (by C-G formula based on Cr of 0.8).    Recent Labs  09/17/13 2355 09/18/13 0404 09/18/13 0749  GLUCAP 110* 151* 151*   Insulin Requirements in the past 24 hours:  22 units SSI -  CBG range 207-110 30 units insulin in TNA delivering 21.6 units/24h Octreotide infusion: can increase blood glucose  Current Nutrition:  NPO Clinimix E 5/20 at a goal rate of 5ml/hr + 20% fat emulsion at 103ml/hr continuous infusions  IVF: NS  at 10 ml/hr  Assessment: 57yo M w/ metastatic rectal cancer on TPN at home, admitted w/ syncope and vomiting blood. Also on an ambulatory Dilaudid PCA and getting IV chemotherapy at home via [facility]. 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  Recently was considering hospice. Pharmacy is asked to dose TPN while inpatient.    Glucose -On resistant SSI and 30 units insulin in TNA.    Electrolytes - K+/Mag/Phos WNL.  Na 134, unable to adjust in TNA. Corrected calcium WNL at 9.2.  Renal - SCr WNL  LFTs - ALT and alk phos slightly elevated  TGs - 61 (6/8)   Prealbumin - 24.8 (6/8)  Palliative following.  On octreotide infusion and Protonix IV q12  Placed decompressive PEG tube 6/8, to suction this am: returned 1L bilious fluid this am   Nutritional Goals:   RD's Estimated Nutritional Needs (6/1): Kcal: 2300-2500,  Protein: 107-120 grams, Fluid: >/=2.3 L/day   TPN PTA: Cyclic infusion (3M-6Q), 2540ml delivered. Bag includes lipids one day a week. Daily avg: 1824Kcal and 96g protein. Standard MVI/TE. No insulin. Octreotide 351mcg/bag.  RD's recommendations this admission are higher than what patient is receiving in home TNA.  Will adjust inpatient TNA to meet RD's goals.  Clinimix E 5/20 at a goal rate of 74ml/hr + 20% fat emulsion at 7ml/hr to provide: 108g/day protein, 2380Kcal/day.  Providing over continuous infusion over 24 hours for now (as opposed to cycling over 12 hours) due to acute illness and increased nutrition goals.  Patient is receiving octreotide gtt.  TPN Access: PICC TPN day#: 10 (inpatient), has been on TPN for 9 months per patient report  Plan: At 1800:  Continue Clinimix E 5/20 at 90 ml/hr.    20% fat emulsion at 79ml/hr.  Continue 10 units/L of regular insulin to TNA.  Continue SSI resistant scale q4h.    TNA to contain standard multivitamins and trace elements.  Continue IVF reduced to 10 ml/hr to account for volume provided by TNA  TNA lab panels on Mondays & Thursdays  Thank you for the consult.  Minda Ditto PharmD Pager [phone] 09/18/2013, 12:22 PM

## 2013-09-19 DIAGNOSIS — K269 Duodenal ulcer, unspecified as acute or chronic, without hemorrhage or perforation: Secondary | ICD-10-CM

## 2013-09-19 LAB — CBC WITH DIFFERENTIAL/PLATELET
BASOS ABS: 0 10*3/uL (ref 0.0–0.1)
Basophils Relative: 0 % (ref 0–1)
Eosinophils Absolute: 0.2 10*3/uL (ref 0.0–0.7)
Eosinophils Relative: 1 % (ref 0–5)
HEMATOCRIT: 30.2 % — AB (ref 39.0–52.0)
Hemoglobin: 9.6 g/dL — ABNORMAL LOW (ref 13.0–17.0)
Lymphocytes Relative: 10 % — ABNORMAL LOW (ref 12–46)
Lymphs Abs: 1.2 10*3/uL (ref 0.7–4.0)
MCH: 29.1 pg (ref 26.0–34.0)
MCHC: 31.8 g/dL (ref 30.0–36.0)
MCV: 91.5 fL (ref 78.0–100.0)
MONOS PCT: 9 % (ref 3–12)
Monocytes Absolute: 1.1 10*3/uL — ABNORMAL HIGH (ref 0.1–1.0)
Neutro Abs: 9.8 10*3/uL — ABNORMAL HIGH (ref 1.7–7.7)
Neutrophils Relative %: 80 % — ABNORMAL HIGH (ref 43–77)
Platelets: 364 10*3/uL (ref 150–400)
RBC: 3.3 MIL/uL — ABNORMAL LOW (ref 4.22–5.81)
RDW: 16.3 % — AB (ref 11.5–15.5)
WBC: 12.2 10*3/uL — ABNORMAL HIGH (ref 4.0–10.5)

## 2013-09-19 LAB — HEMOGLOBIN AND HEMATOCRIT, BLOOD
HEMATOCRIT: 28.9 % — AB (ref 39.0–52.0)
HEMOGLOBIN: 9.6 g/dL — AB (ref 13.0–17.0)

## 2013-09-19 LAB — PHOSPHORUS: PHOSPHORUS: 4.6 mg/dL (ref 2.3–4.6)

## 2013-09-19 LAB — MAGNESIUM: Magnesium: 1.7 mg/dL (ref 1.5–2.5)

## 2013-09-19 NOTE — Progress Notes (Signed)
[facility]  Father brought C[employer] pump to hospital for reconnection to new bag of Dilaudid prior to DC home.  C[employer] pump had existing "old" bag of Dilaudid still attached.  Reservoir Volume reads 409 mls remaining.  Wasted this amount in sink on [address] [place].  Witnessed by Trenda Moots, Therapist, sports.   Larry Sierras 09/19/2013, 3:01 PM  I witnessed this waste.

## 2013-09-19 NOTE — Discharge Summary (Signed)
Physician Discharge Summary  Brandon [name] EHU:[id number] DOB: 04/20/1955 DOA: 09/09/2013  PCP: [name], MD  Admit date: 09/09/2013 Discharge date: 09/19/2013   Recommendations for Outpatient Follow-Up:   1. Home health RN and PT arranged. 2. Venting PEG tube placed, please look up to gravity or suction at least 3 times a day and as needed for abdominal distention. 3.  TNA per [facility] protocol (Sandostatin increased to 500 mcg/bag, can titrate up further if needed).   Discharge Diagnosis:   Principal Problem:    GI bleed Active Problems:    Anemia associated with acute blood loss    Rectal cancer    Metastatic carcinoma    Refractory nausea and vomiting    Duodenal ulcer disease    Intestinal obstruction    Hyperglycemia    Functional quadriplegia    Protein-calorie malnutrition, severe    Anasarca    Cancer related pain    DNR (do not resuscitate)  Discharge Condition: Improved.  Diet recommendation: Liquids as tolerated.   History of Present Illness:   Brandon [name] is an 58 y.o. male with metastatic rectal cancer who is status post cycle 14 of 5-FU with last chemotherapy administered reportedly 1 week prior to admission who was admitted on 09/09/13 with GI bleeding in the setting of heavy NSAID use. GI evaluated a patient with endoscopy and found duodenal ulcer most likely NSAID induced.   Hospital Course by Problem:   Principal Problem: GI bleed with acute blood loss anemia secondary to duodenal ulcer/prepyloric ulcer complicated by intestinal obstruction with refractory nausea and vomiting in the setting of metastatic rectal cancer  GI evaluated the patient with EGD on 09/10/13, findings showed a large duodenal ulcer, a moderate sized prepyloric ulcer as well as stigmata of hemorrhage (likely NSAID-related). Recommendations were to continue high-dose PPI therapy with when necessary blood transfusion. Hemoglobin remains stable status post a total  of 4 units of PRBCs. Transfuse as needed for hemoglobin less than 8.   NG tube placed 09/14/13 secondary to intestinal structure in (functional) of the second or third portion of the duodenum. Discontinued after venting PEG placed 09/17/13.   GI secretions managed with octreotide drip. Octreotide will be resumed in TNA at discharge.  Active Problems:  Hyperglycemia   Likely related to TNA. Treated with SSI.  Functional quadriplegia   Secondary to advanced malignancy. Bedbound since admission.   Evaluated by physical therapist on 09/18/13, home health PT arranged pruritic medications.  DME: Wheelchair ordered for patient at discharge.  Severe protein calorie malnutrition   Remains on TNA, with plans to D/C home on TNA.  Anasarca   Patient received one dose of Lasix on 09/15/13 with improvement.  Cancer related pain   Continue aggressive pain management with Dilaudid-HP drip.   Continue Decadron taper per Dr. Delanna Ahmadi recommendations.  Rectal cancer / Metastatic carcinoma   The patient follows at [facility], Dr. Marcell Anger (oncology) at [facility] [phone].   Progressive based on CT scan, 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   Palliative care following patient to assist with goals of care and symptom management.   Prognosis poor.    Procedures:    EGD 09/10/13:1 small pyloric channel and one very large distal duodenal ulcer with a adherent blood clot.  Right lower extremity venous duplex 09/13/13: No evidence of DVT, superficial thrombosis or Baker cyst.  Attending PEG tube placement 09/17/13.  Medical Consultants:    Dr. Rhea Pink, Palliative Care.   Dr. Teena Irani, Gastroenterology.  Dr. Jacqulynn Cadet, Interventional Radiology  Discharge Exam:   Filed Vitals:   09/19/13 0644  BP: 138/78  Pulse: 113  Temp: 98.7 F (37.1 C)  Resp: 16   Filed Vitals:   09/18/13 0414 09/18/13 1400 09/18/13 2118 09/19/13 0644  BP: 139/86 142/84 129/83 138/78    Pulse: 111 112 112 113  Temp: 98.9 F (37.2 C) 99 F (37.2 C) 98.2 F (36.8 C) 98.7 F (37.1 C)  TempSrc: Oral Oral Oral Oral  Resp: 18 18 18 16   Height:      Weight:      SpO2: 98% 98% 99% 98%    Gen: NAD, myoclonic jerking noted  HEENT: NG tube draining dark colored drainage  Cardiovascular: Tachycardic, No M/R/G  Respiratory: Lungs diminished in the bases  Gastrointestinal: Abdomen soft, NT/ND, + BS  Extremities: 2+ edema on the right, 1+ on the left   Discharge Instructions:   Discharge Instructions   Diet general    Complete by:  As directed   Sips of liquids as tolerated.     Discharge instructions    Complete by:  As directed   Hook PEG tube up to suction if it does not drain to gravity 3 times a day and as needed for bloating, nausea, stomach discomfort.  Physical therapy will come to your house and work with you on your strength and balance.  You were cared for by Dr. Jacquelynn Cree  (a hospitalist) during your hospital stay. If you have any questions about your discharge medications or the care you received while you were in the hospital after you are discharged, you can call the unit and ask to speak with the hospitalist on call if the hospitalist that took care of you is not available. Once you are discharged, your primary care physician will handle any further medical issues. Please note that NO REFILLS for any discharge medications will be authorized once you are discharged, as it is imperative that you return to your primary care physician (or establish a relationship with a primary care physician if you do not have one) for your aftercare needs so that they can reassess your need for medications and monitor your lab values.  Any outstanding tests can be reviewed by your PCP at your follow up visit.  It is also important to review any medicine changes with your PCP.  Please bring these d/c instructions with you to your next visit so your physician can review these  changes with you.  If you do not have a primary care physician, you can call [phone] for a physician referral.  It is highly recommended that you obtain a PCP for hospital follow up.     Face-to-face encounter (required for Medicare/Medicaid patients)    Complete by:  As directed   I RAMA,CHRISTINA certify that this patient is under my care and that I, or a nurse practitioner or physician's assistant working with me, had a face-to-face encounter that meets the physician face-to-face encounter requirements with this patient on 09/19/2013. The encounter with the patient was in whole, or in part for the following medical condition(s) which is the primary reason for home health care (List medical condition): Home health RN to teach ongoing PEG care, provide suction device to allow for gastric decompression.  Please resume prior TNA orders.  The encounter with the patient was in whole, or in part, for the following medical condition, which is the primary reason for home health care:  Advanced rectal cancer with  malignant obstruction  I certify that, based on my findings, the following services are medically necessary home health services:  Physical therapy  My clinical findings support the need for the above services:  Unable to leave home safely without assistance and/or assistive device  Further, I certify that my clinical findings support that this patient is homebound due to:  Unable to leave home safely without assistance  Reason for Medically Necessary Home Health Services:   Skilled Nursing- Change/Decline in Patient Status Skilled Nursing- Changes in Medication/Medication Management Skilled Nursing- Teaching of Disease Process/Symptom Management Skilled Nursing- Lab Monitoring Requiring Frequent Medication Adjustment Therapy- Home Adaptation to Facilitate Safety Therapy- Instruction on Safe use of Assistive Devices for ADLs Therapy- Therapeutic Exercises to Increase Strength and Endurance         For home use only DME standard manual wheelchair with seat cushion    Complete by:  As directed   Patient suffers from advanced cancer, malnutrition, generalized weakness which impairs their ability to perform daily activities like ADLs in the home.  A cane or walker will not resolve  issue with performing activities of daily living. A wheelchair will allow patient to safely perform daily activities. Patient can safely propel the wheelchair in the home or has a caregiver who can provide assistance.  Accessories: elevating leg rests (ELRs), wheel locks, extensions and anti-tippers.     Home Health    Complete by:  As directed   To provide the following care/treatments:  RN     Increase activity slowly    Complete by:  As directed             Medication List    STOP taking these medications       cabergoline 0.5 MG tablet  Commonly known as:  DOSTINEX     clonazePAM 0.5 MG tablet  Commonly known as:  KLONOPIN     cyclobenzaprine 10 MG tablet  Commonly known as:  FLEXERIL     DILAUDID IJ     gabapentin 300 MG capsule  Commonly known as:  NEURONTIN     ibuprofen 200 MG tablet  Commonly known as:  ADVIL,MOTRIN     PRESCRIPTION MEDICATION     prochlorperazine 10 MG tablet  Commonly known as:  COMPAZINE      TAKE these medications       acetaminophen 500 MG tablet  Commonly known as:  TYLENOL  Take 500 mg by mouth every 4 (four) hours as needed for pain.     dexamethasone 4 MG tablet  Commonly known as:  DECADRON  Take 1 tablet (4 mg total) by mouth daily with breakfast. Clamp PEG at least 30 minutes after taking     diazepam 5 MG tablet  Commonly known as:  VALIUM  Take 1 tablet (5 mg total) by mouth every 6 (six) hours as needed for anxiety, muscle spasms or sedation. Clamp tube for 30 minutes after administration     DOBELLS Soln  Use as directed 15 mLs in the mouth or throat 2 (two) times daily.     fentaNYL 100 MCG/HR  Commonly known as:  DURAGESIC - dosed  mcg/hr  Place 1 patch (100 mcg total) onto the skin every 3 (three) days.     fentaNYL 100 MCG/HR  Commonly known as:  DURAGESIC - dosed mcg/hr  Place 1 patch (100 mcg total) onto the skin every 3 (three) days.     fentaNYL 100 MCG/HR  Commonly known as:  DURAGESIC -  dosed mcg/hr  Place 1 patch (100 mcg total) onto the skin every 3 (three) days.     fentaNYL 100 MCG/HR  Commonly known as:  DURAGESIC - dosed mcg/hr  Place 300 mcg onto the skin every 3 (three) days. Patient has three 11mcg patches on at all times but staggers their application so he changes one patch each day.     HYDROmorphone 200 mg in sodium chloride 0.9 % 80 mL  Inject 4 mg/hr into the vein continuous. 4mg /hr continuous infusion BASAL RATE with PCA bolus dose 2mg  every 30 minutes prn for breakthrough pain with no lock out dose.     ondansetron 8 MG disintegrating tablet  Commonly known as:  ZOFRAN-ODT  Take 8 mg by mouth every 8 (eight) hours as needed for nausea or vomiting.     pantoprazole 40 MG tablet  Commonly known as:  PROTONIX  Take 1 tablet (40 mg total) by mouth 2 (two) times daily.     PRESCRIPTION MEDICATION     promethazine 25 MG tablet  Commonly known as:  PHENERGAN  Take 25 mg by mouth every 6 (six) hours as needed for nausea.     scopolamine 1 MG/3DAYS  Commonly known as:  TRANSDERM-SCOP  Place 1 patch (1.5 mg total) onto the skin every 3 (three) days.     tamsulosin 0.4 MG Caps capsule  Commonly known as:  FLOMAX  Take 0.4 mg by mouth at bedtime.          The results of significant diagnostics from this hospitalization (including imaging, microbiology, ancillary and laboratory) are listed below for reference.     Significant Diagnostic Studies:   Radiographs: Dg Abd 1 View  09/15/2013   CLINICAL DATA:  Replaced NG tube  EXAM: ABDOMEN - 1 VIEW  COMPARISON:  CT abdomen pelvis dated 09/12/2013  FINDINGS: Enteric tube terminates in the distal esophagus with the side port in the mid  esophagus. Advanced approximately 12 cm is suggested.  Nonobstructive bowel gas pattern.  Cholecystectomy clips.  Mild degenerative changes of the thoracic spine.  IMPRESSION: Enteric tube terminates in the distal esophagus with the side port in the mid esophagus. Advanced approximately 12 cm is suggested.   Electronically Signed   By: Julian Hy M.D.   On: 09/15/2013 08:21   Nm Gi Blood Loss  09/12/2013   ADDENDUM REPORT: 09/12/2013 17:26  ADDENDUM: The case was re- reviewed in conjunction with CT of the abdomen and pelvis which of was obtained subsequent to this exam. The activity which was previously felt well likely represent radiotracer accumulation within the patient's left ostomy is more likely to represent pooling of radiotracer activity within the patient's obstructed stomach. It is difficult to say, with a high degree of certainty the whether or not the gastric activity is free pertechnetate or extravasated radiotracer from an active gastric bleed GI bleed. However, given the lack of significant increase in intensity over time this is favored to represent a likely free pertechnetate.   Electronically Signed   By: Kerby Moors M.D.   On: 09/12/2013 17:26   09/12/2013   CLINICAL DATA:  GI bleeding  EXAM: NUCLEAR MEDICINE GASTROINTESTINAL BLEEDING SCAN  TECHNIQUE: Sequential abdominal images were obtained following intravenous administration of Tc-48m labeled red blood cells.  RADIOPHARMACEUTICALS:  26.7 mCi Tc-50m in-vitro labeled red cells.  COMPARISON:  CTA 09/12/2013  FINDINGS: Dynamically acquired sequential images of the abdomen and pelvis show a radiotracer accumulation within patient's left sided ostomy bag. The radiotracer  is felt a likely originate from a proximal/ upper GI source, presumably distal stomach or proximal duodenum. Physiologic tracer activity is seen within the liver, spleen and urinary bladder as well as blood pool.  IMPRESSION: 1. Examination is positive for active GI  bleeding with radiotracer accumulation in the patient's left abdominal ostomy. Source of bleeding is likely upper GI tract, either distal stomach or proximal duodenum.  Electronically Signed: By: Kerby Moors M.D. 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 He currently has a nasogastric tube for decompression. Palliative percutaneous gastrostomy tube for continued decompression is warranted. This will allow the patient's to be discharged home.  EXAM: PERC PLACEMENT GASTROSTOMY  Date: 09/17/2013  PROCEDURE: 1. Fluoroscopically guided placement of percutaneous pull-through gastrostomy tube. Interventional Radiologist:  Criselda Peaches, MD  ANESTHESIA/SEDATION: Moderate (conscious) sedation was used. Two mg Versed, 100 in mcg Fentanyl were administered intravenously. The patient's vital signs were monitored continuously by radiology nursing throughout the procedure.  Sedation Time: 20 minutes  FLUOROSCOPY TIME:  2 min 36 seconds  CONTRAST:  75mL OMNIPAQUE IOHEXOL 300 MG/ML  SOLN  MEDICATIONS: 2 Ancef was administered intravenously within 1 hour of skin incision.  1 mg glucagon administered intravenously.  TECHNIQUE: Informed consent was obtained from the patient following explanation of the procedure, risks, benefits and alternatives. The patient understands, agrees and consents for the procedure. All questions were addressed. A time out was performed.  Maximal barrier sterile technique utilized including caps, mask, sterile gowns, sterile gloves, large sterile drape, hand hygiene, and chlorhexadine skin prep.  An angled catheter was advanced over a wire under fluoroscopic guidance through the nose, down the esophagus and into the body of the stomach. The stomach was then insufflated with several 100 ml of air. Fluoroscopy confirmed location of the gastric bubble, as well as inferior displacement  of the barium stained colon. Under direct fluoroscopic guidance, a single T-tack was placed, and the anterior gastric wall drawn up against the anterior abdominal wall. Percutaneous access was then obtained into the mid gastric body with an 18 gauge sheath needle. Aspiration of air, and injection of contrast material under fluoroscopy confirmed needle placement.  An Amplatz wire was advanced in the gastric body and the access needle exchanged for a 9-French vascular sheath. A snare device was advanced through the vascular sheath and an Amplatz wire advanced through the angled catheter. The Amplatz wire was successfully snared and this was pulled up through the esophagus and out the mouth. A 20-French Alinda Dooms MIC-PEG tube was then connected to the snare and pulled through the mouth, down the esophagus, into the stomach and out to the anterior abdominal wall. Hand injection of contrast material confirmed intragastric location. The T-tack retention suture was then cut. The pull through peg tube was then secured with the external bumper and capped.  The patient will be observed for several hours with the newly placed tube on low wall suction to evaluate for any post procedure complication. The patient tolerated the procedure well, there is no immediate complication.  IMPRESSION: Successful placement of a 20 French pull through gastrostomy tube.  Signed,  Criselda Peaches, MD  [facility]  [facility]   Electronically Signed   By: Jacqulynn Cadet M.D.   On: 09/17/2013 17:45   Dg Chest Port 1 View  09/09/2013   CLINICAL DATA:  Weakness and  nausea  EXAM: PORTABLE CHEST - 1 VIEW  COMPARISON:  Length 10/21/2007  FINDINGS: Dual lumen catheter is now seen on the right with the tip at the cavoatrial junction. The cardiac shadow is within normal limits. The lungs are clear. No bony abnormality is noted.  IMPRESSION: No active disease.   Electronically Signed   By:  Inez Catalina M.D.   On: 09/09/2013 09:04   Dg Abd Portable 1v  09/16/2013   CLINICAL DATA:  NG tube placement.  EXAM: PORTABLE ABDOMEN - 1 VIEW  COMPARISON:  09/15/2013  FINDINGS: Nasogastric tube is again seen with slightly bigger loop over the stomach with tip over the left upper quadrant in the region of the gastric cardia unchanged. Visualized bowel gas pattern is nonobstructive. Right IJ central venous catheter is unchanged with tip over the SVC. Remainder the exam is unchanged.  IMPRESSION: Nonobstructive bowel gas pattern.  Nasogastric tube with slightly bigger loop over the stomach and tip unchanged over the medial gastric cardia.   Electronically Signed   By: Marin Olp M.D.   On: 09/16/2013 09:42   Dg Abd Portable 1v  09/15/2013   CLINICAL DATA:  NG tube adjustment  EXAM: PORTABLE ABDOMEN - 1 VIEW  COMPARISON:  09/15/2013 at 0731 hr  FINDINGS: Enteric tube terminates in the gastric cardia.  Cholecystectomy clips.  Central venous catheter terminates at the cavoatrial junction.  Mild patchy left basilar opacity, likely atelectasis.  IMPRESSION: Enteric tube terminates in the gastric cardia.   Electronically Signed   By: Julian Hy M.D.   On: 09/15/2013 09:32   Ct Angio Abd/pel W/ And/or W/o  09/12/2013   CLINICAL DATA:  History of metastatic colon cancer (2009), post chemotherapy, post ileostomy now with nausea and vomiting and duodenal ulcer. Evaluate for source of bleed given undefined anatomy post ostomy revision.  EXAM: CT ANGIOGRAPHY ABDOMEN AND PELVIS WITH CONTRAST AND WITHOUT CONTRAST  TECHNIQUE: Multidetector CT imaging of the abdomen and pelvis was performed using the standard protocol during bolus administration of intravenous contrast. Multiplanar reconstructed images including MIPs were obtained and reviewed to evaluate the vascular anatomy.  CONTRAST:  165mL OMNIPAQUE IOHEXOL 350 MG/ML SOLN  COMPARISON:  CT of the abdomen pelvis - 09/12/2012  FINDINGS: Vascular Findings:   Abdominal aorta: Normal caliber of the abdominal aorta. No abdominal aortic dissection. Ill-defined sclerosing soft tissue within the lower abdomen/ upper pelvis abuts the anterior wall of the abdominal aorta though does not result in a hemodynamically significant stenosis.  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 While the cranial aspect of the retroperitoneal fibrosis abuts the caudal surface of the celiac artery, it does not result in a hemodynamically significant stenosis or vessel narrowing. Conventional branching pattern. There is mild hypertrophy of multiple pancreaticoduodenal arterial collaterals, providing additional arterial supply to the SMA.  SMA: The retroperitoneal fibrosis circumferentially surrounds and narrows the SMA throughout its course (image 62, series 5), resulting in a long segment at least moderate narrowing, placing the majority of the small bowel at risk for ischemia.  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 The dominant right-sided renal artery is widely patent without hemodynamically significant narrowing.  Left Renal artery: Solitary; widely patent without hemodynamically significant narrowing.  IMA: Appears occluded shortly after its origin with reconstitution from collateral supply from the SMA.  Pelvic vasculature: The bilateral common, external and  internal iliac arteries are of normal caliber and widely patent without hemodynamically significant stenosis.  Review of the MIP images confirms the above findings.   --------------------------------------------------------------------------------  Nonvascular Findings:  There is an ill defined confluent slightly nodular soft tissue mass within the retroperitoneum which is noted to inwardly retract and partially obstruct the bilateral ureters (image 97, series 2 as  well as markedly narrow the horizontal segment of the duodenum (image 80, series 2) and circumflex early in case and mildly diffusely narrow the SMA (represented image 74, series 2). These findings have all progressed since prior examination performed 10/02/2012 and worrisome for progression of retroperitoneal fibrosis.  Additionally, there has been progression of bulky pathologically enlarged mesenteric lymph nodes which have also been retracted posteriorly with index mesenteric lymph node measuring approximately 1.4 cm in greatest short axis diameter (image 97, series 2, previously, 1.2 cm in diameter. This finding is worrisome for progression of provided history of metastatic rectal cancer.  Post total colectomy. There is an apparent left lower quadrant end ileostomy as well as an apparent right lower quadrant mucous fistula. The patient has undergone an apparent low anterior resection with ill-defined residual soft tissue within the lower pelvis with associated minimal amount of adjacent fluid within the lower pelvis, presumably sequela radiation change (image 132, series 2).  There is marked narrowing of the horizontal segment of the duodenum (image 80, series 2), secondary to the cranial aspect of the ill-defined retractile retroperitoneal mass. This finding is associated with marked air and fluid distension of the stomach and proximal duodenum. No pneumoperitoneum, pneumatosis or portal venous gas.  Normal hepatic contour. There is mild diffuse decreased attenuation of the hepatic parenchyma on this postcontrast examination suggestive of hepatic steatosis. No discrete hepatic lesions Post cholecystectomy. No intra or extra hepatic biliary duct dilatation. No ascites.  There is symmetric enhancement of the bilateral kidneys. Interval development of moderate bilateral pelvicaliectasis and ureterectasis to the level of the ill-defined soft tissue mass within the posterior aspect of the retroperitoneum  (representative axial image 97, series 2). There is delayed excretion from the bilateral kidneys. There is a minimal amount of bilateral perinephric stranding. Note is made of a subcentimeter (approximately 0.8 cm) hypoattenuating lesion within the posterior aspect of the inferior pole of the left Kidney (79, series 4) which is too small to accurately characterize of favored to represent a renal cyst. No definite renal stones on this postcontrast examination.  Mild distention of the urinary bladder. No asymmetric urinary bladder wall thickening. Scattered minimal calcifications within normal size prostate gland.  Normal appearance of the bilateral adrenal glands and spleen. The pancreas is markedly atrophic.  Limited visualization of the lower thorax demonstrates minimal subsegmental atelectasis within the bilateral lower lobes and image right middle lobe. No pleural effusion.  Normal heart size. The tip of a central venous catheter terminates within the superior cavoatrial junction. No pericardial effusion.  No acute or aggressive osseous abnormalities. There is straightening expected lumbar lordosis. Mild multilevel lumbar spine DDD, worse at L4-L5 and L5-S1 with disc space height loss, endplate irregularity and sclerosis.  There is a well-healed midline abdominal incision without evidence of hernia formation. There is diffuse body wall edema, most conspicuous about the bilateral thighs and buttocks.  IMPRESSION: 1. Interval worsening of ill-defined serpiginous retractile retroperitoneal soft mass which partially obstructs the bilateral ureters (now resulting moderate bilateral pelvicaliectasis and ureterectasis) and marked narrowing of the duodenum (resulting in at least partial functional obstruction with upstream dilatation of the  proximal duodenum and stomach). 2. 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 3. The celiac artery is retracted caudally secondary to the retroperitoneal fibrosis, though remains widely patent without hemodynamically significant narrowing. The celiac artery appears to be the dominant arterial supply to the intestines with mild hypertrophy of several pancreaticoduodenal arteries providing collateral supply to the SMA distribution. No evidence of pneumatosis or portal venous gas. 4. Post LAR with ill-defined soft tissue and fluid with the lower pelvis, presumably sequela of prior radiation change though locally recurrent disease is not excluded on the basis of this examination. Worsening retroperitoneal adenopathy worrisome for progression of provided history of rectal cancer. Correlation to prior outside examinations is recommended. 5. 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 No definable/drainable fluid collection within the abdomen or pelvis. 6. Suspected hepatic steatosis.  Above findings were discussed by Dr. Markus Daft (interventional radiology) with Dr. Teena Irani at 17:38.   Electronically Signed   By: Sandi Mariscal M.D.   On: 09/12/2013 18:13    Labs:  Basic Metabolic Panel:  Recent Labs Lab 09/13/13 0355 09/14/13 0500 09/15/13 0500 09/16/13 0505 09/17/13 0458 09/18/13 0404 09/19/13 0525  NA 136* 134* 134*  --  133* 134*  --   K 3.6* 4.2 4.2  --  4.3 4.0  --   CL 102 98 99  --  97 96  --   CO2 25 24 24   --  28 27  --   GLUCOSE 156* 204* 159*  --  197* 151*  --   BUN 21 20 21   --  25* 25*  --   CREATININE 0.74 0.73 0.69  --  0.76 0.80  --   CALCIUM 7.7* 8.1* 8.1*  --  8.1* 8.1*  --   MG 1.7 1.9 1.7 1.7 1.7 1.7 1.7  PHOS 3.7 3.9 3.7  4.9* 4.2 4.1 4.6   GFR Estimated Creatinine Clearance: 101.9 ml/min (by C-G formula based on Cr of 0.8). Liver Function Tests:  Recent Labs Lab 09/13/13 0355 09/17/13 0458 09/18/13 0404  AST 19 25 21   ALT 38 63* 52  ALKPHOS 114 164* 154*  BILITOT 0.4 0.4 0.5  PROT 5.0* 5.6* 5.2*  ALBUMIN 2.1* 2.2* 2.1*   Coagulation profile  Recent Labs Lab 09/15/13 0500  INR 0.98    CBC:  Recent Labs Lab 09/15/13 0500  09/16/13 0505  09/17/13 0458 09/17/13 1015 09/17/13 1828 09/18/13 0404 09/19/13 0525  WBC 11.6*  --  14.6*  --  14.2*  --   --  14.1* 12.2*  NEUTROABS 10.3*  --  12.8*  --  12.7*  --   --  12.0* 9.8*  HGB 9.7*  < > 10.4*  < > 9.5* 9.4* 9.4* 9.6* 9.6*  HCT 28.5*  < > 30.9*  < > 29.6* 28.6* 30.5* 28.8* 30.2*  MCV 88.2  --  88.8  --  91.1  --   --  89.4 91.5  PLT 256  --  333  --  306  --   --  350 364  < > = values in this interval not displayed. Cardiac Enzymes:  Recent  Labs Lab 09/12/13 1101 09/12/13 1725 09/12/13 2251  TROPONINI <0.30 <0.30 <0.30   CBG:  Recent Labs Lab 09/17/13 2016 09/17/13 2355 09/18/13 0404 09/18/13 0749 09/18/13 1247  GLUCAP 152* 110* 151* 151* 128*   Lipid Profile  Recent Labs  09/17/13 0458  TRIG 61   Microbiology Recent Results (from the past 240 hour(s))  MRSA PCR SCREENING     Status: None   Collection Time    09/09/13 11:58 AM      Result Value Ref Range Status   MRSA by PCR NEGATIVE  NEGATIVE Final   Comment:            The GeneXpert MRSA Assay (FDA     approved for NASAL specimens     only), is one component of a     comprehensive MRSA colonization     surveillance program. It is not     intended to diagnose MRSA     infection nor to guide or     monitor treatment for     MRSA infections.  URINE CULTURE     Status: None   Collection Time    09/10/13  3:20 AM      Result Value Ref Range Status   Specimen Description URINE, RANDOM   Final   Special Requests NONE   Final   Culture  Setup Time      Final   Value: 09/10/2013 09:26     Performed at [facility]     Final   Value: 6,000 COLONIES/ML     Performed at [facility]   Culture     Final   Value: INSIGNIFICANT GROWTH     Performed at [facility]   Report Status 09/11/2013 FINAL   Final    Time coordinating discharge: 40 minutes.  Signed:  RAMA,CHRISTINA  Pager [phone] [facility] 09/19/2013, 9:58 AM

## 2013-09-19 NOTE — Progress Notes (Signed)
I wasted remains of patient's dilaudid drip. It totaled 37.5 ml. I wasted with Norberta Keens.

## 2013-09-19 NOTE — Progress Notes (Signed)
Patient was stable at discharge. Clamped his PEG Tube. Flushed and capped his PICC line. Reviewed discharge education with patient and father. Answered their questions. They verbalized understanding and had no further questions.

## 2013-11-05 ENCOUNTER — Inpatient Hospital Stay (HOSPITAL_COMMUNITY)
Admission: AD | Admit: 2013-11-05 | Discharge: 2013-11-12 | DRG: 091 | Disposition: A | Discharge status: Hospice | Payer: BC Managed Care – PPO | Source: Ambulatory Visit | Attending: Internal Medicine | Admitting: Internal Medicine

## 2013-11-05 ENCOUNTER — Encounter (HOSPITAL_COMMUNITY): Payer: Self-pay | Admitting: Internal Medicine

## 2013-11-05 DIAGNOSIS — R532 Functional quadriplegia: Secondary | ICD-10-CM | POA: Diagnosis present

## 2013-11-05 DIAGNOSIS — T40605A Adverse effect of unspecified narcotics, initial encounter: Secondary | ICD-10-CM | POA: Diagnosis present

## 2013-11-05 DIAGNOSIS — R945 Abnormal results of liver function studies: Secondary | ICD-10-CM

## 2013-11-05 DIAGNOSIS — C7952 Secondary malignant neoplasm of bone marrow: Secondary | ICD-10-CM

## 2013-11-05 DIAGNOSIS — G8929 Other chronic pain: Secondary | ICD-10-CM | POA: Diagnosis present

## 2013-11-05 DIAGNOSIS — Z515 Encounter for palliative care: Secondary | ICD-10-CM

## 2013-11-05 DIAGNOSIS — B49 Unspecified mycosis: Secondary | ICD-10-CM | POA: Diagnosis present

## 2013-11-05 DIAGNOSIS — K5090 Crohn's disease, unspecified, without complications: Secondary | ICD-10-CM | POA: Diagnosis present

## 2013-11-05 DIAGNOSIS — Z934 Other artificial openings of gastrointestinal tract status: Secondary | ICD-10-CM

## 2013-11-05 DIAGNOSIS — C787 Secondary malignant neoplasm of liver and intrahepatic bile duct: Secondary | ICD-10-CM | POA: Diagnosis present

## 2013-11-05 DIAGNOSIS — R7989 Other specified abnormal findings of blood chemistry: Secondary | ICD-10-CM | POA: Diagnosis present

## 2013-11-05 DIAGNOSIS — R112 Nausea with vomiting, unspecified: Secondary | ICD-10-CM

## 2013-11-05 DIAGNOSIS — D62 Acute posthemorrhagic anemia: Secondary | ICD-10-CM

## 2013-11-05 DIAGNOSIS — F411 Generalized anxiety disorder: Secondary | ICD-10-CM | POA: Diagnosis present

## 2013-11-05 DIAGNOSIS — N1330 Unspecified hydronephrosis: Secondary | ICD-10-CM | POA: Diagnosis present

## 2013-11-05 DIAGNOSIS — K912 Postsurgical malabsorption, not elsewhere classified: Secondary | ICD-10-CM | POA: Diagnosis present

## 2013-11-05 DIAGNOSIS — R509 Fever, unspecified: Secondary | ICD-10-CM | POA: Diagnosis present

## 2013-11-05 DIAGNOSIS — Z8249 Family history of ischemic heart disease and other diseases of the circulatory system: Secondary | ICD-10-CM

## 2013-11-05 DIAGNOSIS — R69 Illness, unspecified: Secondary | ICD-10-CM

## 2013-11-05 DIAGNOSIS — Z431 Encounter for attention to gastrostomy: Secondary | ICD-10-CM

## 2013-11-05 DIAGNOSIS — E860 Dehydration: Secondary | ICD-10-CM | POA: Diagnosis present

## 2013-11-05 DIAGNOSIS — B379 Candidiasis, unspecified: Secondary | ICD-10-CM

## 2013-11-05 DIAGNOSIS — Z794 Long term (current) use of insulin: Secondary | ICD-10-CM

## 2013-11-05 DIAGNOSIS — C20 Malignant neoplasm of rectum: Secondary | ICD-10-CM | POA: Diagnosis present

## 2013-11-05 DIAGNOSIS — K5660 Unspecified intestinal obstruction: Secondary | ICD-10-CM

## 2013-11-05 DIAGNOSIS — R1011 Right upper quadrant pain: Secondary | ICD-10-CM

## 2013-11-05 DIAGNOSIS — R2990 Unspecified symptoms and signs involving the nervous system: Secondary | ICD-10-CM | POA: Diagnosis present

## 2013-11-05 DIAGNOSIS — Z9049 Acquired absence of other specified parts of digestive tract: Secondary | ICD-10-CM

## 2013-11-05 DIAGNOSIS — Z79899 Other long term (current) drug therapy: Secondary | ICD-10-CM

## 2013-11-05 DIAGNOSIS — R601 Generalized edema: Secondary | ICD-10-CM | POA: Diagnosis present

## 2013-11-05 DIAGNOSIS — N179 Acute kidney failure, unspecified: Secondary | ICD-10-CM

## 2013-11-05 DIAGNOSIS — R609 Edema, unspecified: Secondary | ICD-10-CM

## 2013-11-05 DIAGNOSIS — G253 Myoclonus: Principal | ICD-10-CM | POA: Diagnosis present

## 2013-11-05 DIAGNOSIS — C799 Secondary malignant neoplasm of unspecified site: Secondary | ICD-10-CM

## 2013-11-05 DIAGNOSIS — F22 Delusional disorders: Secondary | ICD-10-CM | POA: Diagnosis present

## 2013-11-05 DIAGNOSIS — C7951 Secondary malignant neoplasm of bone: Secondary | ICD-10-CM

## 2013-11-05 DIAGNOSIS — D638 Anemia in other chronic diseases classified elsewhere: Secondary | ICD-10-CM | POA: Diagnosis present

## 2013-11-05 DIAGNOSIS — Z932 Ileostomy status: Secondary | ICD-10-CM

## 2013-11-05 DIAGNOSIS — B377 Candidal sepsis: Secondary | ICD-10-CM

## 2013-11-05 DIAGNOSIS — Z9089 Acquired absence of other organs: Secondary | ICD-10-CM

## 2013-11-05 DIAGNOSIS — Z6825 Body mass index (BMI) 25.0-25.9, adult: Secondary | ICD-10-CM

## 2013-11-05 DIAGNOSIS — G9340 Encephalopathy, unspecified: Secondary | ICD-10-CM | POA: Diagnosis present

## 2013-11-05 DIAGNOSIS — E43 Unspecified severe protein-calorie malnutrition: Secondary | ICD-10-CM | POA: Diagnosis present

## 2013-11-05 DIAGNOSIS — G893 Neoplasm related pain (acute) (chronic): Secondary | ICD-10-CM | POA: Diagnosis present

## 2013-11-05 DIAGNOSIS — Z66 Do not resuscitate: Secondary | ICD-10-CM

## 2013-11-05 LAB — COMPREHENSIVE METABOLIC PANEL
ALK PHOS: 270 U/L — AB (ref 39–117)
ALT: 63 U/L — ABNORMAL HIGH (ref 0–53)
AST: 49 U/L — AB (ref 0–37)
Albumin: 2.4 g/dL — ABNORMAL LOW (ref 3.5–5.2)
Anion gap: 16 — ABNORMAL HIGH (ref 5–15)
BILIRUBIN TOTAL: 0.6 mg/dL (ref 0.3–1.2)
BUN: 30 mg/dL — ABNORMAL HIGH (ref 6–23)
CHLORIDE: 108 meq/L (ref 96–112)
CO2: 24 mEq/L (ref 19–32)
Calcium: 9.8 mg/dL (ref 8.4–10.5)
Creatinine, Ser: 1.15 mg/dL (ref 0.50–1.35)
GFR calc Af Amer: 79 mL/min — ABNORMAL LOW (ref >90)
GFR calc non Af Amer: 68 mL/min — ABNORMAL LOW (ref >90)
Glucose, Bld: 138 mg/dL — ABNORMAL HIGH (ref 70–99)
POTASSIUM: 4.1 meq/L (ref 3.7–5.3)
Sodium: 148 mEq/L — ABNORMAL HIGH (ref 137–147)
Total Protein: 6.6 g/dL (ref 6.0–8.3)

## 2013-11-05 LAB — CBC WITH DIFFERENTIAL/PLATELET
Basophils Absolute: 0 10*3/uL (ref 0.0–0.1)
Basophils Relative: 0 % (ref 0–1)
Eosinophils Absolute: 0 10*3/uL (ref 0.0–0.7)
Eosinophils Relative: 0 % (ref 0–5)
HCT: 31.5 % — ABNORMAL LOW (ref 39.0–52.0)
HEMOGLOBIN: 9.7 g/dL — AB (ref 13.0–17.0)
LYMPHS PCT: 7 % — AB (ref 12–46)
Lymphs Abs: 0.4 10*3/uL — ABNORMAL LOW (ref 0.7–4.0)
MCH: 29.9 pg (ref 26.0–34.0)
MCHC: 30.8 g/dL (ref 30.0–36.0)
MCV: 97.2 fL (ref 78.0–100.0)
MONO ABS: 0.3 10*3/uL (ref 0.1–1.0)
Monocytes Relative: 5 % (ref 3–12)
Neutro Abs: 5.4 10*3/uL (ref 1.7–7.7)
Neutrophils Relative %: 88 % — ABNORMAL HIGH (ref 43–77)
PLATELETS: 235 10*3/uL (ref 150–400)
RBC: 3.24 MIL/uL — ABNORMAL LOW (ref 4.22–5.81)
RDW: 16.6 % — ABNORMAL HIGH (ref 11.5–15.5)
WBC: 6.2 10*3/uL (ref 4.0–10.5)

## 2013-11-05 LAB — MRSA PCR SCREENING: MRSA by PCR: NEGATIVE

## 2013-11-05 LAB — MAGNESIUM: Magnesium: 2 mg/dL (ref 1.5–2.5)

## 2013-11-05 LAB — PHOSPHORUS: Phosphorus: 4.3 mg/dL (ref 2.3–4.6)

## 2013-11-05 MED ORDER — DIAZEPAM 5 MG/ML IJ SOLN: 5.0000 mg | Freq: Four times a day (QID) | INTRAMUSCULAR | Status: DC | PRN

## 2013-11-05 MED ORDER — SENNA 8.6 MG PO TABS
1.0000 | ORAL_TABLET | Freq: Two times a day (BID) | ORAL | Status: DC
  Administered 2013-11-06 – 2013-11-08 (×5): 8.6 mg via ORAL
  Filled 2013-11-05 (×5): qty 1

## 2013-11-05 MED ORDER — SODIUM CHLORIDE 0.9 % IV SOLN
300.0000 ug/h | INTRAVENOUS | Status: DC
  Administered 2013-11-05 – 2013-11-11 (×13): 250 ug/h via INTRAVENOUS
  Filled 2013-11-05 (×16): qty 50

## 2013-11-05 MED ORDER — HALOPERIDOL LACTATE 5 MG/ML IJ SOLN: 2.0000 mg | Freq: Four times a day (QID) | INTRAMUSCULAR | Status: DC | PRN

## 2013-11-05 MED ORDER — DEXAMETHASONE 4 MG PO TABS
4.0000 mg | ORAL_TABLET | Freq: Every day | ORAL | Status: DC
  Administered 2013-11-06 – 2013-11-11 (×6): 4 mg via ORAL
  Filled 2013-11-05 (×7): qty 1

## 2013-11-05 MED ORDER — PANTOPRAZOLE SODIUM 40 MG PO TBEC
40.0000 mg | DELAYED_RELEASE_TABLET | Freq: Two times a day (BID) | ORAL | Status: DC
  Administered 2013-11-05 – 2013-11-11 (×13): 40 mg via ORAL
  Filled 2013-11-05 (×18): qty 1

## 2013-11-05 MED ORDER — SCOPOLAMINE 1 MG/3DAYS TD PT72
1.0000 | MEDICATED_PATCH | TRANSDERMAL | Status: DC
  Filled 2013-11-05: qty 1

## 2013-11-05 MED ORDER — DIAZEPAM 5 MG PO TABS: 5.0000 mg | ORAL_TABLET | Freq: Four times a day (QID) | ORAL | Status: DC | PRN

## 2013-11-05 MED ORDER — ACETAMINOPHEN 650 MG RE SUPP: 650.0000 mg | Freq: Four times a day (QID) | RECTAL | Status: DC | PRN

## 2013-11-05 MED ORDER — ONDANSETRON HCL 4 MG PO TABS: 4.0000 mg | ORAL_TABLET | Freq: Four times a day (QID) | ORAL | Status: DC | PRN

## 2013-11-05 MED ORDER — DOBELLS MT SOLN
15.0000 mL | Freq: Two times a day (BID) | OROMUCOSAL | Status: DC
  Administered 2013-11-05 – 2013-11-12 (×2): 15 mL via OROMUCOSAL

## 2013-11-05 MED ORDER — FENTANYL CITRATE 0.05 MG/ML IJ SOLN
200.0000 ug | INTRAMUSCULAR | Status: DC | PRN
  Administered 2013-11-10: 200 ug via INTRAVENOUS

## 2013-11-05 MED ORDER — PROMETHAZINE HCL 25 MG PO TABS
25.0000 mg | ORAL_TABLET | Freq: Four times a day (QID) | ORAL | Status: DC | PRN
  Administered 2013-11-06 – 2013-11-11 (×6): 25 mg via ORAL
  Filled 2013-11-05 (×7): qty 1

## 2013-11-05 MED ORDER — ENOXAPARIN SODIUM 40 MG/0.4ML ~~LOC~~ SOLN
40.0000 mg | SUBCUTANEOUS | Status: DC
  Administered 2013-11-05 – 2013-11-09 (×4): 40 mg via SUBCUTANEOUS
  Filled 2013-11-05 (×6): qty 0.4

## 2013-11-05 MED ORDER — SODIUM CHLORIDE 0.9 % IV SOLN
INTRAVENOUS | Status: DC
  Administered 2013-11-05 – 2013-11-06 (×2): 1000 mL via INTRAVENOUS
  Administered 2013-11-06 – 2013-11-09 (×7): via INTRAVENOUS

## 2013-11-05 MED ORDER — DIAZEPAM 5 MG/ML IJ SOLN
5.0000 mg | INTRAMUSCULAR | Status: DC | PRN
  Administered 2013-11-05 – 2013-11-11 (×7): 5 mg via INTRAVENOUS
  Filled 2013-11-05 (×9): qty 2

## 2013-11-05 MED ORDER — TAMSULOSIN HCL 0.4 MG PO CAPS
0.4000 mg | ORAL_CAPSULE | Freq: Every day | ORAL | Status: DC
  Administered 2013-11-05 – 2013-11-10 (×6): 0.4 mg via ORAL
  Filled 2013-11-05 (×7): qty 1

## 2013-11-05 MED ORDER — ONDANSETRON HCL 4 MG/2ML IJ SOLN
4.0000 mg | Freq: Four times a day (QID) | INTRAMUSCULAR | Status: DC | PRN
  Administered 2013-11-06: 4 mg via INTRAVENOUS
  Filled 2013-11-05: qty 2

## 2013-11-05 MED ORDER — FENTANYL CITRATE 0.05 MG/ML IJ SOLN: 200.0000 ug | INTRAMUSCULAR | Status: DC | PRN

## 2013-11-05 MED ORDER — ACETAMINOPHEN 325 MG PO TABS
650.0000 mg | ORAL_TABLET | Freq: Four times a day (QID) | ORAL | Status: DC | PRN
  Administered 2013-11-05 – 2013-11-07 (×2): 650 mg via ORAL
  Filled 2013-11-05 (×2): qty 2

## 2013-11-05 NOTE — Progress Notes (Signed)
Dilaudid wasted from pt's pain pump was 60 ml.

## 2013-11-05 NOTE — Progress Notes (Signed)
Dilaudid pain machine dc'd, father to take machine home. Fentanyl gtt started/per hickman. Pt has shaking chills VS-Bp-109/68 T-102.9 Hr-150 O2 sat-77%. O2 2 L via [place], sat-98%. Dr Rama notified, blood cultures to be drawn.

## 2013-11-05 NOTE — Consult Note (Addendum)
Patient Brandon [name]      DOB: Jun 11, 1955      PPJ:[id number]     Consult Note from the Palliative Medicine Team at [facility] Requested by: Brandon [name]     PCP: Brandon Screws, MD Reason for Consultation:Symptom Management    Phone Number:[phone]  Assessment/Recommendations:  58 yo male with Crohns, metastatic rectal CA with progressive disease s/p multiple GI resections with resultant short gut syndrome necessitating TPN.    1.  Code Status: DNR, I have not discussed this with Brandon [name], but certainly appropriate given advanced nature of his cancer  2. Goals of Care: Will need to see how Brandon [name] does with treatment of his suspected OIN.  I spoke quite a bit with his father today about serious nature of OIN and how I don't think this current plan can safely work at home. Brandon [name] is well known to me from [facility] and this has been ongoing issue. I have had many conversations with his primary Oncologist, Brandon [name], at [facility].  We both have felt that Hospice care would be beneficial for him and give him the monitoring he needs to handle his extremely high doses of pain medicine.  He has been maintained on I believe bi-weekly 5-FU for multiple months at [facility] with home infusions. He has been off this since June when he was admitted here for GI Bleed.  Brandon [name] some him few weeks ago and again recommended aggressive symptom management.  In speaking with his dad today, he agrees that it may be time to shift towards hospice care. He would like Brandon [name] to be able to participate in this conversation as well, hopefully after improving here.  Brandon [name] was actually on hospice at one point many months ago at the same time as his mom.  He has had fears about going over to hospice care because his mother died quickly after enrolling, and they felt she "gave-up" once hospice was started.  I have contacted his primary Oncologist Brandon [name] to make aware of admission.   3. Symptom  Management:   1. Acute Encephalopathy- In setting of myoclonic jerks, confusion, somnolence with AKI and ongoing high dose opioids.  Not really on any medications that would be concerning for neuroleptic malignant syndrome.  Does not seem consistent with serotonin syndrome (no inducible clonus on exam).  I thin most likely is opioid induced neurtoxicity from dilaudid infusion. Has had long standing myoclonic jerks from opioids which I was concerned about when he saw me at my [facility] in June.  It seems that attempts to wean down opioids were unsuccesful. AKI at home may have precipitated this as well though difficult to say which came first.  His myoclonus, somnolence, confusion certainyl fit with this picture.  Aggressive IVF hydration, benzo's (Valium 5mg  q4h PRN) , opioid rotation as below  2. Cancer related pain- concern for OIN as above.  Was on fentanyl 372mcg patch as well as dilaudid 4mg /hr basal rate and bolus of 1mg . Converting his basal dilaudid to MEDD gives ~1900mg  morphine.  Will rotate to fentanyl and reduce dose for incomplete cross tolerance by 50%.  This would work out to fentanyl 269mcg/hr.  I will also have them take off 314mcg/hr patches (not sure of his absorption as I am sure Brandon [name] has very poor lipid stores.  Current pain regimen will be fentanyl 248mcg/hr and bolus of 163mcg q85min PRN.  If doing worse, would also have to throw serotonin syndrome in  differential which can be caused by Fentanyl. Time course does not fit with serotonin syndrome well either (usually occurs over 24h vs progressive worsening over weeks with [name]). Would continue decadron as well.    4. AKI: Unclear cause. Possible intervascular depletion. Brandon [name] certainly is 3rd spacing fluids. IVF hydration as above. I do believe he has a history of hydronephrosis as well which we may need to worry about if his renal function worsens.   5. Psychosocial/Spiritual: Brandon [name] lives in [place], [place]  with his dad. His father, Brandon [name], is his HCPOA though we do not have this official documentation. Brandon [name] lost his motherto advanced metastatic ovarian cancer. He had actually enrolled in hospice partly because they took such good care of his mother.    Brief HPI: Brandon [name] is a 58 yo male with PMHx of Crohns disease, metastatic rectal CA s/p multiple GI resections and resultant short gut syndrome, difficult to control cancer related pain. His primary Oncologist is Brandon [name] at [facility].  He is well known to me from my previous Oncology Palliative Care clinic at [facility] where he was one of my patients.  He has had long standing history of difficult to control cancer related pain mostly in his abdomen and his back.  He had been seeing Brandon Beverlyn Roux with Anesthesia/Pain who had maintained him on Fentanyl patch and dilaudid PCA with 0.4mg  bolus dose at one time I believe. After he was admitted to [facility] with GI bleed and severe pain, he ended up on Dilaudid PCA at 4mg /hr with 1mg  bolus dose PRN in addition to Fentanyl patch at 372mcg/hr q3days.  Oral medications have been ineffective in his pain control with suspicion for poor absorption due to short gut.  When I saw Brandon [name] a little over a month ago at my [facility] clinic, he had concerning signs for OIN at that time including somnolence and myoclonic jerks. They did however feel that his pain had been much better controlled.  He was on maintenance 5-FU for palliative chemo, but this has been stopped since admit for GI bleed in early June.  He has been on TPN for appx 1 year but able to tolerate small amounts of oral food, sometimes which comes quickly out his PEG .   Over past few weeks, Brandon [name] has had progressive decline per his dad Brandon [name].  Brandon [name] reports that Brandon [name] has become increasingly drowsy and more confused. There was some thought that diazepm could be playing a role and a provider switched him to lorazepam 1mg  PRN instead. He continued to have  progressive somnolence and confusion with chills at home.  This rapidly precipitated over weekend. Brandon [name] called Brandon Hilma Favors to inform her of Brandon [name]'s condition and she appropriately arranged for direct admission to hospital.  Brandon [name] has noted these myoclonic jerks for so long that it is difficult for him to tell how much worse they are.  They appear significantly worse than when I saw him a little over 1 month ago. Brandon [name] is currently unable to provide any history.   OIZ:TIWPYK to obtain 2/2 cognitive impairment    PMH:  Past Medical History  Diagnosis Date  . Biliary dyskinesia   . Bowel obstruction   . Ulcerative colitis   . Allergy   . Depression   . Ileostomy in place   . ED (erectile dysfunction)   . Benign positional vertigo   . Prolactinoma   . Hypogonadism male   . History of blood transfusion   .  Pituitary tumor 2009  . 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 Colon surgery  2003    Colectomy with ileostomy  . Rectal surgery  2009    Resection of rectal pouch  . Knee arthroscopy    . Cholecystectomy  04/26/2012    Procedure: LAPAROSCOPIC CHOLECYSTECTOMY;  Surgeon: Leighton Ruff, MD;  Location: [facility];  Service: General;  Laterality: N/A;  . Eus N/A 07/05/2012    Procedure: ESOPHAGEAL ENDOSCOPIC ULTRASOUND (EUS) RADIAL;  Surgeon: Arta Silence, MD;  Location: [facility] END[facility];  Service: Endoscopy;  Laterality: N/A;  celiac block  . Neurolytic celiac plexus N/A 07/05/2012    Procedure: NEUROLYTIC CELIAC PLEXUS;  Surgeon: Arta Silence, MD;  Location: [facility];  Service: Endoscopy;  Laterality: N/A;  . Esophagogastroduodenoscopy N/A 09/10/2013    Procedure: ESOPHAGOGASTRODUODENOSCOPY (EGD);  Surgeon: Missy Sabins, MD;  Location: [facility];  Service: Endoscopy;  Laterality: N/A;   I have reviewed the [place] [facility] and  If appropriate update it with new information. Allergies  Allergen Reactions  . Codeine Nausea And  Vomiting  . Effexor [Venlafaxine Hcl] Other (See Comments)    Flat Feeling  . Imuran [Azathioprine] Nausea And Vomiting   Scheduled Meds: . [START ON 11/06/2013] dexamethasone  4 mg Oral Q breakfast  . DOBELLS  15 mL Mouth/Throat BID  . enoxaparin (LOVENOX) injection  40 mg Subcutaneous Q24H  . pantoprazole  40 mg Oral BID  . [START ON 11/06/2013] scopolamine  1 patch Transdermal Q72H  . senna  1 tablet Oral BID  . tamsulosin  0.4 mg Oral QHS   Continuous Infusions: . sodium chloride 1,000 mL (11/05/13 1545)  . fentaNYL infusion INTRAVENOUS 250 mcg/hr (11/05/13 1608)   PRN Meds:.acetaminophen, acetaminophen, diazepam, fentaNYL, haloperidol lactate, ondansetron (ZOFRAN) IV, ondansetron, promethazine    BP 125/82  Pulse 154  Temp(Src) 98.7 F (37.1 C) (Axillary)  Resp 18  Ht 5\' 7"  (1.702 m)  Wt 73.392 kg (161 lb 12.8 oz)  BMI 25.34 kg/m2  SpO2 100%   PPS: 40   Intake/Output Summary (Last 24 hours) at 11/05/13 2106 Last data filed at 11/05/13 1900  Gross per 24 hour  Intake 396.67 ml  Output    300 ml  Net  96.67 ml    Physical Exam:  General: confused,drowsy HEENT:  [place], sclera anicteric, dry mm Chest:  CTAB, symm exp CVS: Tachy, regular Abdomen: NT, + Ostomy, +PEG Ext: lower ext edema Neuro: disoriented, can follow some commands, hyper-reflexia, no inducible myoclonus but myoclonic jerks Skin: warm/dry  Labs: CBC    Component Value Date/Time   WBC 6.2 11/05/2013 1350   RBC 3.24* 11/05/2013 1350   HGB 9.7* 11/05/2013 1350   HCT 31.5* 11/05/2013 1350   PLT 235 11/05/2013 1350   MCV 97.2 11/05/2013 1350   MCH 29.9 11/05/2013 1350   MCHC 30.8 11/05/2013 1350   RDW 16.6* 11/05/2013 1350   LYMPHSABS 0.4* 11/05/2013 1350   MONOABS 0.3 11/05/2013 1350   EOSABS 0.0 11/05/2013 1350   BASOSABS 0.0 11/05/2013 1350    BMET    Component Value Date/Time   NA 148* 11/05/2013 1350   K 4.1 11/05/2013 1350   CL 108 11/05/2013 1350   CO2 24 11/05/2013 1350   GLUCOSE 138*  11/05/2013 1350   BUN 30* 11/05/2013 1350   CREATININE 1.15 11/05/2013 1350   CALCIUM 9.8 11/05/2013 1350   GFRNONAA 68* 11/05/2013 1350  GFRAA 79* 11/05/2013 1350    CMP     Component Value Date/Time   NA 148* 11/05/2013 1350   K 4.1 11/05/2013 1350   CL 108 11/05/2013 1350   CO2 24 11/05/2013 1350   GLUCOSE 138* 11/05/2013 1350   BUN 30* 11/05/2013 1350   CREATININE 1.15 11/05/2013 1350   CALCIUM 9.8 11/05/2013 1350   PROT 6.6 11/05/2013 1350   ALBUMIN 2.4* 11/05/2013 1350   AST 49* 11/05/2013 1350   ALT 63* 11/05/2013 1350   ALKPHOS 270* 11/05/2013 1350   BILITOT 0.6 11/05/2013 1350   GFRNONAA 68* 11/05/2013 1350   GFRAA 79* 11/05/2013 1350     Time In: 200 Time Out: 400 Total Time: 120 minutes  Greater than 50%  of this time was spent counseling and coordinating care related to the above assessment and plan. Including discussion with pharmacy, Brandon Rockne Menghini.    Doran Clay D.O. Palliative Medicine Team at [facility]  Pager: [phone] Team Phone: [phone]

## 2013-11-05 NOTE — Progress Notes (Signed)
[facility]  Patient Status: Active (receiving services up to time of hospitalization)  [facility] is providing the following services: RN, PT, Home Infusion (TPN and Dilaudid)  If patient discharges after hours, please call [phone].   Brandon [name] 11/05/2013, 3:13 PM

## 2013-11-05 NOTE — Progress Notes (Signed)
Father brought ostomy bags from home. Pt's bag leaking green bile. Stoma red, area around stoma reddened. Skin prep done before bag applied. Dressing changed to peg tube and small ostomy bag changed RLQ. Peg tube remains clamped. Father remains at bedside . Bed alarm on.

## 2013-11-05 NOTE — H&P (Signed)
History and Physical:    Brandon [name]:[id number] DOB: 21-Mar-1956 DOA: 11/05/2013  Referring physician: Dr. Rhea Pink PCP: [name], MD   Chief Complaint: Uncontrolled pain, opiate toxicity  History of Present Illness:   Brandon [name] is an 58 y.o. male with a PMH of metastatic rectal cancer treated with 14 cycles of 5-FU, currently under the care of Dr. Marcell Anger (oncology) at [facility] [phone], recent hospitalization for GI bleed, performance status of 3, last seen by his oncologist on 10/24/13 with recommendations for symptom management given his overall frail state. His oncologist attempted to discuss his CODE STATUS with him at that visit, however he did not want to engage in a conversation about this at that time. He has had problems with symptom management including issues related to nausea, anxiety, and pain. He was referred as a direct admission by his palliative care doctor, Dr. Hilma Favors, for symptom management and concerns for opiate toxicity.  The patient has become increasingly weak and has developed myoclonic jerking concerning for neurotoxic effects from dilaudid. The patient is unable to provide any significant history, and most of the history is obtained from the patient's father, who claims he has had myoclonic jerking for about the past year, but that he has become increasingly somnolent. He has occasional nausea/vomiting if his venting PEG tube is not drained consistently, and over the past several days, has developed a lack of coordination and a disturbance in his gait. His pain is currently being managed with a home infusion pump of high doses of Dilaudid-HP and fentanyl patches.   ROS:   Constitutional: + low grade fever, no chills;  No oral intake (on TPN); + weight loss, + fatigue.  HEENT: No blurry vision, no diplopia, no pharyngitis, no dysphagia CV: No chest pain, no palpitations, no PND, no orthopnea, no edema.  Resp: No SOB, no cough, no  pleuritic pain. GI: + nausea and intermittant vomiting (if venting PEG not drained in a timely manner), no diarrhea, no melena, no hematochezia, no constipation, + abdominal pain.  GU: No dysuria, no hematuria, no frequency, no urgency. MSK: no myalgias, + back arthralgias.  Neuro:  No headache, no focal neurological deficits, no history of seizures, +gait disturbance/lack of coordination.  Psych: No depression, no anxiety.  Endo: No heat intolerance, no cold intolerance, no polyuria, no polydipsia  Skin: No rashes, no skin lesions.  Heme: No easy bruising.  Travel history: No recent travel.   Past Medical History:   Past Medical History  Diagnosis Date  . Biliary dyskinesia   . Bowel obstruction   . Ulcerative colitis   . Allergy   . Depression   . Ileostomy in place   . ED (erectile dysfunction)   . Benign positional vertigo   . Prolactinoma   . Hypogonadism male   . History of blood transfusion   . Pituitary tumor 2009  . Cancer 2009    Rectal/ rectal pouch    Past Surgical History:   Past Surgical History  Procedure Laterality Date  . Colon surgery  2003    Colectomy with ileostomy  . Rectal surgery  2009    Resection of rectal pouch  . Knee arthroscopy    . Cholecystectomy  04/26/2012    Procedure: LAPAROSCOPIC CHOLECYSTECTOMY;  Surgeon: Leighton Ruff, MD;  Location: [facility];  Service: General;  Laterality: N/A;  . Eus N/A 07/05/2012    Procedure: ESOPHAGEAL ENDOSCOPIC ULTRASOUND (EUS) RADIAL;  Surgeon: Arta Silence, MD;  Location: [facility];  Service: Endoscopy;  Laterality: N/A;  celiac block  . Neurolytic celiac plexus N/A 07/05/2012    Procedure: NEUROLYTIC CELIAC PLEXUS;  Surgeon: Arta Silence, MD;  Location: [facility];  Service: Endoscopy;  Laterality: N/A;  . Esophagogastroduodenoscopy N/A 09/10/2013    Procedure: ESOPHAGOGASTRODUODENOSCOPY (EGD);  Surgeon: Missy Sabins, MD;  Location: [facility];  Service: Endoscopy;  Laterality: N/A;    Social History:    History   Social History  . Marital Status: Single    Spouse Name: N/A    Number of Children: 0  . Years of Education: N/A   Occupational History  . Real Estate Management    Social History Main Topics  . Smoking status: Never Smoker   . Smokeless tobacco: Never Used  . Alcohol Use: No  . Drug Use: No  . Sexual Activity: No   Other Topics Concern  . Not on file   Social History Narrative   Never married.  Lives with parents.      Family history:   Family History  Problem Relation Age of Onset  . Cancer Mother     Ovarian  . Ulcerative colitis Mother   . Hypertension Father     Allergies   Codeine; Effexor; and Imuran  Current Medications:   Prior to Admission medications   Medication Sig Start Date End Date Taking? Authorizing Provider  acetaminophen (TYLENOL) 500 MG tablet Take 500 mg by mouth every 4 (four) hours as needed for pain.     Historical Provider, MD  dexamethasone (DECADRON) 4 MG tablet Take 1 tablet (4 mg total) by mouth daily with breakfast. Clamp PEG at least 30 minutes after taking 09/18/13   Acquanetta Chain, DO  diazepam (VALIUM) 5 MG tablet Take 1 tablet (5 mg total) by mouth every 6 (six) hours as needed for anxiety, muscle spasms or sedation. Clamp tube for 30 minutes after administration 09/18/13   Acquanetta Chain, DO  fentaNYL (DURAGESIC - DOSED MCG/HR) 100 MCG/HR Place 300 mcg onto the skin every 3 (three) days. Patient has three 189mcg patches on at all times but staggers their application so he changes one patch each day.    Historical Provider, MD  fentaNYL (DURAGESIC - DOSED MCG/HR) 100 MCG/HR Place 1 patch (100 mcg total) onto the skin every 3 (three) days. 09/18/13   Acquanetta Chain, DO  fentaNYL (DURAGESIC - DOSED MCG/HR) 100 MCG/HR Place 1 patch (100 mcg total) onto the skin every 3 (three) days. 09/18/13   Acquanetta Chain, DO  fentaNYL (DURAGESIC - DOSED MCG/HR) 100 MCG/HR Place 1 patch (100 mcg total) onto the skin every 3  (three) days. 09/18/13   Acquanetta Chain, DO  HYDROmorphone 200 mg in sodium chloride 0.9 % 80 mL Inject 4 mg/hr into the vein continuous. 4mg /hr continuous infusion BASAL RATE with PCA bolus dose 2mg  every 30 minutes prn for breakthrough pain with no lock out dose. 09/18/13   Acquanetta Chain, DO  Mouthwashes [facility]) SOLN Use as directed 15 mLs in the mouth or throat 2 (two) times daily. 09/18/13   Acquanetta Chain, DO  ondansetron (ZOFRAN-ODT) 8 MG disintegrating tablet Take 8 mg by mouth every 8 (eight) hours as needed for nausea or vomiting.    Historical Provider, MD  pantoprazole (PROTONIX) 40 MG tablet Take 1 tablet (40 mg total) by mouth 2 (two) times daily. 09/18/13   [place]     Historical Provider,  MD  promethazine (PHENERGAN) 25 MG tablet Take 25 mg by mouth every 6 (six) hours as needed for nausea.    Historical Provider, MD  scopolamine (TRANSDERM-SCOP) 1 MG/3DAYS Place 1 patch (1.5 mg total) onto the skin every 3 (three) days. 09/18/13   Acquanetta Chain, DO  tamsulosin (FLOMAX) 0.4 MG CAPS capsule Take 0.4 mg by mouth at bedtime.    Historical Provider, MD    Physical Exam:   Filed Vitals:   11/05/13 1245 11/05/13 1500  BP: 125/82   Pulse: 154   Temp: 98.7 F (37.1 C)   TempSrc: Axillary   Resp: 20 18  Height: 5\' 7"  (1.702 m)   Weight: 73.392 kg (161 lb 12.8 oz)   SpO2: 100%      Physical Exam: Blood pressure 125/82, pulse 154, temperature 98.7 F (37.1 C), temperature source Axillary, resp. rate 18, height 5\' 7"  (1.702 m), weight 73.392 kg (161 lb 12.8 oz), SpO2 100.00%. Gen: No acute distress. Lethargic/somnolent with myoclonic jerking. Head: Normocephalic, atraumatic. Eyes: PERRL, EOMI, sclerae nonicteric. Mouth: Oropharynx clear. Neck: Supple, no thyromegaly, no lymphadenopathy, no jugular venous distention. Chest: Lungs diminished in the bases. CV: Heart sounds are tachycardic with no appreciable murmurs, rubs, or  gallops. Abdomen: Soft, nontender, nondistended with normal active bowel sounds. Extremities: Extremities show 3+ pitting edema bilaterally. Skin: Warm and dry. Neuro: Somnolent; cranial nerves II through XII grossly intact. Psych: Mood and affect depressed/flat.   Data Review:    Labs: Basic Metabolic Panel:  Recent Labs Lab 11/05/13 1350  NA 148*  K 4.1  CL 108  CO2 24  GLUCOSE 138*  BUN 30*  CREATININE 1.15  CALCIUM 9.8  MG 2.0  PHOS 4.3   Liver Function Tests:  Recent Labs Lab 11/05/13 1350  AST 49*  ALT 63*  ALKPHOS 270*  BILITOT 0.6  PROT 6.6  ALBUMIN 2.4*   CBC:  Recent Labs Lab 11/05/13 1350  WBC 6.2  NEUTROABS 5.4  HGB 9.7*  HCT 31.5*  MCV 97.2  PLT 235   Radiographic Studies: No results found.    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  Dilaudid-HP drip discontinued, now on fentanyl for pain control.  Active Problems:   Abdominal pain, chronic, right upper quadrant / cancer related pain   Pain management per palliative care team.     Functional quadriplegia   Reassess when sedating effects of high-dose opiates wear off.    Protein-calorie malnutrition, severe   Hold TNA for now.    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 Palliative care following.    Dehydration   Hydrate with normal saline.    Elevated LFTs  Likely related to liver and bone metastasis.     Anemia of chronic disease  No current indication for transfusion.     DVT prophylaxis   Lovenox ordered.  Code Status: DNR Family Communication: Father at bedside. Disposition Plan: Home when stable.  Time spent: 1 hour.  RAMA,CHRISTINA [facility] Pager [phone] Cell: [phone]   If 7PM-7AM, please contact night-coverage [email or web address] Password [email or web address] 11/05/2013, 5:16 PM    **Disclaimer: This note was  dictated with voice recognition software. Similar sounding words can inadvertently be transcribed and this note may contain transcription errors which may not have been corrected upon publication of note.**

## 2013-11-06 ENCOUNTER — Inpatient Hospital Stay (HOSPITAL_COMMUNITY): Payer: BC Managed Care – PPO

## 2013-11-06 DIAGNOSIS — N179 Acute kidney failure, unspecified: Secondary | ICD-10-CM

## 2013-11-06 DIAGNOSIS — D638 Anemia in other chronic diseases classified elsewhere: Secondary | ICD-10-CM

## 2013-11-06 DIAGNOSIS — Z515 Encounter for palliative care: Secondary | ICD-10-CM

## 2013-11-06 DIAGNOSIS — R112 Nausea with vomiting, unspecified: Secondary | ICD-10-CM

## 2013-11-06 DIAGNOSIS — N1330 Unspecified hydronephrosis: Secondary | ICD-10-CM | POA: Diagnosis present

## 2013-11-06 DIAGNOSIS — R509 Fever, unspecified: Secondary | ICD-10-CM | POA: Diagnosis present

## 2013-11-06 LAB — BASIC METABOLIC PANEL
Anion gap: 13 (ref 5–15)
BUN: 24 mg/dL — AB (ref 6–23)
CALCIUM: 7.7 mg/dL — AB (ref 8.4–10.5)
CO2: 22 mEq/L (ref 19–32)
CREATININE: 1.16 mg/dL (ref 0.50–1.35)
Chloride: 112 mEq/L (ref 96–112)
GFR calc Af Amer: 78 mL/min — ABNORMAL LOW (ref >90)
GFR, EST NON AFRICAN AMERICAN: 68 mL/min — AB (ref >90)
GLUCOSE: 126 mg/dL — AB (ref 70–99)
POTASSIUM: 3.5 meq/L — AB (ref 3.7–5.3)
Sodium: 147 mEq/L (ref 137–147)

## 2013-11-06 LAB — URINALYSIS, ROUTINE W REFLEX MICROSCOPIC
BILIRUBIN URINE: NEGATIVE
Glucose, UA: NEGATIVE mg/dL
KETONES UR: NEGATIVE mg/dL
Leukocytes, UA: NEGATIVE
Nitrite: NEGATIVE
PROTEIN: 30 mg/dL — AB
SPECIFIC GRAVITY, URINE: 1.02 (ref 1.005–1.030)
UROBILINOGEN UA: 0.2 mg/dL (ref 0.0–1.0)
pH: 5.5 (ref 5.0–8.0)

## 2013-11-06 LAB — GLUCOSE, CAPILLARY: Glucose-Capillary: 309 mg/dL — ABNORMAL HIGH (ref 70–99)

## 2013-11-06 LAB — URINE MICROSCOPIC-ADD ON

## 2013-11-06 MED ORDER — CLONAZEPAM 0.5 MG PO TBDP
0.5000 mg | ORAL_TABLET | Freq: Two times a day (BID) | ORAL | Status: DC
  Administered 2013-11-06 – 2013-11-07 (×3): 0.5 mg via ORAL
  Filled 2013-11-06 (×3): qty 1

## 2013-11-06 MED ORDER — INSULIN ASPART 100 UNIT/ML ~~LOC~~ SOLN
0.0000 [IU] | Freq: Four times a day (QID) | SUBCUTANEOUS | Status: DC
  Administered 2013-11-06 – 2013-11-07 (×2): 11 [IU] via SUBCUTANEOUS
  Administered 2013-11-07: 2 [IU] via SUBCUTANEOUS
  Administered 2013-11-07: 3 [IU] via SUBCUTANEOUS
  Administered 2013-11-08: 5 [IU] via SUBCUTANEOUS
  Administered 2013-11-08: 2 [IU] via SUBCUTANEOUS
  Administered 2013-11-08: 5 [IU] via SUBCUTANEOUS
  Administered 2013-11-09: 3 [IU] via SUBCUTANEOUS
  Administered 2013-11-09: 8 [IU] via SUBCUTANEOUS
  Administered 2013-11-10: 5 [IU] via SUBCUTANEOUS

## 2013-11-06 MED ORDER — POTASSIUM CHLORIDE 10 MEQ/100ML IV SOLN
10.0000 meq | INTRAVENOUS | Status: AC
  Administered 2013-11-06 (×2): 10 meq via INTRAVENOUS
  Filled 2013-11-06 (×2): qty 100

## 2013-11-06 MED ORDER — BOOST / RESOURCE BREEZE PO LIQD: 1.0000 | Freq: Two times a day (BID) | ORAL | Status: DC | PRN

## 2013-11-06 MED ORDER — TRAVASOL 10 % IV SOLN
INTRAVENOUS | Status: AC
  Administered 2013-11-06: 18:00:00 via INTRAVENOUS

## 2013-11-06 MED ORDER — HALOPERIDOL LACTATE 5 MG/ML IJ SOLN
2.0000 mg | Freq: Four times a day (QID) | INTRAMUSCULAR | Status: DC | PRN
  Administered 2013-11-06: 2 mg via INTRAVENOUS
  Filled 2013-11-06: qty 1

## 2013-11-06 NOTE — Progress Notes (Signed)
[facility]  Patient Status: Active pt with [facility] prior to this readmission  [facility] is providing the following services: [place] for Home TNA.  See Home formula below.    If patient discharges after hours, please call [phone].   Larry Sierras 11/06/2013, 2:28 PM

## 2013-11-06 NOTE — Progress Notes (Signed)
Progress Note   Brandon [name] BZJ:[id number] DOB: 1955/07/03 DOA: 11/05/2013 PCP: [name], MD   Brief Narrative:   Brandon [name] is an 58 y.o. male with a PMH of metastatic rectal cancer treated with 14 cycles of 5-FU, currently under the care of Dr. Marcell Anger (oncology) at [facility] [phone], recent hospitalization for GI bleed, performance status of 3, last seen by his oncologist on 10/24/13 with recommendations for symptom management given his overall frail state. His oncologist attempted to discuss his CODE STATUS with him at that visit, however he did not want to engage in a conversation about this at that time. He has had problems with symptom management including issues related to nausea, anxiety, and pain. He was referred as a direct admission by his palliative care doctor, Dr. Hilma Favors, for symptom management and concerns for opiate toxicity.   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   Dilaudid-HP drip discontinued, now on fentanyl for pain control.  Remains confused but myoclonic jerking spells diminishing.  Active Problems:  Fever  Blood cultures sent 11/05/13.  Chest x-ray negative for infiltrates.  Send urinalysis.  Abdominal pain, chronic, right upper quadrant / cancer related pain   Pain management per palliative care team.   Functional quadriplegia   Reassess when sedating effects of high-dose opiates wear off.  Protein-calorie malnutrition, severe   Resume TNA.  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 Palliative care following.  Dehydration / acute kidney injury / bilateral hydronephrosis  Hydrating with normal saline.  Monitor renal function periodically.  Elevated LFTs   Likely related to liver and bone metastasis.   Anemia of chronic disease   No current indication for transfusion.     DVT prophylaxis   Lovenox ordered.  Code Status: DNR  Family Communication: Father at bedside.  Disposition Plan: Probable residential hospice placement.    IV Access:    Double lumen CVC right subclavian   Procedures and diagnostic studies:    11/06/13: Chest x-ray: Bibasilar atelectasis. No acute infiltrate.   Medical Consultants:    Isaac Laud, Palliative Care   Other Consultants:    None.   Anti-Infectives:    None.   Subjective:   Brandon [name] continues to be confused with intermittent agitation with discontinuation of Dilaudid-HP and fentanyl patches. He urinated on the floor last night. He also spiked a fever yesterday, and has been running low-grade fevers over the past 24 hours. No dyspnea. Mood is labile with some crying spells. No nausea or vomiting.  Objective:    Filed Vitals:   11/05/13 2244 11/06/13 0158 11/06/13 0450 11/06/13 0552  BP:  112/66  119/84  Pulse: 122 137 121 144  Temp: 99.5 F (37.5 C) 100.8 F (38.2 C) 100.7 F (38.2 C) 99.4 F (37.4 C)  TempSrc: Oral Oral Oral Axillary  Resp: 20 18 16 16   Height:      Weight:      SpO2: 100% 96% 95% 100%    Intake/Output Summary (Last 24 hours) at 11/06/13 0757 Last data filed at 11/06/13 2778  Gross per 24 hour  Intake 2490.8 ml  Output   1785 ml  Net  705.8 ml    Exam: Gen:  More awake/alert but confused Cardiovascular:  Tachycardic, No M/R/G Respiratory:  Lungs CTAB Gastrointestinal:  Abdomen soft, NT/ND, + BS, +ostomy/PEG Extremities:  3+ edema  Data Reviewed:    Labs: Basic Metabolic Panel:  Recent Labs Lab 11/05/13 1350  NA 148*  K 4.1  CL 108  CO2 24  GLUCOSE 138*  BUN 30*  CREATININE 1.15  CALCIUM 9.8  MG 2.0  PHOS 4.3   GFR Estimated Creatinine Clearance: 65.5 ml/min (by C-G formula based on Cr of 1.15). Liver Function Tests:  Recent Labs Lab 11/05/13 1350  AST 49*  ALT 63*  ALKPHOS 270*  BILITOT 0.6  PROT 6.6  ALBUMIN  2.4*   CBC:  Recent Labs Lab 11/05/13 1350  WBC 6.2  NEUTROABS 5.4  HGB 9.7*  HCT 31.5*  MCV 97.2  PLT 235   Microbiology Recent Results (from the past 240 hour(s))  MRSA PCR SCREENING     Status: None   Collection Time    11/05/13  4:21 PM      Result Value Ref Range Status   MRSA by PCR NEGATIVE  NEGATIVE Final   Comment:            The GeneXpert MRSA Assay (FDA     approved for NASAL specimens     only), is one component of a     comprehensive MRSA colonization     surveillance program. It is not     intended to diagnose MRSA     infection nor to guide or     monitor treatment for     MRSA infections.     Medications:   . clonazepam  0.5 mg Oral BID  . dexamethasone  4 mg Oral Q breakfast  . DOBELLS  15 mL Mouth/Throat BID  . enoxaparin (LOVENOX) injection  40 mg Subcutaneous Q24H  . pantoprazole  40 mg Oral BID  . senna  1 tablet Oral BID  . tamsulosin  0.4 mg Oral QHS   Continuous Infusions: . sodium chloride 100 mL/hr at 11/06/13 0200  . fentaNYL infusion INTRAVENOUS 250 mcg/hr (11/06/13 0200)    Time spent: 35 minutes with > 50% of time discussing current diagnostic test results, clinical impression and plan of care.    LOS: 1 day   Debbora Ang  [facility] Pager [phone]. If unable to reach me by pager, please call my cell phone at [phone].  *Please refer to [email or web address], password [email or web address] to get updated schedule on who will round on this patient, as hospitalists switch teams weekly. If 7PM-7AM, please contact night-coverage at [email or web address], password [email or web address] for any overnight needs.  11/06/2013, 7:57 AM    **Disclaimer: This note was dictated with voice recognition software. Similar sounding words can inadvertently be transcribed and this note may contain transcription errors which may not have been corrected upon publication of note.**

## 2013-11-06 NOTE — Progress Notes (Signed)
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 Codeine Nausea And Vomiting  . Effexor [Venlafaxine Hcl] Other (See Comments)    Flat Feeling  . Imuran [Azathioprine] Nausea And Vomiting    Patient Measurements: Height: 5\' 7"  (170.2 cm) Weight: 161 lb 12.8 oz (73.392 kg) IBW/kg (Calculated) : 66.1  Vital Signs: Temp: 99.4 F (37.4 C) (07/28 0552) Temp src: Axillary (07/28 0552) BP: 119/84 mmHg (07/28 0552) Pulse Rate: 144 (07/28 0552) Intake/Output from previous day: 07/27 0701 - 07/28 0700 In: 2490.8 [P.O.:617; I.V.:1873.8] Out: 1785 [Urine:275; Drains:325; ASTMH:9622] Intake/Output from this shift: Total I/O In: -  Out: 125 [Stool:125]  Labs:  Recent Labs  11/05/13 1350  WBC 6.2  HGB 9.7*  HCT 31.5*  PLT 235     Recent Labs  11/05/13 1350 11/06/13 1005  NA 148* 147  K 4.1 3.5*  CL 108 112  CO2 24 22  GLUCOSE 138* 126*  BUN 30* 24*  CREATININE 1.15 1.16  CALCIUM 9.8 7.7*  MG 2.0  --   PHOS 4.3  --   PROT 6.6  --   ALBUMIN 2.4*  --   AST 49*  --   ALT 63*  --   ALKPHOS 270*  --   BILITOT 0.6  --    Estimated Creatinine Clearance: 64.9 ml/min (by C-G formula based on Cr of 1.16).   No results found for this basename: GLUCAP,  in the last 72 hours  Medical History: Past Medical History  Diagnosis Date  . Biliary dyskinesia   . Bowel obstruction   . Ulcerative colitis   . Allergy   . Depression   . Ileostomy in place   . ED (erectile dysfunction)   . Benign positional vertigo   . Prolactinoma   . Hypogonadism male   . History of blood transfusion   . Pituitary tumor 2009  . 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 sodium chloride 100 mL/hr at  11/06/13 0200  . fentaNYL infusion INTRAVENOUS 250 mcg/hr (11/06/13 0200)   PRN: acetaminophen, acetaminophen, diazepam, fentaNYL, haloperidol lactate, promethazine  Insulin Requirements in the past 24 hours:  n/a  Current Nutrition:  Long term cyclic TPN as outpatient prior to admission: 12 hr nightly admin of TPN without lipids 6x/week provides 96g protein daily and 1658 kcal/day and TPN with lipids also added only Fridays provides 96g protein and 2658 kcal. 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 Palliative managing patient and patient currently on fentanyl drip. TNA was held last night but per Md to restart TNA tonight and use patient's home supply per Md order.   Glu: stable with AM labs Lytes: stable but K slightly low at 3.5 - to order replacement Liver: LFTs and AlkP slightly elevated and tbili WNL Renal: stable Prealbumin: will check TGs: will check  Nutritional Goals:  Per RD assessment  Plan:  1) As stated above, will use patient's own supply of TNA for now unless labs dictate to change to our supply. Patient's father to  bring in TNA tonight and will bring in additives of MVI 38mL and octreotide 58mcg that pharmacy will add to bag.  2) Cyclic TNA 5732 mL will run over 12 hours at the following rates:  A. 6p-7p: 100 ml/hr  B. 7p-5a: 230 ml/hr  C. 5a-6a: 100 ml/hr 2) MonThurs TNA labs 3) Start SSI/CBGs checks 4) Will order 2 runs [place], PharmD, BCPS Pager [phone] 11/06/2013 11:08 AM

## 2013-11-06 NOTE — Care Management Note (Addendum)
    Page 1 of 2   11/13/2013     9:49:26 AM CARE MANAGEMENT NOTE 11/13/2013  Patient:  [name],[name]   Account Number:  [id number]  Date Initiated:  11/06/2013  Documentation initiated by:  [facility]  Subjective/Objective Assessment:   [id number] pain; neurotoxicity; pain mgmt     Action/Plan:   HH with methadone PCA?   Anticipated DC Date:  11/09/2013   Anticipated DC Plan:  [place]  In-house referral  Clinical Social Worker  Chaplain  Hospice / [place]  CM consult      [facility] Choice  Resumption Of Svcs/PTA Provider   Choice offered to / List presented to:          [facility] arranged  HH-1 RN      [facility].   Status of service:  Completed, signed off Medicare Important Message given?   (If response is "NO", the following Medicare IM given date fields will be blank) Date Medicare IM given:   Medicare IM given by:   Date Additional Medicare IM given:   Additional Medicare IM given by:    Discharge Disposition:  [place]  Per UR Regulation:  Reviewed for med. necessity/level of care/duration of stay  If discussed at [place] of Stay Meetings, dates discussed:    Comments:  [id number]/Brandon Rosana Hoes, RN, BSN, CCM: CHART REVIEWED AND UPDATED.   Next chart review due on [id number]. Plan is for residental hoscipe versus inh hosp. death. NO DISCHARGE NEEDS PRESENT AT THIS TIME WILL CONTINUE TO FOLLOW. CASE MANAGEMENT [phone]    11/08/13 12:30 CM spoke with pt and family in room to discusss their discharge wishes.  Pt and family are in agreement pt would like to go home( vs a residential hospice): IF PT GOES HOME WITH TPN, he won't meet [place] ([facility]) for hospice care and will need [place] as pt is active for HHRN/TPN with [facility];  and when TPN is stopped, [facility] can arrange transition to [facility] care in the home.  Pt and family verbalized  understanding;. IF PT GOES HOME WITHOUT TPN, then pt would like to go home with [facility] and transitioning  to [place] [facility] [place] will be arranged by [facility] when time comes. CM spoke with Brandon [name] of [facility], [phone],  who confirms this is her (and Brandon [name])' understanding of plan. CM also spoke with [facility] rep, Brandon [name] who also states this is her understanding of the plan.  CM will continue to follow for discharge needs.  Brandon [name], BSN, [place] [phone].   11/06/13 13:30Pt is active with [facility].   CM spoke with CSW (pt not appropriate at present for residential hospice). CM spoke with [facility] rep, Brandon [name] who is researching whether or not  [facility] can do a methadone PCA.  Will continue to follow for disposition. Brandon [name], BSN, CM [phone].

## 2013-11-06 NOTE — Progress Notes (Signed)
Progress Note from the Palliative Medicine Team at [place]: Remains very confused. Family states Brandon [name] was doing better last night though. Reportedly was able to lay calmly and even had some moments of clarity as well.  Very agitated this morning when I went to see him.  Brandon [name] urinated on the floor and agitation actually improved, but Brandon [name] was unable to communicate his need to urinate.  Able to deny pain for me this morning and as Brandon [name] did last night. Still unable to verbalize how Brandon [name] is feeling though.     Objective: Allergies  Allergen Reactions  . Codeine Nausea And Vomiting  . Effexor [Venlafaxine Hcl] Other (See Comments)    Flat Feeling  . Imuran [Azathioprine] Nausea And Vomiting   Scheduled Meds: . clonazepam  0.5 mg Oral BID  . dexamethasone  4 mg Oral Q breakfast  . DOBELLS  15 mL Mouth/Throat BID  . enoxaparin (LOVENOX) injection  40 mg Subcutaneous Q24H  . pantoprazole  40 mg Oral BID  . senna  1 tablet Oral BID  . tamsulosin  0.4 mg Oral QHS   Continuous Infusions: . sodium chloride 100 mL/hr at 11/06/13 0200  . fentaNYL infusion INTRAVENOUS 250 mcg/hr (11/06/13 0200)   PRN Meds:.acetaminophen, acetaminophen, diazepam, fentaNYL, haloperidol lactate, promethazine  BP 119/84  Pulse 144  Temp(Src) 99.4 F (37.4 C) (Axillary)  Resp 16  Ht 5\' 7"  (1.702 m)  Wt 73.392 kg (161 lb 12.8 oz)  BMI 25.34 kg/m2  SpO2 100%     Intake/Output Summary (Last 24 hours) at 11/06/13 6967 Last data filed at 11/06/13 8938  Gross per 24 hour  Intake 2490.8 ml  Output   1785 ml  Net  705.8 ml    Physical Exam: General: confused,drowsy  HEENT: [place], sclera anicteric, dry mm, pupils mildly dilated  Chest: CTAB, symm exp   CVS: Tachy, regular  Abdomen: NT, + Ostomy, +PEG  Ext: lower ext edema  Neuro: disoriented, can follow some commands, hyper-reflexia and myclonic jerks seem better. Still tremulous though.  Having some inducible clonus.    Labs: CBC    Component  Value Date/Time   WBC 6.2 11/05/2013 1350   RBC 3.24* 11/05/2013 1350   HGB 9.7* 11/05/2013 1350   HCT 31.5* 11/05/2013 1350   PLT 235 11/05/2013 1350   MCV 97.2 11/05/2013 1350   MCH 29.9 11/05/2013 1350   MCHC 30.8 11/05/2013 1350   RDW 16.6* 11/05/2013 1350   LYMPHSABS 0.4* 11/05/2013 1350   MONOABS 0.3 11/05/2013 1350   EOSABS 0.0 11/05/2013 1350   BASOSABS 0.0 11/05/2013 1350    BMET    Component Value Date/Time   NA 148* 11/05/2013 1350   K 4.1 11/05/2013 1350   CL 108 11/05/2013 1350   CO2 24 11/05/2013 1350   GLUCOSE 138* 11/05/2013 1350   BUN 30* 11/05/2013 1350   CREATININE 1.15 11/05/2013 1350   CALCIUM 9.8 11/05/2013 1350   GFRNONAA 68* 11/05/2013 1350   GFRAA 79* 11/05/2013 1350    CMP     Component Value Date/Time   NA 148* 11/05/2013 1350   K 4.1 11/05/2013 1350   CL 108 11/05/2013 1350   CO2 24 11/05/2013 1350   GLUCOSE 138* 11/05/2013 1350   BUN 30* 11/05/2013 1350   CREATININE 1.15 11/05/2013 1350   CALCIUM 9.8 11/05/2013 1350   PROT 6.6 11/05/2013 1350   ALBUMIN 2.4* 11/05/2013 1350   AST 49* 11/05/2013 1350   ALT 63* 11/05/2013 1350  ALKPHOS 270* 11/05/2013 1350   BILITOT 0.6 11/05/2013 1350   GFRNONAA 68* 11/05/2013 1350   GFRAA 79* 11/05/2013 1350     Assessment/Recommendations:  58 yo male with Crohns, metastatic rectal CA with progressive disease s/p multiple GI resections with resultant short gut syndrome necessitating TPN.   1. Code Status: DNR, I have not discussed this with Brandon [name], but certainly appropriate given advanced nature of his cancer   2. Goals of Care: See initial consult note. I spoke with Brandon [name]'s dad Brandon [name] today about possibly pursuing hospice care and possibly transfer to inpatient hospice facility for ongoing symptom management related to possible Brandon [name].  Brandon [name] seems agreeable to exploring this option. I think Brandon [name] would likely benefit from methadone PCA and few hospice agencies are able to provide this. I will contact Brandon [name] from [facility] to  see if she could assist Brandon [name].    3. Symptom Management:  1. 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  - Will dc zofran for nausea (suspect this is not serotonin syndrome but will minimize risk) 2. Cancer related pain- See initial consult for details of opioid rotation.  Not complaining of pain at all today. Reportedly family asked last night and Brandon [name] denied as well.  - Continue fentanyl 231mcg/hr infusion as well as 234mcg PRN bolus dose (3 PRN doses since arrival)   3. Nausea- Chronic issue for Brandon [name].  Had been taking BID zofran at home. Will d/c as above. Can use phenergan or PRN haldol  4. AKI: Unclear cause. Possible intervascular depletion. Brandon [name] certainly is 3rd spacing fluids. His baseline creatinine is about 0.8, so mild bump. Has history of chronic hydronephrosis but suspect not active issue - Continue IVF - I will check BMP this morning  5. Psychosocial/Spiritual: Brandon [name] lives in [place], [place] with his dad. His father, Brandon [name], is his HCPOA though we do not have this official documentation. Brandon [name] lost his motherto advanced metastatic ovarian cancer. Brandon [name] had actually enrolled in hospice partly because they took such good care of his mother.   Time In 730 Time Out 805 Total Time 35 minutes   Greater than 50%  of this time was spent counseling and coordinating care related to the above assessment and plan.    Doran Clay D.O. Palliative Medicine Team at [facility]  Pager: [phone] Team Phone:  [phone]

## 2013-11-06 NOTE — Progress Notes (Addendum)
INITIAL NUTRITION ASSESSMENT  DOCUMENTATION CODES Per approved criteria  -Not Applicable   INTERVENTION: -TPN per pharmacy-plan to resume home TPN regimen -Resource Breeze BID PRN pending pt's alertness  -Diet advancement per MD -Will continue to monitor  NUTRITION DIAGNOSIS: Increased nutrient needs (protein/kcal) related to increased demand for nutrients as evidenced by chronic disease-rectal cancer.   Goal: TPN to meet >/= 90% of their estimated nutrition needs    Monitor:  TPN tolerance, total protein/energy intake, labs, weights, GI profile  Reason for Assessment: TPN Consult/MST  58 y.o. male  Admitting Dx: Neurotoxicity  ASSESSMENT: Brandon [name] is an 58 y.o. male with a PMH of metastatic rectal cancer treated with 14 cycles of 5-FU, currently under the care of Dr. Marcell Anger (oncology) at [facility] [phone], recent hospitalization for GI bleed, performance status of 3, last seen by his oncologist on 10/24/13 with recommendations for symptom management given his overall frail state. His oncologist attempted to discuss his CODE STATUS with him at that visit, however he did not want to engage in a conversation about this at that time. He has had problems with symptom management including issues related to nausea, anxiety, and pain. He was referred as a direct admission by his palliative care doctor, Dr. Hilma Favors, for symptom management and concerns for opiate toxicity.   -Pt has been on TPN for over one year per previous medical records -[place] records provided by pharmacy. Home TPN regimen indicates pt receives 2500 ml over 24 hrs w/out lipids for 6 days/wk, which provides 1658 kcal, 96 gram protein.  Pt receives TPN 2500 ml over 24 hours with lipids for one day/wk which provides 2658 kcal, 96 gram protein.  -Provides average of 1800 kcal (90% est kcal needs), and 96 gram protein (96% est protein needs) per week -Family reported pt with minimal PO  intake for past one month. Consumes mostly liquids- sprite, coke. Had been able to tolerate 1-2 bites of foods (peaches, ground meats) in previous months. Did not consume nutrition supplements -Pt with fluid accumulation in lower extremities, therefore difficulty to determine accurate current weight; however family denied any unintentional wt loss while receiving TPN -Discussed pt with pharmacy, MD and family wish to resume home TPN formula during admit. Will likely initiate later this afternoon (7/28) -Pt lethargic and agitated upon admit. Will order Resource Breeze BID PRN pending on pt's alert level -Phos/Mg/K WNL -CBG's < 150mg /dl    Height: Ht Readings from Last 1 Encounters:  11/05/13 5\' 7"  (1.702 m)    Weight: Wt Readings from Last 1 Encounters:  11/05/13 161 lb 12.8 oz (73.392 kg)    Ideal Body Weight: 148 lbs  % Ideal Body Weight: 109%  Wt Readings from Last 10 Encounters:  11/05/13 161 lb 12.8 oz (73.392 kg)  09/17/13 164 lb 7.4 oz (74.6 kg)  09/17/13 164 lb 7.4 oz (74.6 kg)  09/17/13 164 lb 7.4 oz (74.6 kg)  09/17/13 164 lb 7.4 oz (74.6 kg)  07/05/12 152 lb (68.947 kg)  07/05/12 152 lb (68.947 kg)  06/06/12 156 lb 6.4 oz (70.943 kg)  05/10/12 155 lb 6.4 oz (70.489 kg)  04/26/12 163 lb (73.936 kg)    Usual Body Weight: 155-160 lbs per previous medical records  % Usual Body Weight: 100%  BMI:  Body mass index is 25.34 kg/(m^2).  Estimated Nutritional Needs: Kcal: 2000-2200 Protein: 100-110 gram Fluid: >/=2000 ml/daily  Skin: +4 edema in  RLE and LLE  Diet Order: Full Liquid  EDUCATION NEEDS: -No education needs identified at this time   Intake/Output Summary (Last 24 hours) at 11/06/13 1100 Last data filed at 11/06/13 1019  Gross per 24 hour  Intake 2490.8 ml  Output   1910 ml  Net  580.8 ml    Last BM: 7/28-colostomy   Labs:   Recent Labs Lab 11/05/13 1350 11/06/13 1005  NA 148* 147  K 4.1 3.5*  CL 108 112  CO2 24 22  BUN 30* 24*   CREATININE 1.15 1.16  CALCIUM 9.8 7.7*  MG 2.0  --   PHOS 4.3  --   GLUCOSE 138* 126*    CBG (last 3)  No results found for this basename: GLUCAP,  in the last 72 hours  Scheduled Meds: . clonazepam  0.5 mg Oral BID  . dexamethasone  4 mg Oral Q breakfast  . DOBELLS  15 mL Mouth/Throat BID  . enoxaparin (LOVENOX) injection  40 mg Subcutaneous Q24H  . pantoprazole  40 mg Oral BID  . senna  1 tablet Oral BID  . tamsulosin  0.4 mg Oral QHS    Continuous Infusions: . sodium chloride 100 mL/hr at 11/06/13 0200  . fentaNYL infusion INTRAVENOUS 250 mcg/hr (11/06/13 0200)    Past Medical History  Diagnosis Date  . Biliary dyskinesia   . Bowel obstruction   . Ulcerative colitis   . Allergy   . Depression   . Ileostomy in place   . ED (erectile dysfunction)   . Benign positional vertigo   . Prolactinoma   . Hypogonadism male   . History of blood transfusion   . Pituitary tumor 2009  . 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 Colon surgery  2003    Colectomy with ileostomy  . Rectal surgery  2009    Resection of rectal pouch  . Knee arthroscopy    . Cholecystectomy  04/26/2012    Procedure: LAPAROSCOPIC CHOLECYSTECTOMY;  Surgeon: Leighton Ruff, MD;  Location: [facility];  Service: General;  Laterality: N/A;  . Eus N/A 07/05/2012    Procedure: ESOPHAGEAL ENDOSCOPIC ULTRASOUND (EUS) RADIAL;  Surgeon: Arta Silence, MD;  Location: [facility];  Service: Endoscopy;  Laterality: N/A;  celiac block  . Neurolytic celiac plexus N/A 07/05/2012    Procedure: NEUROLYTIC CELIAC PLEXUS;  Surgeon: Arta Silence, MD;  Location: [facility];  Service: Endoscopy;  Laterality: N/A;  . Esophagogastroduodenoscopy N/A 09/10/2013    Procedure: ESOPHAGOGASTRODUODENOSCOPY (EGD);  Surgeon: Missy Sabins, MD;  Location: [facility];  Service: Endoscopy;  Laterality: N/A;    Atlee Abide MS RD LDN Clinical Dietitian [phone]

## 2013-11-07 DIAGNOSIS — B49 Unspecified mycosis: Secondary | ICD-10-CM | POA: Diagnosis present

## 2013-11-07 DIAGNOSIS — R69 Illness, unspecified: Secondary | ICD-10-CM

## 2013-11-07 DIAGNOSIS — N179 Acute kidney failure, unspecified: Secondary | ICD-10-CM

## 2013-11-07 DIAGNOSIS — G893 Neoplasm related pain (acute) (chronic): Secondary | ICD-10-CM

## 2013-11-07 DIAGNOSIS — C20 Malignant neoplasm of rectum: Secondary | ICD-10-CM | POA: Diagnosis not present

## 2013-11-07 DIAGNOSIS — Z515 Encounter for palliative care: Secondary | ICD-10-CM

## 2013-11-07 LAB — GLUCOSE, CAPILLARY
GLUCOSE-CAPILLARY: 100 mg/dL — AB (ref 70–99)
GLUCOSE-CAPILLARY: 305 mg/dL — AB (ref 70–99)
Glucose-Capillary: 149 mg/dL — ABNORMAL HIGH (ref 70–99)
Glucose-Capillary: 156 mg/dL — ABNORMAL HIGH (ref 70–99)
Glucose-Capillary: 203 mg/dL — ABNORMAL HIGH (ref 70–99)

## 2013-11-07 LAB — DIFFERENTIAL
Basophils Absolute: 0 10*3/uL (ref 0.0–0.1)
Basophils Relative: 0 % (ref 0–1)
EOS ABS: 0 10*3/uL (ref 0.0–0.7)
Eosinophils Relative: 0 % (ref 0–5)
LYMPHS ABS: 0.6 10*3/uL — AB (ref 0.7–4.0)
Lymphocytes Relative: 9 % — ABNORMAL LOW (ref 12–46)
MONOS PCT: 6 % (ref 3–12)
Monocytes Absolute: 0.4 10*3/uL (ref 0.1–1.0)
NEUTROS ABS: 5.7 10*3/uL (ref 1.7–7.7)
Neutrophils Relative %: 86 % — ABNORMAL HIGH (ref 43–77)

## 2013-11-07 LAB — COMPREHENSIVE METABOLIC PANEL
ALT: 67 U/L — ABNORMAL HIGH (ref 0–53)
ANION GAP: 11 (ref 5–15)
AST: 53 U/L — ABNORMAL HIGH (ref 0–37)
Albumin: 2 g/dL — ABNORMAL LOW (ref 3.5–5.2)
Alkaline Phosphatase: 173 U/L — ABNORMAL HIGH (ref 39–117)
BILIRUBIN TOTAL: 0.5 mg/dL (ref 0.3–1.2)
BUN: 35 mg/dL — AB (ref 6–23)
CHLORIDE: 112 meq/L (ref 96–112)
CO2: 22 meq/L (ref 19–32)
Calcium: 8.2 mg/dL — ABNORMAL LOW (ref 8.4–10.5)
Creatinine, Ser: 1.01 mg/dL (ref 0.50–1.35)
GFR, EST NON AFRICAN AMERICAN: 80 mL/min — AB (ref >90)
GLUCOSE: 125 mg/dL — AB (ref 70–99)
Potassium: 4 mEq/L (ref 3.7–5.3)
SODIUM: 145 meq/L (ref 137–147)
Total Protein: 5.5 g/dL — ABNORMAL LOW (ref 6.0–8.3)

## 2013-11-07 LAB — CBC
HEMATOCRIT: 25.9 % — AB (ref 39.0–52.0)
HEMOGLOBIN: 7.9 g/dL — AB (ref 13.0–17.0)
MCH: 29.8 pg (ref 26.0–34.0)
MCHC: 30.5 g/dL (ref 30.0–36.0)
MCV: 97.7 fL (ref 78.0–100.0)
Platelets: 165 10*3/uL (ref 150–400)
RBC: 2.65 MIL/uL — ABNORMAL LOW (ref 4.22–5.81)
RDW: 17 % — ABNORMAL HIGH (ref 11.5–15.5)
WBC: 6.6 10*3/uL (ref 4.0–10.5)

## 2013-11-07 LAB — MAGNESIUM: Magnesium: 2.5 mg/dL (ref 1.5–2.5)

## 2013-11-07 LAB — PHOSPHORUS: Phosphorus: 2.3 mg/dL (ref 2.3–4.6)

## 2013-11-07 LAB — TRIGLYCERIDES: Triglycerides: 236 mg/dL — ABNORMAL HIGH (ref <150)

## 2013-11-07 LAB — PREALBUMIN: Prealbumin: 9.4 mg/dL — ABNORMAL LOW (ref 17.0–34.0)

## 2013-11-07 MED ORDER — CLONAZEPAM 1 MG PO TBDP
1.0000 mg | ORAL_TABLET | Freq: Two times a day (BID) | ORAL | Status: DC
  Administered 2013-11-07 – 2013-11-12 (×10): 1 mg via ORAL
  Filled 2013-11-07: qty 2
  Filled 2013-11-07: qty 1
  Filled 2013-11-07 (×2): qty 2
  Filled 2013-11-07: qty 1
  Filled 2013-11-07 (×2): qty 2
  Filled 2013-11-07 (×3): qty 1

## 2013-11-07 MED ORDER — TRAVASOL 10 % IV SOLN
INTRAVENOUS | Status: AC
  Administered 2013-11-07: 18:00:00 via INTRAVENOUS

## 2013-11-07 MED ORDER — SODIUM CHLORIDE 0.9 % IV SOLN
100.0000 mg | Freq: Every day | INTRAVENOUS | Status: DC
  Administered 2013-11-07 – 2013-11-10 (×4): 100 mg via INTRAVENOUS
  Filled 2013-11-07 (×4): qty 100

## 2013-11-07 NOTE — Consult Note (Addendum)
[facility]  Date of Admission:  11/05/2013  Date of Consult:  11/07/2013  Reason for Consult: Fungemia Referring Physician: Rama  Impression/Recommendation Fungemia Rectal Cancer, stage IV  Continue micafungin Strongly consider removal of line Repeat BCx  Comment- I spoke with pt and his family about possible removal of his line. Leaving the line in place would increase his risk of recurrence and treatment failure. He could have a bridging peripheral until a new line is placed (after follow up BCx are -).  Continue Micafungin til his yeast ID is confirmed.   Thank you so much for this interesting consult,   [name] (pager) [phone] [email or web address]  JUSTINIAN [name] is an 58 y.o. male.  HPI: 58 yo M with hx of rectal cancer and short gut syndrome. He is on home infusion pump dilaudid, nightly TPN,  and PEG feedings. He was admitted on 7-27 by his palliative care MD due to concerns about opiate toxicity (he developed weakness and myoclonic jerking). In hospital he developed fever to 100.8. He had BCx drawn on admission that are now growing yeast in 1/2.    Past Medical History  Diagnosis Date  . Biliary dyskinesia   . Bowel obstruction   . Ulcerative colitis   . Allergy   . Depression   . Ileostomy in place   . ED (erectile dysfunction)   . Benign positional vertigo   . Prolactinoma   . Hypogonadism male   . History of blood transfusion   . Pituitary tumor 2009  . 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 Colon surgery  2003    Colectomy with ileostomy  . Rectal surgery  2009    Resection of rectal pouch  . Knee arthroscopy    . Cholecystectomy  04/26/2012    Procedure: LAPAROSCOPIC CHOLECYSTECTOMY;  Surgeon: Leighton Ruff, MD;  Location: [facility];  Service: General;  Laterality: N/A;  . Eus N/A 07/05/2012    Procedure: ESOPHAGEAL ENDOSCOPIC ULTRASOUND (EUS) RADIAL;   Surgeon: Arta Silence, MD;  Location: [facility];  Service: Endoscopy;  Laterality: N/A;  celiac block  . Neurolytic celiac plexus N/A 07/05/2012    Procedure: NEUROLYTIC CELIAC PLEXUS;  Surgeon: Arta Silence, MD;  Location: [facility];  Service: Endoscopy;  Laterality: N/A;  . Esophagogastroduodenoscopy N/A 09/10/2013    Procedure: ESOPHAGOGASTRODUODENOSCOPY (EGD);  Surgeon: Missy Sabins, MD;  Location: [facility];  Service: Endoscopy;  Laterality: N/A;     Allergies  Allergen Reactions  . Codeine Nausea And Vomiting  . Effexor [Venlafaxine Hcl] Other (See Comments)    Flat Feeling  . Imuran [Azathioprine] Nausea And Vomiting    Medications:  Scheduled: . clonazepam  0.5 mg Oral BID  . dexamethasone  4 mg Oral Q breakfast  . DOBELLS  15 mL Mouth/Throat BID  . enoxaparin (LOVENOX) injection  40 mg Subcutaneous Q24H  . insulin aspart  0-15 Units Subcutaneous Q6H  . micafungin (MYCAMINE) IV  100 mg Intravenous Daily  . pantoprazole  40 mg Oral BID  . senna  1 tablet Oral BID  . tamsulosin  0.4 mg Oral QHS    Abtx:  Anti-infectives   Start     Dose/Rate Route Frequency Ordered Stop   11/07/13 1000  micafungin (MYCAMINE) 100 mg in sodium chloride 0.9 % 100 mL IVPB     100 mg 100 mL/hr over 1 Hours Intravenous Daily 11/07/13 0738  Total days of antibiotics 1 (micafungin)          Social History:  reports that he has never smoked. He has never used smokeless tobacco. He reports that he does not drink alcohol or use illicit drugs.  Family History  Problem Relation Age of Onset  . Cancer Mother     Ovarian  . Ulcerative colitis Mother   . Hypertension Father     General ROS: see HPI. has PEG. has ostomy, currently leaking, no further shaking, significant wt loss (~ 40#). see HPI.   Blood pressure 103/83, pulse 128, temperature 99 F (37.2 C), temperature source Oral, resp. rate 16, height $RemoveBe'5\' 7"'GMyyYpRbN$  (1.702 m), weight 73.392 kg (161 lb 12.8 oz), SpO2  100.00%. General appearance: alert, cooperative, no distress and slowed mentation Eyes: negative findings: pupils equal, round, reactive to light and accomodation Throat: normal findings: oropharynx pink & moist without lesions or evidence of thrush Neck: no adenopathy and supple, symmetrical, trachea midline Lungs: clear to auscultation bilaterally Chest wall: R chest hickman- non-tender, no d/c, no erythema.  Heart: regular rate and rhythm Abdomen: normal findings: bowel sounds normal and soft, non-tender and abnormal findings:  PEG site is clean. abundant stool around his ostomy site in lower abd.  Extremities: edema anasarca   Results for orders placed during the hospital encounter of 11/05/13 (from the past 48 hour(s))  MRSA PCR SCREENING     Status: None   Collection Time    11/05/13  4:21 PM      Result Value Ref Range   MRSA by PCR NEGATIVE  NEGATIVE   Comment:            The GeneXpert MRSA Assay (FDA     approved for NASAL specimens     only), is one component of a     comprehensive MRSA colonization     surveillance program. It is not     intended to diagnose MRSA     infection nor to guide or     monitor treatment for     MRSA infections.  CULTURE, BLOOD (ROUTINE X 2)     Status: None   Collection Time    11/05/13  5:50 PM      Result Value Ref Range   Specimen Description BLOOD LEFT HAND     Special Requests BOTTLES DRAWN AEROBIC AND ANAEROBIC 10CC     Culture  Setup Time       Value: 11/05/2013 22:08     Performed at [facility]   Culture       Value:        BLOOD CULTURE RECEIVED NO GROWTH TO DATE CULTURE WILL BE HELD FOR 5 DAYS BEFORE ISSUING A FINAL NEGATIVE REPORT     Performed at [facility]   Report Status PENDING    CULTURE, BLOOD (ROUTINE X 2)     Status: None   Collection Time    11/05/13  6:00 PM      Result Value Ref Range   Specimen Description BLOOD LEFT ARM     Special Requests BOTTLES DRAWN AEROBIC AND ANAEROBIC 10CC      Culture  Setup Time       Value: 11/05/2013 22:08     Performed at [facility]   Culture       Value: YEAST     Note: Gram Stain Report Called to,Read Back By and Verified With: Geanie Berlin 11/07/13 0648A [facility]  Performed at [facility]   Report Status PENDING    BASIC METABOLIC PANEL     Status: Abnormal   Collection Time    11/06/13 10:05 AM      Result Value Ref Range   Sodium 147  137 - 147 mEq/L   Potassium 3.5 (*) 3.7 - 5.3 mEq/L   Chloride 112  96 - 112 mEq/L   CO2 22  19 - 32 mEq/L   Glucose, Bld 126 (*) 70 - 99 mg/dL   BUN 24 (*) 6 - 23 mg/dL   Creatinine, Ser 0.29  0.50 - 1.35 mg/dL   Calcium 7.7 (*) 8.4 - 10.5 mg/dL   GFR calc non Af Amer 68 (*) >90 mL/min   GFR calc Af Amer 78 (*) >90 mL/min   Comment: (NOTE)     The eGFR has been calculated using the CKD EPI equation.     This calculation has not been validated in all clinical situations.     eGFR's persistently <90 mL/min signify possible Chronic Kidney     Disease.   Anion gap 13  5 - 15  URINALYSIS, ROUTINE W REFLEX MICROSCOPIC     Status: Abnormal   Collection Time    11/06/13  1:34 PM      Result Value Ref Range   Color, Urine AMBER (*) YELLOW   Comment: BIOCHEMICALS MAY BE AFFECTED BY COLOR   APPearance CLOUDY (*) CLEAR   Specific Gravity, Urine 1.020  1.005 - 1.030   pH 5.5  5.0 - 8.0   Glucose, UA NEGATIVE  NEGATIVE mg/dL   Hgb urine dipstick TRACE (*) NEGATIVE   Bilirubin Urine NEGATIVE  NEGATIVE   Ketones, ur NEGATIVE  NEGATIVE mg/dL   Protein, ur 30 (*) NEGATIVE mg/dL   Urobilinogen, UA 0.2  0.0 - 1.0 mg/dL   Nitrite NEGATIVE  NEGATIVE   Leukocytes, UA NEGATIVE  NEGATIVE  URINE MICROSCOPIC-ADD ON     Status: Abnormal   Collection Time    11/06/13  1:34 PM      Result Value Ref Range   Squamous Epithelial / LPF RARE  RARE   WBC, UA 0-2  <3 WBC/hpf   RBC / HPF 0-2  <3 RBC/hpf   Bacteria, UA RARE  RARE   Casts HYALINE CASTS (*) NEGATIVE  GLUCOSE, CAPILLARY     Status:  Abnormal   Collection Time    11/06/13  8:55 PM      Result Value Ref Range   Glucose-Capillary 309 (*) 70 - 99 mg/dL  GLUCOSE, CAPILLARY     Status: Abnormal   Collection Time    11/07/13 12:06 AM      Result Value Ref Range   Glucose-Capillary 203 (*) 70 - 99 mg/dL   Comment 1 Notify RN    GLUCOSE, CAPILLARY     Status: Abnormal   Collection Time    11/07/13  3:22 AM      Result Value Ref Range   Glucose-Capillary 156 (*) 70 - 99 mg/dL   Comment 1 Notify RN    COMPREHENSIVE METABOLIC PANEL     Status: Abnormal   Collection Time    11/07/13  6:15 AM      Result Value Ref Range   Sodium 145  137 - 147 mEq/L   Potassium 4.0  3.7 - 5.3 mEq/L   Chloride 112  96 - 112 mEq/L   CO2 22  19 - 32 mEq/L   Glucose, Bld 125 (*)  70 - 99 mg/dL   BUN 35 (*) 6 - 23 mg/dL   Creatinine, Ser 1.01  0.50 - 1.35 mg/dL   Calcium 8.2 (*) 8.4 - 10.5 mg/dL   Total Protein 5.5 (*) 6.0 - 8.3 g/dL   Albumin 2.0 (*) 3.5 - 5.2 g/dL   AST 53 (*) 0 - 37 U/L   ALT 67 (*) 0 - 53 U/L   Alkaline Phosphatase 173 (*) 39 - 117 U/L   Total Bilirubin 0.5  0.3 - 1.2 mg/dL   GFR calc non Af Amer 80 (*) >90 mL/min   GFR calc Af Amer >90  >90 mL/min   Comment: (NOTE)     The eGFR has been calculated using the CKD EPI equation.     This calculation has not been validated in all clinical situations.     eGFR's persistently <90 mL/min signify possible Chronic Kidney     Disease.   Anion gap 11  5 - 15  PREALBUMIN     Status: Abnormal   Collection Time    11/07/13  6:15 AM      Result Value Ref Range   Prealbumin 9.4 (*) 17.0 - 34.0 mg/dL   Comment: Performed at [facility]     Status: None   Collection Time    11/07/13  6:15 AM      Result Value Ref Range   Magnesium 2.5  1.5 - 2.5 mg/dL  PHOSPHORUS     Status: None   Collection Time    11/07/13  6:15 AM      Result Value Ref Range   Phosphorus 2.3  2.3 - 4.6 mg/dL  TRIGLYCERIDES     Status: Abnormal   Collection Time    11/07/13   6:15 AM      Result Value Ref Range   Triglycerides 236 (*) <150 mg/dL   Comment: Performed at [facility]  CBC     Status: Abnormal   Collection Time    11/07/13  6:15 AM      Result Value Ref Range   WBC 6.6  4.0 - 10.5 K/uL   RBC 2.65 (*) 4.22 - 5.81 MIL/uL   Hemoglobin 7.9 (*) 13.0 - 17.0 g/dL   Comment: REPEATED TO VERIFY     DELTA CHECK NOTED   HCT 25.9 (*) 39.0 - 52.0 %   MCV 97.7  78.0 - 100.0 fL   MCH 29.8  26.0 - 34.0 pg   MCHC 30.5  30.0 - 36.0 g/dL   RDW 17.0 (*) 11.5 - 15.5 %   Platelets 165  150 - 400 K/uL   Comment: REPEATED TO VERIFY     DELTA CHECK NOTED  DIFFERENTIAL     Status: Abnormal   Collection Time    11/07/13  6:15 AM      Result Value Ref Range   Neutrophils Relative % 86 (*) 43 - 77 %   Neutro Abs 5.7  1.7 - 7.7 K/uL   Lymphocytes Relative 9 (*) 12 - 46 %   Lymphs Abs 0.6 (*) 0.7 - 4.0 K/uL   Monocytes Relative 6  3 - 12 %   Monocytes Absolute 0.4  0.1 - 1.0 K/uL   Eosinophils Relative 0  0 - 5 %   Eosinophils Absolute 0.0  0.0 - 0.7 K/uL   Basophils Relative 0  0 - 1 %   Basophils Absolute 0.0  0.0 - 0.1 K/uL  GLUCOSE, CAPILLARY  Status: Abnormal   Collection Time    11/07/13  8:27 AM      Result Value Ref Range   Glucose-Capillary 100 (*) 70 - 99 mg/dL      Component Value Date/Time   SDES BLOOD LEFT ARM 11/05/2013 1800   SPECREQUEST BOTTLES DRAWN AEROBIC AND ANAEROBIC 10CC 11/05/2013 1800   CULT  Value: YEAST Note: Gram Stain Report Called to,Read Back By and Verified With: Geanie Berlin 11/07/13 0648A [facility] Performed at [place] 11/05/2013 1800   REPTSTATUS PENDING 11/05/2013 1800   Dg Chest Port 1 View  11/06/2013   CLINICAL DATA:  Fever question pneumonia, high aspiration risk, history ulcerative colitis, metastatic rectal cancer  EXAM: PORTABLE CHEST - 1 VIEW  COMPARISON:  Portable exam 0812 hr compared to 09/09/2013  FINDINGS: RIGHT jugular Port-A-Cath with tip projecting over distal SVC above cavoatrial junction.   Normal heart size, mediastinal contours and pulmonary vascularity.  Mild bibasilar atelectasis.  No acute infiltrate, pleural effusion or pneumothorax.  Diffuse osseous demineralization noted.  IMPRESSION: Bibasilar atelectasis.   Electronically Signed   By: Lavonia Dana M.D.   On: 11/06/2013 08:36   Recent Results (from the past 240 hour(s))  MRSA PCR SCREENING     Status: None   Collection Time    11/05/13  4:21 PM      Result Value Ref Range Status   MRSA by PCR NEGATIVE  NEGATIVE Final   Comment:            The GeneXpert MRSA Assay (FDA     approved for NASAL specimens     only), is one component of a     comprehensive MRSA colonization     surveillance program. It is not     intended to diagnose MRSA     infection nor to guide or     monitor treatment for     MRSA infections.  CULTURE, BLOOD (ROUTINE X 2)     Status: None   Collection Time    11/05/13  5:50 PM      Result Value Ref Range Status   Specimen Description BLOOD LEFT HAND   Final   Special Requests BOTTLES DRAWN AEROBIC AND ANAEROBIC 10CC   Final   Culture  Setup Time     Final   Value: 11/05/2013 22:08     Performed at [facility]   Culture     Final   Value:        BLOOD CULTURE RECEIVED NO GROWTH TO DATE CULTURE WILL BE HELD FOR 5 DAYS BEFORE ISSUING A FINAL NEGATIVE REPORT     Performed at [facility]   Report Status PENDING   Incomplete  CULTURE, BLOOD (ROUTINE X 2)     Status: None   Collection Time    11/05/13  6:00 PM      Result Value Ref Range Status   Specimen Description BLOOD LEFT ARM   Final   Special Requests BOTTLES DRAWN AEROBIC AND ANAEROBIC 10CC   Final   Culture  Setup Time     Final   Value: 11/05/2013 22:08     Performed at [facility]   Culture     Final   Value: YEAST     Note: Gram Stain Report Called to,Read Back By and Verified With: Geanie Berlin 11/07/13 0648A [place]     Performed at [facility]   Report Status PENDING   Incomplete  11/07/2013, 2:32 PM     LOS: 2 days     **Disclaimer: This note may have been dictated with voice recognition software. Similar sounding words can inadvertently be transcribed and this note may contain transcription errors which may not have been corrected upon publication of note.**

## 2013-11-07 NOTE — Progress Notes (Addendum)
Palliative Care Team at [facility] Note   SUBJECTIVE: Brandon [name] is much improved per his fathers report. He was able to walk to the bathroom this morning. His pain is better- reported at no pain on current fentanyl infusion at 285mcg/hr.  Interval Events: Admitted 1325 from home with opiate toxicity, NV, AMS.  OBJECTIVE: Vital Signs: BP 102/62  Pulse 119  Temp(Src) 97.1 F (36.2 C) (Oral)  Resp 16  Ht 5\' 7"  (1.702 m)  Wt 73.392 kg (161 lb 12.8 oz)  BMI 25.34 kg/m2  SpO2 100%   Intake and Output: 07/28 0701 - 07/29 0700 In: 1064.2 [P.O.:60; I.V.:804.2; IV Piggyback:200] Out: 2385 [Urine:650; Drains:1010; Stool:725]  Physical Exam: General: Vital signs reviewed and noted. Chronically critically ill appearing- cooperative throughout examination. + tremors, tremulous  Head: Normocephalic, atraumatic.  Lungs:  Normal respiratory effort. Clear to auscultation BL without crackles or wheezes.  Heart: RRR. S1 and S2 normal without gallop,  or rubs. (+) murmur  Abdomen:  Leaking from his ostomy, surgical scars, distended  Extremities: +LE and UE edema-1+pitting    Allergies  Allergen Reactions  . Codeine Nausea And Vomiting  . Effexor [Venlafaxine Hcl] Other (See Comments)    Flat Feeling  . Imuran [Azathioprine] Nausea And Vomiting    Medications: Scheduled Meds:  . clonazepam  0.5 mg Oral BID  . dexamethasone  4 mg Oral Q breakfast  . DOBELLS  15 mL Mouth/Throat BID  . enoxaparin (LOVENOX) injection  40 mg Subcutaneous Q24H  . insulin aspart  0-15 Units Subcutaneous Q6H  . micafungin (MYCAMINE) IV  100 mg Intravenous Daily  . pantoprazole  40 mg Oral BID  . senna  1 tablet Oral BID  . tamsulosin  0.4 mg Oral QHS    Continuous Infusions: . sodium chloride 100 mL/hr at 11/06/13 2240  . ADULT TPN 100 mL/hr at 11/06/13 1735  . ADULT TPN    . fentaNYL infusion INTRAVENOUS 250 mcg/hr (11/07/13 0129)    PRN Meds: acetaminophen, acetaminophen, diazepam, feeding  supplement (RESOURCE BREEZE), fentaNYL, haloperidol lactate, promethazine  PPS: 20%  Labs: CBC    Component Value Date/Time   WBC 6.6 11/07/2013 0615   RBC 2.65* 11/07/2013 0615   HGB 7.9* 11/07/2013 0615   HCT 25.9* 11/07/2013 0615   PLT 165 11/07/2013 0615   MCV 97.7 11/07/2013 0615   MCH 29.8 11/07/2013 0615   MCHC 30.5 11/07/2013 0615   RDW 17.0* 11/07/2013 0615   LYMPHSABS 0.6* 11/07/2013 0615   MONOABS 0.4 11/07/2013 0615   EOSABS 0.0 11/07/2013 0615   BASOSABS 0.0 11/07/2013 0615    CMET     Component Value Date/Time   NA 145 11/07/2013 0615   K 4.0 11/07/2013 0615   CL 112 11/07/2013 0615   CO2 22 11/07/2013 0615   GLUCOSE 125* 11/07/2013 0615   BUN 35* 11/07/2013 0615   CREATININE 1.01 11/07/2013 0615   CALCIUM 8.2* 11/07/2013 0615   PROT 5.5* 11/07/2013 0615   ALBUMIN 2.0* 11/07/2013 0615   AST 53* 11/07/2013 0615   ALT 67* 11/07/2013 0615   ALKPHOS 173* 11/07/2013 0615   BILITOT 0.5 11/07/2013 0615   GFRNONAA 80* 11/07/2013 0615   GFRAA >90 11/07/2013 0615   ASSESSMENT/ PLAN: 58 yo with metastatic colon cancer, prior cytoreduction with short gut syndrome and ostomy, has venting PEG for malignant obstruction and is on long term TPN as only source of nutrition.  1. Pain related to Cancer: Doing very well on Fentanyl infusion- has not  required any bolus dosing. Would continue at current rate- [facility] can indeed infuse this at home and I have discussed with their infusion pharmacy. Would like to try SL oxycodone for breakthrough pain instead of IV fentanyl boluses to conserve volume.  2. Goals of Care: Comfort is primary goal but father continues to desire TPN and hopes Jeromy's mental status will clear so he can participate in his own decision making on this topic. Additionally I have discussed his case with [facility] medical director who has agreed to follow as an outpatient with palliative and transition to Hospice when appropriate.   3. Agitation/Tremors:  Will increase dose of Klonopin ODT.  Can supplement this with PRN valium for acute agitation.  4. Nausea: No nausea off of the Zofran, may have been part of opioid toxicity- can use prn phenergan as well. Continue octreotide in his TPN 541mcg per bag.  5. Fungemia: D/W Dr. Johnnye Sima- can observe on 2 weeks of IV antifungals- and based on how Raoul is doing and his goals of care make a decision about replacing his line.   Disposition: Dilyn prefers home-since he has done so well on teh fentanyl infusion I think home with [facility]  following will be very appropriate. [facility] reports they have very good insurance that covers everything and he is worried that a hospice transition may limited their options due to capitated care.  Time: 35 minutes. 11AM-11:35AM Greater than 50%  of this time was spent counseling and coordinating care related to the above assessment and plan.   Acquanetta Chain, DO  11/07/2013, 4:14 PM  Please contact Palliative Medicine Team phone at [phone] for questions and concerns.

## 2013-11-07 NOTE — Progress Notes (Signed)
PARENTERAL NUTRITION CONSULT NOTE - Follow Up  Pharmacy Consult for cyclic TNA - to use home supply (provided by [facility] as outpatient) Indication: hx Crohn's, metastatic rectal CA s/p multiple GI resections with resultant short gut syndrome requiring long term TPN  Allergies  Allergen Reactions  . Codeine Nausea And Vomiting  . Effexor [Venlafaxine Hcl] Other (See Comments)    Flat Feeling  . Imuran [Azathioprine] Nausea And Vomiting    Patient Measurements: Height: 5\' 7"  (170.2 cm) Weight: 161 lb 12.8 oz (73.392 kg) IBW/kg (Calculated) : 66.1  Vital Signs: Temp: 99 F (37.2 C) (07/29 0738) Temp src: Oral (07/29 0738) BP: 103/83 mmHg (07/29 0738) Pulse Rate: 128 (07/29 0738) Intake/Output from previous day: 07/28 0701 - 07/29 0700 In: 1064.2 [P.O.:60; I.V.:804.2; IV Piggyback:200] Out: 2385 [Urine:650; Drains:1010; Stool:725] Intake/Output from this shift: Total I/O In: -  Out: 475 [Stool:475]  Labs:  Recent Labs  11/05/13 1350 11/07/13 0615  WBC 6.2 6.6  HGB 9.7* 7.9*  HCT 31.5* 25.9*  PLT 235 165     Recent Labs  11/05/13 1350 11/06/13 1005 11/07/13 0615  NA 148* 147 145  K 4.1 3.5* 4.0  CL 108 112 112  CO2 24 22 22   GLUCOSE 138* 126* 125*  BUN 30* 24* 35*  CREATININE 1.15 1.16 1.01  CALCIUM 9.8 7.7* 8.2*  MG 2.0  --  2.5  PHOS 4.3  --  2.3  PROT 6.6  --  5.5*  ALBUMIN 2.4*  --  2.0*  AST 49*  --  53*  ALT 63*  --  67*  ALKPHOS 270*  --  173*  BILITOT 0.6  --  0.5  TRIG  --   --  236*   Estimated Creatinine Clearance: 74.5 ml/min (by C-G formula based on Cr of 1.01).    Recent Labs  11/07/13 0006 11/07/13 0322 11/07/13 0827  GLUCAP 203* 156* 100*    Medical History: Past Medical History  Diagnosis Date  . Biliary dyskinesia   . Bowel obstruction   . Ulcerative colitis   . Allergy   . Depression   . Ileostomy in place   . ED (erectile dysfunction)   . Benign positional vertigo   . Prolactinoma   . Hypogonadism  male   . History of blood transfusion   . Pituitary tumor 2009  . 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 sodium chloride 100 mL/hr at 11/06/13 2240  . ADULT TPN 100 mL/hr at 11/06/13 1735  . fentaNYL infusion INTRAVENOUS 250 mcg/hr (11/07/13 0129)   PRN: acetaminophen, acetaminophen, diazepam, feeding supplement (RESOURCE BREEZE), fentaNYL, haloperidol lactate, promethazine  Insulin Requirements in the past 24 hours:  n/a  Current Nutrition:  Long term cyclic TPN as outpatient prior to admission: 12 hr nightly admin of TPN without lipids 6x/week provides 96g protein daily and 1658 kcal/day and TPN with lipids also added only Fridays provides 96g protein and 2658 kcal. 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 Palliative managing patient and patient currently on fentanyl drip. TNA was held last night but per Md to restart TNA tonight and use patient's home supply per Md order.   CBGs: while on TNA: 309, 203, 156. while off TNA: 100 Lytes: stable - K  improved today Liver: LFTs and AlkP slightly elevated but stable and tbili WNL stable Renal: stable Prealbumin: pending TGs: 236 - slightly elevated  Nutritional Goals:  Per RD assessment: Kcal 2000-2200, protein 100-110 g, >= 2000 mL/day  Plan:  1) As stated above, will use patient's own supply of TNA for now unless labs dictate to change to our supply. Patient's father brought in TNA and additives of MVI 52mL and octreotide 560mcg that pharmacy will add to bag. Will discuss with patient need for further TNA bags as needed and will likely have [facility] provide bags for Saturday and Sunday if patient is still admitted. Per [facility], current supply that patient has will expire on Saturday. Note that I  confirmed that Dr. Hilma Favors does want octreotide to continue to be added to patient's TNA 2) Continue cyclic TNA 9509 mL to run over 12 hours at the following rates:  A. 6p-7p: 100 ml/hr  B. 7p-5a: 230 ml/hr  C. 5a-6a: 100 ml/hr 2) MonThurs TNA labs 3) Continue current SSI - CBG elevated after start of TNA but stabilized thru the night - continue to monitor 4) TGs slightly elevated - patient only gets TNA with lipid on Fridays so will continue current management and monitor  Adrian Saran, PharmD, BCPS Pager [phone] 11/07/2013 10:00 AM

## 2013-11-07 NOTE — Progress Notes (Signed)
Wasted 71mL fentanyl gtt down sink, witnessed by Garner Nash

## 2013-11-07 NOTE — Progress Notes (Signed)
PT Cancellation Note  Patient Details Name: NOA CONSTANTE MRN: [id number] DOB: [birth year]   Cancelled Treatment:    Reason Eval/Treat Not Completed: Other (comment) (pt's father reports waiting on Rote for leaking colostomy, prefers not to mobilize yet)   Roshaun Pound,KATHrine E 11/07/2013, 2:27 PM Carmelia Bake, PT, DPT 11/07/2013 Pager: [phone]

## 2013-11-07 NOTE — Progress Notes (Signed)
Progress Note   Brandon [name] GGY:[id number] DOB: 07/15/1955 DOA: 11/05/2013 PCP: [name], MD   Brief Narrative:   Brandon [name] is an 58 y.o. male with a PMH of metastatic rectal cancer treated with 14 cycles of 5-FU, currently under the care of Dr. Marcell Anger (oncology) at [facility] [phone], recent hospitalization for GI bleed, performance status of 3, last seen by his oncologist on 10/24/13 with recommendations for symptom management given his overall frail state. His oncologist attempted to discuss his CODE STATUS with him at that visit, however he did not want to engage in a conversation about this at that time. He has had problems with symptom management including issues related to nausea, anxiety, and pain. He was referred as a direct admission by his palliative care doctor, Dr. Hilma Favors, for symptom management and concerns for opiate toxicity.   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   Dilaudid-HP drip discontinued, now on fentanyl for pain control.  Remains confused but myoclonic jerking spells diminishing.  Active Problems:  Fever / fungemia  Blood cultures sent 11/05/13. One of 2 cultures growing yeast. Will start micafungin.  ID consultation requested, seen by Dr. Johnnye Sima who strongly recommends removing his CVC.  Chest x-ray negative for infiltrates.  Urinalysis negative for nitrites and leukocytes.  Abdominal pain, chronic, right upper quadrant / cancer related pain   Pain management per palliative care team.   Functional quadriplegia   Obtain PT evaluation.  Protein-calorie malnutrition, severe   Continue TNA.  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 Palliative care following.  Dehydration / acute kidney injury / bilateral hydronephrosis  Hydrating with normal saline.  Monitor renal function  periodically.  Elevated LFTs   Likely related to liver and bone metastasis.   Anemia of chronic disease   No current indication for transfusion, but hemoglobin dropped to 7.9. Transfuse for hemoglobin less than 7.  DVT prophylaxis   Lovenox ordered.  Code Status: DNR  Family Communication: Father at bedside.  Disposition Plan: Probable residential hospice placement.    IV Access:    Double lumen CVC right subclavian   Procedures and diagnostic studies:    11/06/13: Chest x-ray: Bibasilar atelectasis. No acute infiltrate.   Medical Consultants:    Dr. Isaac Laud, Palliative Care  Dr. [name], ID   Other Consultants:    Physical therapy   Anti-Infectives:    Micafungin 11/07/13--->   Subjective:   Brandon [name] head shaking chills when I examined him this morning. He remains intermittently confused. Some nausea but no vomiting. No current complaints of pain.  Objective:    Filed Vitals:   11/06/13 0450 11/06/13 0552 11/06/13 1400 11/06/13 2100  BP:  119/84 130/75 100/69  Pulse: 121 144 120 110  Temp: 100.7 F (38.2 C) 99.4 F (37.4 C) 98.6 F (37 C) 97.5 F (36.4 C)  TempSrc: Oral Axillary Oral Oral  Resp: 16 16 16 16   Height:      Weight:      SpO2: 95% 100% 100% 100%    Intake/Output Summary (Last 24 hours) at 11/07/13 0732 Last data filed at 11/07/13 0601  Gross per 24 hour  Intake 1064.16 ml  Output   2385 ml  Net -1320.84 ml    Exam: Gen:  Remains awake/alert but confused, with shaking chills Cardiovascular:  Tachycardic, No M/R/G Respiratory:  Lungs CTAB Gastrointestinal:  Abdomen soft, NT/ND, + BS, +ostomy/PEG Extremities:  3+ edema   Data Reviewed:    Labs: Basic Metabolic Panel:  Recent Labs Lab 11/05/13 1350 11/06/13 1005 11/07/13 0615  NA 148* 147 145  K 4.1 3.5* 4.0  CL 108 112 112  CO2 24 22 22   GLUCOSE 138* 126* 125*  BUN 30* 24* 35*  CREATININE 1.15 1.16 1.01  CALCIUM 9.8 7.7* 8.2*    MG 2.0  --  2.5  PHOS 4.3  --  2.3   GFR Estimated Creatinine Clearance: 74.5 ml/min (by C-G formula based on Cr of 1.01). Liver Function Tests:  Recent Labs Lab 11/05/13 1350 11/07/13 0615  AST 49* 53*  ALT 63* 67*  ALKPHOS 270* 173*  BILITOT 0.6 0.5  PROT 6.6 5.5*  ALBUMIN 2.4* 2.0*   CBC:  Recent Labs Lab 11/05/13 1350 11/07/13 0615  WBC 6.2 6.6  NEUTROABS 5.4 5.7  HGB 9.7* 7.9*  HCT 31.5* 25.9*  MCV 97.2 97.7  PLT 235 165   Microbiology Recent Results (from the past 240 hour(s))  MRSA PCR SCREENING     Status: None   Collection Time    11/05/13  4:21 PM      Result Value Ref Range Status   MRSA by PCR NEGATIVE  NEGATIVE Final   Comment:            The GeneXpert MRSA Assay (FDA     approved for NASAL specimens     only), is one component of a     comprehensive MRSA colonization     surveillance program. It is not     intended to diagnose MRSA     infection nor to guide or     monitor treatment for     MRSA infections.  CULTURE, BLOOD (ROUTINE X 2)     Status: None   Collection Time    11/05/13  5:50 PM      Result Value Ref Range Status   Specimen Description BLOOD LEFT HAND   Final   Special Requests BOTTLES DRAWN AEROBIC AND ANAEROBIC 10CC   Final   Culture  Setup Time     Final   Value: 11/05/2013 22:08     Performed at [facility]   Culture     Final   Value:        BLOOD CULTURE RECEIVED NO GROWTH TO DATE CULTURE WILL BE HELD FOR 5 DAYS BEFORE ISSUING A FINAL NEGATIVE REPORT     Performed at [facility]   Report Status PENDING   Incomplete  CULTURE, BLOOD (ROUTINE X 2)     Status: None   Collection Time    11/05/13  6:00 PM      Result Value Ref Range Status   Specimen Description BLOOD LEFT ARM   Final   Special Requests BOTTLES DRAWN AEROBIC AND ANAEROBIC 10CC   Final   Culture  Setup Time     Final   Value: 11/05/2013 22:08     Performed at [facility]   Culture     Final   Value: YEAST     Note: Gram  Stain Report Called to,Read Back By and Verified With: Geanie Berlin 11/07/13 0648[place]     Performed at [facility]   Report Status PENDING   Incomplete     Medications:   . clonazepam  0.5 mg Oral BID  . dexamethasone  4 mg Oral Q breakfast  .  DOBELLS  15 mL Mouth/Throat BID  . enoxaparin (LOVENOX) injection  40 mg Subcutaneous Q24H  . insulin aspart  0-15 Units Subcutaneous Q6H  . pantoprazole  40 mg Oral BID  . senna  1 tablet Oral BID  . tamsulosin  0.4 mg Oral QHS   Continuous Infusions: . sodium chloride 100 mL/hr at 11/06/13 2240  . ADULT TPN 100 mL/hr at 11/06/13 1735  . fentaNYL infusion INTRAVENOUS 250 mcg/hr (11/07/13 0129)    Time spent: 35 minutes with > 50% of time discussing current diagnostic test results, clinical impression and plan of care and coordination of care with the palliative care team and ID.    LOS: 2 days   RAMA,CHRISTINA  [facility] Pager [phone]. If unable to reach me by pager, please call my cell phone at [phone].  *Please refer to [email or web address], password [email or web address] to get updated schedule on who will round on this patient, as hospitalists switch teams weekly. If 7PM-7AM, please contact night-coverage at [email or web address], password [email or web address] for any overnight needs.  11/07/2013, 7:32 AM    **Disclaimer: This note was dictated with voice recognition software. Similar sounding words can inadvertently be transcribed and this note may contain transcription errors which may not have been corrected upon publication of note.**

## 2013-11-07 NOTE — Consult Note (Addendum)
WOC ostomy consult note Stoma type/location: LLQ loop  ileo jejunostomy (per patient and family). Minimally functioning right upper quadrant stoma with stoma cap in place.  Not changed today.  LLQ stoma output is voluminous and caustic and undermines every barrier attempted for the past several weeks.  Patient has had three pouch changes this shift and it wet at the moment.  Skin distal to the pouching system is erythematous, edematous. Stomal assessment/size: 1 and 5/8 inches with proximal (functioning)  limb in the superior position. Peristomal assessment: erythematous, no open areas despite near continual leakage. Treatment options for stomal/peristomal skin: dusted area with pectin based powder, topped with no-sting skin sealant. Output light green effluent with small stool flecks. Ostomy pouching: 1pc.convex pouching system with two skin barrier rings and an ostomy appliance belt Education provided: Patient and family assured that the nursing staff will exhaust every pouching solution in an attempt to find something to adhere.  Taught that each hour we keep effluent off of the skin is an improvement and that incremental improvements were our goal.  Patient bathed and able to sit up in the chair with LEs elevated and warm blankets placed over him following pouching procedure.  Patient and family relieved at attempt and its apparent (initial) success. A pressure redistribution chair pad is provided as well as pressure redistribution heel boots. [facility] nursing team will follow, and will remain available to this patient, the nursing and medical teams.  Time of this encounter = 120 minutes. Thanks, Maudie Flakes, MSN, RN, [place], [place], [place] [phone])

## 2013-11-07 NOTE — Progress Notes (Signed)
[facility] called. Positive yeast blood culture.  On call provider notified. No new orders at this time. Will continue to monitor.

## 2013-11-07 NOTE — Progress Notes (Signed)
Clinical Social Work Department BRIEF PSYCHOSOCIAL ASSESSMENT 11/07/2013  Patient:  Brandon [name],Brandon [name]     Account Number:  [id number]     Admit date:  11/05/2013  Clinical Social Worker:  Ulyess Blossom  Date/Time:  11/07/2013 04:13 PM  Referred by:  Physician  Date Referred:  11/07/2013 Referred for  Residential hospice placement   Other Referral:   Interview type:  Other - See comment Other interview type:   chart review and discussion with PMT MD and [facility] liaison, Brandon [name]    PSYCHOSOCIAL DATA Living Status:  FAMILY Admitted from facility:   Level of care:   Primary support name:  Brandon [name]/father/[phone] Primary support relationship to patient:  PARENT Degree of support available:   strong    CURRENT CONCERNS Current Concerns  Post-Acute Placement   Other Concerns:    SOCIAL WORK ASSESSMENT / PLAN CSW received referral for residential hospice and that Brandon [name] at [facility] had already been contacted by PMT MD and had [name] referral pending from [name] pt previous hospitalization at another hospital.    Pt family was interested in pursuing hospice care and possibly transfer to inpatient hospice facility for ongoing symptom management related to possible OIN and would benefit form methadone PCA per PMT MD note.    CSW spoke with [facility] liaison, Brandon [name] who stated that pt clinicals were reviewed and [facility] MD spoke with PMT MD and pt not appropriate at present for residential hospice as pt goals of needing to be further clarified. Per [facility] liaison, if pt wants to pursue aggressive treatment then palliative care could be offered to pt in the home or if pt decided upon transitioning to full comfort care then pt could be re-evaluated for appropriateness for residential hospice placement or home hospice care.    CSW spoke with PMT MD, Dr. Hilma [name] who plans to continue goals of care  discussion with pt and pt family and will update CSW regarding clarification of pt goals of care.    CSW to continue to follow and engage family and assist when appropriate.   Assessment/plan status:  Psychosocial Support/Ongoing Assessment of Needs Other assessment/ plan:   discharge planning   Information/referral to community resources:   pending further clarification of goals    PATIENT'S/FAMILY'S RESPONSE TO PLAN OF CARE: Per chart, pt oriented to person only. Per report, pt family supportive. Awaiting further clarification of goals in order to assist as appropriate.    Brandon [name], MSW, [place] Work [phone]

## 2013-11-08 ENCOUNTER — Inpatient Hospital Stay (HOSPITAL_COMMUNITY): Payer: BC Managed Care – PPO

## 2013-11-08 DIAGNOSIS — B49 Unspecified mycosis: Secondary | ICD-10-CM | POA: Diagnosis not present

## 2013-11-08 DIAGNOSIS — C801 Malignant (primary) neoplasm, unspecified: Secondary | ICD-10-CM

## 2013-11-08 DIAGNOSIS — C20 Malignant neoplasm of rectum: Secondary | ICD-10-CM | POA: Diagnosis not present

## 2013-11-08 LAB — GLUCOSE, CAPILLARY
GLUCOSE-CAPILLARY: 213 mg/dL — AB (ref 70–99)
Glucose-Capillary: 101 mg/dL — ABNORMAL HIGH (ref 70–99)
Glucose-Capillary: 140 mg/dL — ABNORMAL HIGH (ref 70–99)
Glucose-Capillary: 202 mg/dL — ABNORMAL HIGH (ref 70–99)

## 2013-11-08 LAB — MAGNESIUM: Magnesium: 2.4 mg/dL (ref 1.5–2.5)

## 2013-11-08 LAB — COMPREHENSIVE METABOLIC PANEL
ALK PHOS: 216 U/L — AB (ref 39–117)
ALT: 62 U/L — AB (ref 0–53)
ANION GAP: 14 (ref 5–15)
AST: 35 U/L (ref 0–37)
Albumin: 2 g/dL — ABNORMAL LOW (ref 3.5–5.2)
BILIRUBIN TOTAL: 0.6 mg/dL (ref 0.3–1.2)
BUN: 30 mg/dL — ABNORMAL HIGH (ref 6–23)
CO2: 18 meq/L — AB (ref 19–32)
Calcium: 8.2 mg/dL — ABNORMAL LOW (ref 8.4–10.5)
Chloride: 110 mEq/L (ref 96–112)
Creatinine, Ser: 0.91 mg/dL (ref 0.50–1.35)
GFR calc Af Amer: >90 mL/min (ref >90)
GLUCOSE: 170 mg/dL — AB (ref 70–99)
POTASSIUM: 4.3 meq/L (ref 3.7–5.3)
SODIUM: 142 meq/L (ref 137–147)
Total Protein: 5.4 g/dL — ABNORMAL LOW (ref 6.0–8.3)

## 2013-11-08 LAB — PHOSPHORUS: Phosphorus: 2.2 mg/dL — ABNORMAL LOW (ref 2.3–4.6)

## 2013-11-08 MED ORDER — TRAVASOL 10 % IV SOLN: INTRAVENOUS | Status: DC

## 2013-11-08 MED ORDER — TRAVASOL 10 % IV SOLN
INTRAVENOUS | Status: DC
  Filled 2013-11-08: qty 960

## 2013-11-08 MED ORDER — DEXTROSE 10 % IV SOLN
INTRAVENOUS | Status: DC
  Administered 2013-11-08 – 2013-11-09 (×5): via INTRAVENOUS

## 2013-11-08 MED ORDER — OCTREOTIDE ACETATE 500 MCG/ML IJ SOLN
250.0000 ug | Freq: Two times a day (BID) | INTRAMUSCULAR | Status: AC
  Administered 2013-11-08 – 2013-11-09 (×2): 250 ug via SUBCUTANEOUS
  Filled 2013-11-08 (×2): qty 0.5

## 2013-11-08 MED ORDER — LIDOCAINE HCL 1 % IJ SOLN
INTRAMUSCULAR | Status: AC
  Filled 2013-11-08: qty 20

## 2013-11-08 MED ORDER — KETOROLAC TROMETHAMINE 30 MG/ML IJ SOLN
30.0000 mg | Freq: Once | INTRAMUSCULAR | Status: AC
  Administered 2013-11-08: 30 mg via INTRAVENOUS
  Filled 2013-11-08: qty 2
  Filled 2013-11-08: qty 1

## 2013-11-08 NOTE — Progress Notes (Addendum)
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 Codeine Nausea And Vomiting  . Effexor [Venlafaxine Hcl] Other (See Comments)    Flat Feeling  . Imuran [Azathioprine] Nausea And Vomiting    Patient Measurements: Height: 5\' 7"  (170.2 cm) Weight: 161 lb 12.8 oz (73.392 kg) IBW/kg (Calculated) : 66.1  Vital Signs: Temp: 101 F (38.3 C) (07/30 0826) Temp src: Oral (07/30 0826) BP: 126/73 mmHg (07/30 0826) Pulse Rate: 138 (07/30 0826) Intake/Output from previous day: 07/29 0701 - 07/30 0700 In: 980 [P.O.:980] Out: 3250 [Urine:325; Drains:800; Stool:2125] Intake/Output from this shift: Total I/O In: -  Out: 100 [Stool:100]  Labs:  Recent Labs  11/05/13 1350 11/07/13 0615  WBC 6.2 6.6  HGB 9.7* 7.9*  HCT 31.5* 25.9*  PLT 235 165     Recent Labs  11/05/13 1350 11/06/13 1005 11/07/13 0615 11/08/13 0522  NA 148* 147 145 142  K 4.1 3.5* 4.0 4.3  CL 108 112 112 110  CO2 24 22 22  18*  GLUCOSE 138* 126* 125* 170*  BUN 30* 24* 35* 30*  CREATININE 1.15 1.16 1.01 0.91  CALCIUM 9.8 7.7* 8.2* 8.2*  MG 2.0  --  2.5 2.4  PHOS 4.3  --  2.3 2.2*  PROT 6.6  --  5.5* 5.4*  ALBUMIN 2.4*  --  2.0* 2.0*  AST 49*  --  53* 35  ALT 63*  --  67* 62*  ALKPHOS 270*  --  173* 216*  BILITOT 0.6  --  0.5 0.6  PREALBUMIN  --   --  9.4*  --   TRIG  --   --  236*  --    Estimated Creatinine Clearance: 82.7 ml/min (by C-G formula based on Cr of 0.91).    Recent Labs  11/07/13 2020 11/08/13 0208 11/08/13 0722  GLUCAP 305* 213* 101*    Medical History: Past Medical History  Diagnosis Date  . Biliary dyskinesia   . Bowel obstruction   . Ulcerative colitis   . Allergy   . Depression   . Ileostomy in place   . ED  (erectile dysfunction)   . Benign positional vertigo   . Prolactinoma   . Hypogonadism male   . History of blood transfusion   . Pituitary tumor 2009  . 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 sodium chloride 100 mL/hr at 11/08/13 0511  . ADULT TPN Stopped (11/08/13 1194)  . fentaNYL infusion INTRAVENOUS 250 mcg/hr (11/07/13 2348)   PRN: acetaminophen, acetaminophen, diazepam, feeding supplement (RESOURCE BREEZE), fentaNYL, haloperidol lactate, promethazine  Insulin Requirements in the past 24 hours:  18 units.  Current Nutrition:  Long term cyclic TPN as outpatient prior to admission: 12 hr nightly admin of TPN without lipids 6x/week provides 96g protein daily and 1658 kcal/day and TPN with lipids also added only Fridays provides 96g protein and 2658 kcal. 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 Palliative managing patient and patient currently on fentanyl drip. TNA was held last night but per Md  to restart TNA tonight and use patient's home supply per Md order.   CBGs: while on TNA: 305, 101, 213, 170. Lytes: wnl except phos trended down. CorrCa 9.8. Liver: LFTs and AlkP cont to imrove. Tbili WNL stable Renal: stable Prealbumin: 9.4 TGs: 236  Nutritional Goals:  Per RD assessment: Kcal 2000-2200, protein 100-110 g, >= 2000 mL/day  Plan:  1) As stated above, will use patient's own supply of TNA for now unless labs dictate to change to our supply. Patient's father brought in TNA and additives of MVI 2mL and octreotide 555mcg that pharmacy will add to bag. Will discuss with patient need for further TNA bags as needed and will likely have [facility] provide bags for Saturday and Sunday if patient is still admitted. Per [facility], current supply  that patient has will expire on Saturday. Note that I confirmed that Dr. Hilma Favors does want octreotide to continue to be added to patient's TNA 2) Continue cyclic TNA 0981 mL to run over 12 hours at the following rates:  A. 6p-7p: 100 ml/hr  B. 7p-5a: 230 ml/hr  C. 5a-6a: 100 ml/hr 2) Mon/Thurs TNA labs 3) Continue current SSI - CBG elevated after start of TNA but SSI brought it down nicely. Continue to monitor 4) Bmet in am.  Romeo Rabon, PharmD, pager [phone]. 11/08/2013,9:54 AM.  Addendum:  TNA will not be infused tonight since PAC is to be removed and there is no other central access. I spoke with Dr. Rockne Menghini. We will infuse D10W the same way TNA has been infused and give octreotide separately.  Romeo Rabon, PharmD, pager [phone]. 11/08/2013,11:20 AM.

## 2013-11-08 NOTE — Progress Notes (Signed)
Progress Note   Brandon [name] PZW:[id number] DOB: 09-Apr-1956 DOA: 11/05/2013 PCP: [name], MD   Brief Narrative:   Brandon [name] is an 58 y.o. male with a PMH of metastatic rectal cancer treated with 14 cycles of 5-FU, currently under the care of Dr. Marcell Anger (oncology) at [facility] [phone], recent hospitalization for GI bleed, performance status of 3, last seen by his oncologist on 10/24/13 with recommendations for symptom management given his overall frail state. His oncologist attempted to discuss his CODE STATUS with him at that visit, however he did not want to engage in a conversation about this at that time. He has had problems with symptom management including issues related to nausea, anxiety, and pain. He was referred as a direct admission by his palliative care doctor, Dr. Hilma Favors, for symptom management and concerns for opiate toxicity.   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   Dilaudid-HP drip discontinued, now on fentanyl for pain control.  Symptoms markedly improved, only occasional myoclonus, more awake and alert.  Active Problems:  Fever / fungemia  Blood cultures sent 11/05/13. One of 2 cultures growing yeast. Continue micafungin.  ID consultation requested, seen by Dr. Johnnye Sima who strongly recommends removing his CVC.  Given his ongoing rigors/fever, will discontinue CVC. Sensitivity for culture.  Chest x-ray negative for infiltrates.  Urinalysis negative for nitrites and leukocytes.  Abdominal pain, chronic, right upper quadrant / cancer related pain   Pain management per palliative care team.   Functional quadriplegia   Obtain PT evaluation.  Protein-calorie malnutrition, severe   Continue TNA (may need to hold temporarily secondary to venous access issues).  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 Palliative care following.  Dehydration / acute kidney injury / bilateral hydronephrosis  Hydrating with normal saline.  Monitor renal function periodically.  Elevated LFTs   Likely related to liver and bone metastasis.   Anemia of chronic disease   No current indication for transfusion, but hemoglobin dropped to 7.9. Transfuse for hemoglobin less than 7.  DVT prophylaxis   Lovenox ordered.  Code Status: DNR  Family Communication: Father at bedside.  Disposition Plan: Home versus residential hospice placement.    IV Access:    Double lumen CVC right subclavian   Procedures and diagnostic studies:    11/06/13: Chest x-ray: Bibasilar atelectasis. No acute infiltrate.   Medical Consultants:    Dr. Isaac Laud, Palliative Care  Dr. [name], ID   Other Consultants:    Physical therapy   Anti-Infectives:    Micafungin 11/07/13--->   Subjective:   Brandon [name] had another episode of rigors and spiked a fever again this morning. He is more mentally clear, awake and alert. Some nausea but no vomiting. No current complaints of pain.  Objective:    Filed Vitals:   11/07/13 1430 11/07/13 2020 11/08/13 0506 11/08/13 0826  BP: 102/62 94/81 110/69 126/73  Pulse: 119 108 118 138  Temp: 97.1 F (36.2 C) 97.5 F (36.4 C) 98.9 F (37.2 C) 101 F (38.3 C)  TempSrc: Oral Oral Oral Oral  Resp: 16 18 16 17   Height:      Weight:      SpO2: 100% 100% 97% 99%    Intake/Output Summary (Last 24 hours) at 11/08/13 0959 Last data filed at 11/08/13 0927  Gross per 24 hour  Intake  740 ml  Output   2875 ml  Net  -2135 ml    Exam: Gen:  Awake and alert  Cardiovascular:  Tachycardic, No M/R/G Chest: Tunneled catheter right upper chest without erythema or purulence at the catheter insertion site Respiratory:  Lungs CTAB Gastrointestinal:  Abdomen soft, NT/ND, + BS, +ostomy/PEG Extremities:  3+ lower extremity edema  extending to scrotum   Data Reviewed:    Labs: Basic Metabolic Panel:  Recent Labs Lab 11/05/13 1350 11/06/13 1005 11/07/13 0615 11/08/13 0522  NA 148* 147 145 142  K 4.1 3.5* 4.0 4.3  CL 108 112 112 110  CO2 24 22 22  18*  GLUCOSE 138* 126* 125* 170*  BUN 30* 24* 35* 30*  CREATININE 1.15 1.16 1.01 0.91  CALCIUM 9.8 7.7* 8.2* 8.2*  MG 2.0  --  2.5 2.4  PHOS 4.3  --  2.3 2.2*   GFR Estimated Creatinine Clearance: 82.7 ml/min (by C-G formula based on Cr of 0.91). Liver Function Tests:  Recent Labs Lab 11/05/13 1350 11/07/13 0615 11/08/13 0522  AST 49* 53* 35  ALT 63* 67* 62*  ALKPHOS 270* 173* 216*  BILITOT 0.6 0.5 0.6  PROT 6.6 5.5* 5.4*  ALBUMIN 2.4* 2.0* 2.0*   CBC:  Recent Labs Lab 11/05/13 1350 11/07/13 0615  WBC 6.2 6.6  NEUTROABS 5.4 5.7  HGB 9.7* 7.9*  HCT 31.5* 25.9*  MCV 97.2 97.7  PLT 235 165   Microbiology Recent Results (from the past 240 hour(s))  MRSA PCR SCREENING     Status: None   Collection Time    11/05/13  4:21 PM      Result Value Ref Range Status   MRSA by PCR NEGATIVE  NEGATIVE Final   Comment:            The GeneXpert MRSA Assay (FDA     approved for NASAL specimens     only), is one component of a     comprehensive MRSA colonization     surveillance program. It is not     intended to diagnose MRSA     infection nor to guide or     monitor treatment for     MRSA infections.  CULTURE, BLOOD (ROUTINE X 2)     Status: None   Collection Time    11/05/13  5:50 PM      Result Value Ref Range Status   Specimen Description BLOOD LEFT HAND   Final   Special Requests BOTTLES DRAWN AEROBIC AND ANAEROBIC 10CC   Final   Culture  Setup Time     Final   Value: 11/05/2013 22:08     Performed at [facility]   Culture     Final   Value: YEAST     Note: Gram Stain Report Called to,Read Back By and Verified With: Tempie Donning RN [phone]     Performed at [facility]   Report Status PENDING   Incomplete  CULTURE,  BLOOD (ROUTINE X 2)     Status: None   Collection Time    11/05/13  6:00 PM      Result Value Ref Range Status   Specimen Description BLOOD LEFT ARM   Final   Special Requests BOTTLES DRAWN AEROBIC AND ANAEROBIC 10CC   Final   Culture  Setup Time     Final   Value: 11/05/2013 22:08     Performed at [place]     Final  Value: YEAST     Note: Gram Stain Report Called to,Read Back By and Verified With: Geanie Berlin 11/07/13 0648A [place]     Performed at [facility]   Report Status PENDING   Incomplete     Medications:   . clonazepam  1 mg Oral BID  . dexamethasone  4 mg Oral Q breakfast  . DOBELLS  15 mL Mouth/Throat BID  . enoxaparin (LOVENOX) injection  40 mg Subcutaneous Q24H  . insulin aspart  0-15 Units Subcutaneous Q6H  . micafungin (MYCAMINE) IV  100 mg Intravenous Daily  . pantoprazole  40 mg Oral BID  . senna  1 tablet Oral BID  . tamsulosin  0.4 mg Oral QHS   Continuous Infusions: . sodium chloride 100 mL/hr at 11/08/13 0511  . ADULT TPN Stopped (11/08/13 2162)  . ADULT TPN    . fentaNYL infusion INTRAVENOUS 250 mcg/hr (11/07/13 2348)    Time spent: 35 minutes with > 50% of time discussing current diagnostic test results, clinical impression and plan of care and coordination of care with the palliative care team and ID.    LOS: 3 days   [facility] Pager [phone]. If unable to reach me by pager, please call my cell phone at [phone].  *Please refer to [email or web address], password [email or web address] to get updated schedule on who will round on this patient, as hospitalists switch teams weekly. If 7PM-7AM, please contact night-coverage at [email or web address], password [email or web address] for any overnight needs.  11/08/2013, 9:59 AM    **Disclaimer: This note was dictated with voice recognition software. Similar sounding words can inadvertently be transcribed and this note may contain transcription errors which may not have been corrected upon  publication of note.**

## 2013-11-08 NOTE — Progress Notes (Signed)
Patient with shaking chills noted.  Vitals obtained. BP 126/73  Pulse 138  Temp(Src) 101 F (38.3 C) (Oral)  Resp 17  Ht 5\' 7"  (1.702 m)  Wt 161 lb 12.8 oz (73.392 kg)  BMI 25.34 kg/m2  SpO2 99% Dr. Rockne Menghini notified and order received.  Zandra Abts [facility]  11/08/2013  8:34 AM

## 2013-11-08 NOTE — Procedures (Addendum)
Tunneled right  IJ catheter removed in its entirety without immediate complications. Vaseline gauze dressing applied to site. Medications utilized- 1% lidocaine to skin /SQ tissues. Cath tip was submitted for culture.

## 2013-11-08 NOTE — Progress Notes (Signed)
NUTRITION FOLLOW UP  Intervention:   -Encouraged pleasure feeds of Soft diet as tolerated -Continue home TPN regimen per pharmacy -Discontinue Resource Breeze PRN, offered additional supplement options -Will continue to monitor  Nutrition Dx:   Increased nutrient needs (protein/kcal) related to increased demand for nutrients as evidenced by chronic disease-rectal cancer; ongoing   Goal:   TPN to meet >/= 90% of their estimated nutrition needs; met    Monitor:   TPN tolerance, total protein/energy intake, labs, weights, GI profile  Assessment:   7/28: -Pt has been on TPN for over one year per previous medical records  -[facility] records provided by pharmacy. Home TPN regimen indicates pt receives 2500 ml over 24 hrs w/out lipids for 6 days/wk, which provides 1658 kcal, 96 gram protein.  Pt receives TPN 2500 ml over 24 hours with lipids for one day/wk which provides 2658 kcal, 96 gram protein.  -Provides average of 1800 kcal (90% est kcal needs), and 96 gram protein (96% est protein needs) per week  -Family reported pt with minimal PO intake for past one month. Consumes mostly liquids- sprite, coke. Had been able to tolerate 1-2 bites of foods (peaches, ground meats) in previous months. Did not consume nutrition supplements  -Pt with fluid accumulation in lower extremities, therefore difficulty to determine accurate current weight; however family denied any unintentional wt loss while receiving TPN  -Discussed pt with pharmacy, MD and family wish to resume home TPN formula during admit. Will likely initiate later this afternoon (7/28) -Pt lethargic and agitated upon admit. Will order Resource Breeze BID PRN pending on pt's alert level  -Phos/Mg/K WNL -CBG's < 151m/dl    7/30: -Pt receiving home TPN regimen, which is being provided by patient's father -Per pharmacy note, pt to continue cyclic TNA 26945mL to run over 12 hours at the following rates:   - 6p-7p: 100 ml/hr    -7p-5a: 230 ml/hr   -5a-6a: 100 ml/hr -Per palliative care team, goal is comfort; family wishes to continue TPN as sole source of nutrition to possibily assist in patient's decision making capabilities -Pt's father reported pt with fair appetite yesterday, was able to consume > 50% of pancakes, peaches, and pudding; has decreased appetite today d/t fevers and chills. Discussed possible addition of MagicCup with pt's meals, father in agreement and planned to trial supplement as tolerated. Will d/c Resource Breeze PRN -WOC evaluated pt for LLQ ileojejeunostomy on 7/29 and 7/30, has frequent high output. Being changed by RN hourly -CBG's were elevated upon initiation of TPN; however are now < 150 mg/dl. Octreotide also likely contributing to increased blood glucose -Phos slightly low -Triglycerides elevated, pt receives lipid one day/wk. Will continue to monitor   Height: Ht Readings from Last 1 Encounters:  11/05/13 _0  (1.702 m)    Weight Status:   Wt Readings from Last 1 Encounters:  11/05/13 161 lb 12.8 oz (73.392 kg)    Re-estimated needs:  Kcal: 2000-2200  Protein: 100-110 gram  Fluid: >/=2000 ml/daily   Skin: +4 RLE and LLE edema  Diet Order: BCriss Rosales  Intake/Output Summary (Last 24 hours) at 11/08/13 0925 Last data filed at 11/08/13 0523  Gross per 24 hour  Intake    740 ml  Output   2775 ml  Net  -2035 ml    Last BM: 7/29   Labs:   Recent Labs Lab 11/05/13 1350 11/06/13 1005 11/07/13 0615 11/08/13 0522  NA 148* 147 145 142  K 4.1 3.5*  4.0 4.3  CL 108 112 112 110  CO2 _0 18*  BUN 30* 24* 35* 30*  CREATININE 1.15 1.16 1.01 0.91  CALCIUM 9.8 7.7* 8.2* 8.2*  MG 2.0  --  2.5 2.4  PHOS 4.3  --  2.3 2.2*  GLUCOSE 138* 126* 125* 170*    CBG (last 3)   Recent Labs  11/07/13 2020 11/08/13 0208 11/08/13 0722  GLUCAP 305* 213* 101*    Scheduled Meds: . clonazepam  1 mg Oral BID  . dexamethasone  4 mg Oral Q breakfast  . DOBELLS  15 mL  Mouth/Throat BID  . enoxaparin (LOVENOX) injection  40 mg Subcutaneous Q24H  . insulin aspart  0-15 Units Subcutaneous Q6H  . micafungin (MYCAMINE) IV  100 mg Intravenous Daily  . pantoprazole  40 mg Oral BID  . senna  1 tablet Oral BID  . tamsulosin  0.4 mg Oral QHS    Continuous Infusions: . sodium chloride 100 mL/hr at 11/08/13 0511  . ADULT TPN Stopped (11/08/13 2633)  . fentaNYL infusion INTRAVENOUS 250 mcg/hr (11/07/13 2348)    Atlee Abide MS RD LDN Clinical Dietitian [phone]

## 2013-11-08 NOTE — Progress Notes (Signed)
Clinical Social Work  Per chart review and discussed case with CM who reports patient plans to DC home with [facility] services. CSW is signing off but available if further needs arise.  Sindy Messing, LCSW (Coverage for [employer])

## 2013-11-08 NOTE — Consult Note (Addendum)
WOC ostomy follow up Pouch continues to be intact.  Patient has ambulated in the hallway.  Wear time is currently 21 hours. [place] nursing team will follow, and will remain available to this patient, the nursing and medical team. Thanks, Maudie Flakes, MSN, RN, [place], [place], [place] [phone])

## 2013-11-08 NOTE — Progress Notes (Signed)
INFECTIOUS DISEASE PROGRESS NOTE  ID: Brandon [name] is a 58 y.o. 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 clonazepam  1 mg Oral BID  . dexamethasone  4 mg Oral Q breakfast  . DOBELLS  15 mL Mouth/Throat BID  . enoxaparin (LOVENOX) injection  40 mg Subcutaneous Q24H  . insulin aspart  0-15 Units Subcutaneous Q6H  . micafungin (MYCAMINE) IV  100 mg Intravenous Daily  . octreotide  250 mcg Subcutaneous Q12H  . pantoprazole  40 mg Oral BID  . senna  1 tablet Oral BID  . tamsulosin  0.4 mg Oral QHS    Objective: Vital signs in last 24 hours: Temp:  [97.1 F (36.2 C)-101 F (38.3 C)] 98.6 F (37 C) (07/30 1410) Pulse Rate:  [105-138] 105 (07/30 1410) Resp:  [16-18] 17 (07/30 1410) BP: (94-126)/(62-81) 98/67 mmHg (07/30 1410) SpO2:  [97 %-100 %] 100 % (07/30 1410)   General appearance: alert, cooperative and no distress Resp: clear to auscultation bilaterally Chest wall: porta-cath site is clean, non-tender, no d/c.  Cardio: regular rate and rhythm abd-bs+, soft, nontender  Lab Results  Recent Labs  11/07/13 0615 11/08/13 0522  WBC 6.6  --   HGB 7.9*  --   HCT 25.9*  --   NA 145 142  K 4.0 4.3  CL 112 110  CO2 22 18*  BUN 35* 30*  CREATININE 1.01 0.91   Liver Panel  Recent Labs  11/07/13 0615 11/08/13 0522  PROT  5.5* 5.4*  ALBUMIN 2.0* 2.0*  AST 53* 35  ALT 67* 62*  ALKPHOS 173* 216*  BILITOT 0.5 0.6   Sedimentation Rate No results found for this basename: ESRSEDRATE,  in the last 72 hours C-Reactive Protein No results found for this basename: CRP,  in the last 72 hours  Microbiology: Recent Results (from the past 240 hour(s))  MRSA PCR SCREENING     Status: None   Collection Time    11/05/13  4:21 PM      Result Value Ref Range Status   MRSA by PCR NEGATIVE  NEGATIVE Final   Comment:            The GeneXpert MRSA Assay (FDA     approved for NASAL specimens     only), is one component of a     comprehensive MRSA colonization     surveillance program. It is not     intended to diagnose MRSA     infection nor to guide or     monitor treatment for     MRSA infections.  CULTURE, BLOOD (ROUTINE X 2)     Status: None   Collection Time    11/05/13  5:50 PM      Result Value Ref Range Status  Specimen Description BLOOD LEFT HAND   Final   Special Requests BOTTLES DRAWN AEROBIC AND ANAEROBIC 10CC   Final   Culture  Setup Time     Final   Value: 11/05/2013 22:08     Performed at [facility]   Culture     Final   Value: YEAST     Note: Gram Stain Report Called to,Read Back By and Verified With: Brandon Donning RN [phone]     Performed at [facility]   Report Status PENDING   Incomplete  CULTURE, BLOOD (ROUTINE X 2)     Status: None   Collection Time    11/05/13  6:00 PM      Result Value Ref Range Status   Specimen Description BLOOD LEFT ARM   Final   Special Requests BOTTLES DRAWN AEROBIC AND ANAEROBIC 10CC   Final   Culture  Setup Time     Final   Value: 11/05/2013 22:08     Performed at [facility]   Culture     Final   Value: YEAST     Note: Gram Stain Report Called to,Read Back By and Verified With: Brandon [name] 11/07/13 0648A [place]     Performed at [facility]   Report Status PENDING   Incomplete    Studies/Results: No results  found.   Assessment/Plan: Fungemia  Rectal Cancer, stage IV  Total days of antibiotics: 2 (micafungin)  H[place]man removed today after episode of, fever, rigors with infusion.  My great appreciation to Dr Brandon [name] Continue micafungin, plan for 2 weeks post removal of port Plan to repeat BCx in AM Consider PIC line after repeat BCx are negative.          Brandon [name] Infectious Diseases (pager) [phone] [email or web address] 11/08/2013, 2:14 PM  LOS: 3 days   **Disclaimer: This note may have been dictated with voice recognition software. Similar sounding words can inadvertently be transcribed and this note may contain transcription errors which may not have been corrected upon publication of note.**

## 2013-11-08 NOTE — Consult Note (Signed)
WOC ostomy follow up Stoma type/location: LLQ loop ileo jejunostomy Stomal assessment/size: 1 and 5/8 inches with superior limb as the proximal (or functioning) limb Peristomal assessment: Pouching system applied yesterday is still intact with nursing staff emptying every hour.  Patient resting. Pouching System: 1pc convex pouching system with two skin barrier rings and an ostomy appliance belt.  Enrolled patient in [place] Start Discharge program: Yes [place] nursing team will follow, and will remain available to this patient, the nursing and medical teams.   Thanks, Maudie Flakes, MSN, RN, [place], [place], [place] [phone])

## 2013-11-08 NOTE — Evaluation (Signed)
Physical Therapy Evaluation Patient Details Name: Brandon [name] MRN: [id number] DOB: 11-Apr-1956 Today's Date: 11/08/2013   History of Present Illness  Pt is a 58 y.o. 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  Clinical Impression  Pt admitted with above. Pt currently with functional limitations due to the deficits listed below (see PT Problem List).  Pt will benefit from skilled PT to increase their independence and safety with mobility to allow discharge to the venue listed below.  Will continue to assist pt with mobility in acute care so family can safely assist pt at home upon d/c.     Follow Up Recommendations No PT follow up    Equipment Recommendations  None recommended by PT    Recommendations for Other Services       Precautions / Restrictions Precautions Precautions: Fall Precaution Comments: venting PEG      Mobility  Bed Mobility Overal bed mobility: Needs Assistance Bed Mobility: Supine to Sit     Supine to sit: Min assist;HOB elevated     General bed mobility comments: verbal and tactile cues for technique, assist for trunk upright  Transfers Overall transfer level: Needs assistance Equipment used: Rolling walker (2 wheeled) Transfers: Sit to/from Stand Sit to Stand: Min guard         General transfer comment: min/guard for safety, verbal cues for safe technique  Ambulation/Gait Ambulation/Gait assistance: Min guard Ambulation Distance (Feet): 80 Feet Assistive device: Rolling walker (2 wheeled) Gait Pattern/deviations: Step-through pattern;Trunk flexed     General Gait Details: verbal cues of use of RW, distance limited by fatigue  Stairs            Wheelchair Mobility    Modified Rankin (Stroke Patients Only)       Balance                                             Pertinent Vitals/Pain Reports feeling better, no pain reported, ostomy bag opened end of ambulation and RN notified to come assess (pt left with nsg tech for bathing)    Home Living Family/patient expects to be discharged to:: Private residence Living Arrangements: Parent Available Help at Discharge: Family Type of Home: House Home Access: Stairs to enter   Technical brewer of Steps: 2-3 Home Layout: Able to live on main level with bedroom/bathroom Home Equipment: Environmental consultant - 2 wheels;Bedside commode      Prior Function Level of Independence: Independent         Comments: independent mobility, father reports supervision for some cognitive tasks     Hand Dominance        Extremity/Trunk Assessment               Lower Extremity Assessment: Generalized weakness         Communication   Communication: No difficulties  Cognition Arousal/Alertness: Awake/alert Behavior During Therapy: Flat affect Overall Cognitive Status: Within Functional Limits for tasks assessed                      General Comments      Exercises        Assessment/Plan    PT Assessment Patient needs continued PT services  PT Diagnosis Generalized weakness;Difficulty  walking   PT Problem List Decreased strength;Decreased activity tolerance;Decreased mobility;Decreased knowledge of use of DME  PT Treatment Interventions DME instruction;Gait training;Functional mobility training;Therapeutic activities;Therapeutic exercise;Patient/family education   PT Goals (Current goals can be found in the Care Plan section) Acute Rehab PT Goals Patient Stated Goal: father would like pt to return to ambulating without physical assist PT Goal Formulation: With patient/family Time For Goal Achievement: 11/15/13 Potential to Achieve Goals: Good    Frequency Min 3X/week   Barriers to discharge        Co-evaluation               End of Session   Activity  Tolerance: Patient limited by fatigue Patient left: with nursing/sitter in room (in bathroom for bathing with nsg tech)           Time: 8099-8338 PT Time Calculation (min): 17 min   Charges:   PT Evaluation $Initial PT Evaluation Tier I: 1 Procedure PT Treatments $Gait Training: 8-22 mins   PT G Codes:          Telitha Plath,KATHrine E 11/08/2013, 11:00 AM Carmelia Bake, PT, DPT 11/08/2013 Pager: [phone]

## 2013-11-09 DIAGNOSIS — E43 Unspecified severe protein-calorie malnutrition: Secondary | ICD-10-CM | POA: Diagnosis not present

## 2013-11-09 DIAGNOSIS — R1011 Right upper quadrant pain: Secondary | ICD-10-CM

## 2013-11-09 DIAGNOSIS — C20 Malignant neoplasm of rectum: Secondary | ICD-10-CM | POA: Diagnosis not present

## 2013-11-09 DIAGNOSIS — R509 Fever, unspecified: Secondary | ICD-10-CM

## 2013-11-09 DIAGNOSIS — R609 Edema, unspecified: Secondary | ICD-10-CM | POA: Diagnosis not present

## 2013-11-09 DIAGNOSIS — G8929 Other chronic pain: Secondary | ICD-10-CM

## 2013-11-09 DIAGNOSIS — B49 Unspecified mycosis: Secondary | ICD-10-CM | POA: Diagnosis not present

## 2013-11-09 LAB — CBC
HEMATOCRIT: 24.9 % — AB (ref 39.0–52.0)
HEMOGLOBIN: 7.8 g/dL — AB (ref 13.0–17.0)
MCH: 29.3 pg (ref 26.0–34.0)
MCHC: 31.3 g/dL (ref 30.0–36.0)
MCV: 93.6 fL (ref 78.0–100.0)
Platelets: 142 10*3/uL — ABNORMAL LOW (ref 150–400)
RBC: 2.66 MIL/uL — AB (ref 4.22–5.81)
RDW: 17.1 % — ABNORMAL HIGH (ref 11.5–15.5)
WBC: 7.7 10*3/uL (ref 4.0–10.5)

## 2013-11-09 LAB — BASIC METABOLIC PANEL
ANION GAP: 12 (ref 5–15)
BUN: 22 mg/dL (ref 6–23)
CHLORIDE: 105 meq/L (ref 96–112)
CO2: 19 meq/L (ref 19–32)
Calcium: 7.5 mg/dL — ABNORMAL LOW (ref 8.4–10.5)
Creatinine, Ser: 0.93 mg/dL (ref 0.50–1.35)
GFR calc Af Amer: >90 mL/min (ref >90)
GFR calc non Af Amer: >90 mL/min (ref >90)
GLUCOSE: 217 mg/dL — AB (ref 70–99)
POTASSIUM: 3.7 meq/L (ref 3.7–5.3)
SODIUM: 136 meq/L — AB (ref 137–147)

## 2013-11-09 LAB — GLUCOSE, CAPILLARY
GLUCOSE-CAPILLARY: 287 mg/dL — AB (ref 70–99)
Glucose-Capillary: 108 mg/dL — ABNORMAL HIGH (ref 70–99)
Glucose-Capillary: 112 mg/dL — ABNORMAL HIGH (ref 70–99)
Glucose-Capillary: 187 mg/dL — ABNORMAL HIGH (ref 70–99)

## 2013-11-09 MED ORDER — FUROSEMIDE 10 MG/ML IJ SOLN
40.0000 mg | Freq: Two times a day (BID) | INTRAMUSCULAR | Status: DC
  Administered 2013-11-09 – 2013-11-11 (×5): 40 mg via INTRAVENOUS
  Filled 2013-11-09 (×7): qty 4

## 2013-11-09 MED ORDER — DEXTROSE 10 % IV SOLN
2500.0000 mL | INTRAVENOUS | Status: DC
  Administered 2013-11-09: 1000 mL via INTRAVENOUS
  Administered 2013-11-10: 2500 mL via INTRAVENOUS
  Filled 2013-11-09 (×3): qty 3000

## 2013-11-09 MED ORDER — POTASSIUM CHLORIDE IN NACL 40-0.9 MEQ/L-% IV SOLN: INTRAVENOUS | Status: DC

## 2013-11-09 MED ORDER — POTASSIUM CHLORIDE IN NACL 40-0.9 MEQ/L-% IV SOLN
INTRAVENOUS | Status: DC
  Administered 2013-11-10: 100 mL/h via INTRAVENOUS
  Filled 2013-11-09: qty 1000

## 2013-11-09 MED ORDER — POTASSIUM CHLORIDE IN NACL 40-0.9 MEQ/L-% IV SOLN
INTRAVENOUS | Status: DC
  Filled 2013-11-09 (×2): qty 1000

## 2013-11-09 MED ORDER — GERHARDT'S BUTT CREAM
TOPICAL_CREAM | Freq: Two times a day (BID) | CUTANEOUS | Status: DC
  Administered 2013-11-09 – 2013-11-12 (×5): via TOPICAL
  Filled 2013-11-09: qty 1

## 2013-11-09 MED ORDER — POTASSIUM CHLORIDE IN NACL 40-0.9 MEQ/L-% IV SOLN
INTRAVENOUS | Status: AC
  Administered 2013-11-09: 100 mL/h via INTRAVENOUS
  Filled 2013-11-09: qty 1000

## 2013-11-09 MED ORDER — ALBUMIN HUMAN 25 % IV SOLN
12.5000 g | Freq: Four times a day (QID) | INTRAVENOUS | Status: DC
  Administered 2013-11-09 – 2013-11-10 (×4): 12.5 g via INTRAVENOUS
  Filled 2013-11-09 (×8): qty 50

## 2013-11-09 NOTE — Progress Notes (Signed)
Wasted 69mL fentanyl gtt down sink, witnessed by [employer]

## 2013-11-09 NOTE — Progress Notes (Signed)
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 Codeine Nausea And Vomiting  . Effexor [Venlafaxine Hcl] Other (See Comments)    Flat Feeling  . Imuran [Azathioprine] Nausea And Vomiting    Patient Measurements: Height: 5\' 7"  (170.2 cm) Weight: 161 lb 12.8 oz (73.392 kg) IBW/kg (Calculated) : 66.1  Vital Signs: Temp: 97.7 F (36.5 C) (07/31 0558) Temp src: Oral (07/31 0558) BP: 106/74 mmHg (07/31 0558) Pulse Rate: 94 (07/31 0558) Intake/Output from previous day: 07/30 0701 - 07/31 0700 In: 2690 [P.O.:1560; I.V.:1000; IV Piggyback:100] Out: 3000 [Urine:425; Drains:200; Stool:2375] Intake/Output from this shift:    Labs:  Recent Labs  11/07/13 0615 11/09/13 0420  WBC 6.6 7.7  HGB 7.9* 7.8*  HCT 25.9* 24.9*  PLT 165 142*     Recent Labs  11/07/13 0615 11/08/13 0522 11/09/13 0420  NA 145 142 136*  K 4.0 4.3 3.7  CL 112 110 105  CO2 22 18* 19  GLUCOSE 125* 170* 217*  BUN 35* 30* 22  CREATININE 1.01 0.91 0.93  CALCIUM 8.2* 8.2* 7.5*  MG 2.5 2.4  --   PHOS 2.3 2.2*  --   PROT 5.5* 5.4*  --   ALBUMIN 2.0* 2.0*  --   AST 53* 35  --   ALT 67* 62*  --   ALKPHOS 173* 216*  --   BILITOT 0.5 0.6  --   PREALBUMIN 9.4*  --   --   TRIG 236*  --   --    Estimated Creatinine Clearance: 80.9 ml/min (by C-G formula based on Cr of 0.93).    Recent Labs  11/08/13 2013 11/09/13 0204 11/09/13 0824  GLUCAP 202* 287* 108*    Medical History: Past Medical History  Diagnosis Date  . Biliary dyskinesia   . Bowel obstruction   . Ulcerative colitis   . Allergy   . Depression   . Ileostomy in place   . ED (erectile dysfunction)   . Benign positional vertigo   . Prolactinoma   . Hypogonadism male   .  History of blood transfusion   . Pituitary tumor 2009  . 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 sodium chloride 100 mL/hr at 11/09/13 0530  . dextrose 100 mL/hr at 11/09/13 0512  . fentaNYL infusion INTRAVENOUS 250 mcg/hr (11/09/13 0634)   PRN: acetaminophen, acetaminophen, diazepam, fentaNYL, haloperidol lactate, promethazine  Insulin Requirements in the past 24 hours:  15 units while off TPN on D10%.  Current Nutrition:  Long term cyclic TPN as outpatient prior to admission: 12 hr nightly admin of TPN without lipids 6x/week provides 96g protein daily and 1658 kcal/day and TPN with lipids also added only Fridays provides 96g protein and 2658 kcal. 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 Palliative managing patient and patient currently on fentanyl drip. TNA was held last night but per Md to restart TNA tonight and use patient's home supply per Md order.   7/30: PAC removed, TPN held.  CBGs: off TPN now -  590-931. Lytes: wnl except phos trended down. CorrCa 9.8. Liver: LFTs and AlkP cont to imrove. Tbili WNL stable Renal: stable Prealbumin: 9.4 TGs: 236  Nutritional Goals:  Per RD assessment: Kcal 2000-2200, protein 100-110 g, >= 2000 mL/day  Plan:   As stated above, will use patient's own supply of TNA for now unless labs dictate to change to our supply. Patient's father brought in TNA and additives of MVI 17mL and octreotide 552mcg that pharmacy will add to bag. Will discuss with patient need for further TNA bags as needed and will likely have [facility] provide bags for Saturday and Sunday if patient is still admitted. Per [facility], current supply that patient has will expire on Saturday. Not adding octreotide to TPN per Dr. Hilma Favors.  Cont  holding TPN until central access is re-established.  D10W infused over night as same as TPN:  A. 6p-7p: 100 ml/hr   B. 7p-5a: 230 ml/hr   C. 5a-6a: 100 ml/hr  Continue Mod SSI q6h.  Mon/Thurs TNA labs.  Romeo Rabon, PharmD, pager [phone]. 11/09/2013,9:37 AM.

## 2013-11-09 NOTE — Progress Notes (Signed)
Progress Note   Brandon [name] WCB:[id number] DOB: 1955/10/23 DOA: 11/05/2013 PCP: [name], MD   Brief Narrative:   Brandon [name] is an 58 y.o. male with a PMH of metastatic rectal cancer treated with 14 cycles of 5-FU, currently under the care of Dr. Marcell Anger (oncology) at [facility] [phone], recent hospitalization for GI bleed, performance status of 3, last seen by his oncologist on 10/24/13 with recommendations for symptom management given his overall frail state. His oncologist attempted to discuss his CODE STATUS with him at that visit, however he did not want to engage in a conversation about this at that time. He has had problems with symptom management including issues related to nausea, anxiety, and pain. He was referred as a direct admission by his palliative care doctor, Dr. Hilma Favors, for symptom management and concerns for opiate toxicity.   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   Dilaudid-HP drip discontinued, now on fentanyl for pain control.  Symptoms markedly improved, only occasional myoclonus, more awake and alert.  Active Problems:  Fever / fungemia  Blood cultures sent 11/05/13. One of 2 cultures growing yeast. Continue micafungin.  ID consultation requested, seen by Dr. Johnnye Sima who strongly recommends removing his CVC, which was removed 11/08/13.  Followup cultures of the CVC tip.  Anasarca   Start Lasix 40 mg twice a day.  Abdominal pain, chronic, right upper quadrant / cancer related pain   Pain management per palliative care team.   Functional quadriplegia   Obtain PT evaluation.  Protein-calorie malnutrition, severe   TNA on hold until central venous access reestablished.  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 Palliative care following.  Dehydration / acute kidney injury / bilateral  hydronephrosis  Hydrating with normal saline.  Monitor renal function periodically.  Elevated LFTs   Likely related to liver and bone metastasis.   Anemia of chronic disease   No current indication for transfusion, but hemoglobin dropped to 7.9. Transfuse for hemoglobin less than 7.  DVT prophylaxis   Lovenox ordered.  Code Status: DNR  Family Communication: Father at bedside.  Disposition Plan: Home versus residential hospice placement.    IV Access:    Double lumen CVC right subclavian removed 11/08/13.  Peripheral IV   Procedures and diagnostic studies:    11/06/13: Chest x-ray: Bibasilar atelectasis. No acute infiltrate.  11/08/13: Tunneled right IJ catheter removed, catheter tip submitted for culture.   Medical Consultants:    Dr. Isaac Laud, Palliative Care  Dr. [name], ID  Interventional radiology   Other Consultants:    Physical therapy   Anti-Infectives:    Micafungin 11/07/13--->   Subjective:   Brandon [name] has had no further fever or rigors since his CVC was removed. He is having difficulty passing his urine secondary to penile swelling. Has had some sips of liquids this morning, no nausea or vomiting. Pain is controlled. Patient's father is concerned that his mental status has deteriorated and he appears weaker, but he has improved overall since his admission state.  Objective:    Filed Vitals:   11/08/13 1110 11/08/13 1410 11/08/13 2150 11/09/13 0558  BP:  98/67 111/70 106/74  Pulse:  105 96 94  Temp: 98.8 F (37.1 C) 98.6 F (37 C) 97.6 F (36.4 C) 97.7 F (36.5 C)  TempSrc: Axillary Oral Oral Oral  Resp:  17  18 16  Height:      Weight:      SpO2:  100% 100% 98%    Intake/Output Summary (Last 24 hours) at 11/09/13 0727 Last data filed at 11/09/13 0558  Gross per 24 hour  Intake   2690 ml  Output   3000 ml  Net   -310 ml    Exam: Gen:  Awake and alert, sitting up on side of bed but slow to  respond  Cardiovascular:  Tachycardic, No M/R/G Respiratory:  Lungs CTAB Gastrointestinal:  Abdomen soft, NT/ND, + BS, +ostomy/PEG Extremities:  3+ lower extremity edema extending to scrotum   Data Reviewed:    Labs: Basic Metabolic Panel:  Recent Labs Lab 11/05/13 1350 11/06/13 1005 11/07/13 0615 11/08/13 0522 11/09/13 0420  NA 148* 147 145 142 136*  K 4.1 3.5* 4.0 4.3 3.7  CL 108 112 112 110 105  CO2 24 22 22  18* 19  GLUCOSE 138* 126* 125* 170* 217*  BUN 30* 24* 35* 30* 22  CREATININE 1.15 1.16 1.01 0.91 0.93  CALCIUM 9.8 7.7* 8.2* 8.2* 7.5*  MG 2.0  --  2.5 2.4  --   PHOS 4.3  --  2.3 2.2*  --    GFR Estimated Creatinine Clearance: 80.9 ml/min (by C-G formula based on Cr of 0.93). Liver Function Tests:  Recent Labs Lab 11/05/13 1350 11/07/13 0615 11/08/13 0522  AST 49* 53* 35  ALT 63* 67* 62*  ALKPHOS 270* 173* 216*  BILITOT 0.6 0.5 0.6  PROT 6.6 5.5* 5.4*  ALBUMIN 2.4* 2.0* 2.0*   CBC:  Recent Labs Lab 11/05/13 1350 11/07/13 0615 11/09/13 0420  WBC 6.2 6.6 7.7  NEUTROABS 5.4 5.7  --   HGB 9.7* 7.9* 7.8*  HCT 31.5* 25.9* 24.9*  MCV 97.2 97.7 93.6  PLT 235 165 142*   Microbiology Recent Results (from the past 240 hour(s))  MRSA PCR SCREENING     Status: None   Collection Time    11/05/13  4:21 PM      Result Value Ref Range Status   MRSA by PCR NEGATIVE  NEGATIVE Final   Comment:            The GeneXpert MRSA Assay (FDA     approved for NASAL specimens     only), is one component of a     comprehensive MRSA colonization     surveillance program. It is not     intended to diagnose MRSA     infection nor to guide or     monitor treatment for     MRSA infections.  CULTURE, BLOOD (ROUTINE X 2)     Status: None   Collection Time    11/05/13  5:50 PM      Result Value Ref Range Status   Specimen Description BLOOD LEFT HAND   Final   Special Requests BOTTLES DRAWN AEROBIC AND ANAEROBIC 10CC   Final   Culture  Setup Time     Final    Value: 11/05/2013 22:08     Performed at [facility]   Culture     Final   Value: YEAST     Note: Gram Stain Report Called to,Read Back By and Verified With: Tempie Donning RN [phone]     Performed at [facility]   Report Status PENDING   Incomplete  CULTURE, BLOOD (ROUTINE X 2)     Status: None   Collection Time    11/05/13  6:00 PM  Result Value Ref Range Status   Specimen Description BLOOD LEFT ARM   Final   Special Requests BOTTLES DRAWN AEROBIC AND ANAEROBIC 10CC   Final   Culture  Setup Time     Final   Value: 11/05/2013 22:08     Performed at [facility]   Culture     Final   Value: YEAST     Note: Gram Stain Report Called to,Read Back By and Verified With: Geanie Berlin 11/07/13 0648A [place]     Performed at [facility]   Report Status PENDING   Incomplete  CATH TIP CULTURE     Status: None   Collection Time    11/08/13  2:05 PM      Result Value Ref Range Status   Specimen Description CATH TIP   Final   Special Requests Normal   Final   Culture     Final   Value: NO GROWTH 1 DAY     Performed at [facility]   Report Status PENDING   Incomplete     Medications:   . clonazepam  1 mg Oral BID  . dexamethasone  4 mg Oral Q breakfast  . DOBELLS  15 mL Mouth/Throat BID  . enoxaparin (LOVENOX) injection  40 mg Subcutaneous Q24H  . insulin aspart  0-15 Units Subcutaneous Q6H  . micafungin (MYCAMINE) IV  100 mg Intravenous Daily  . octreotide  250 mcg Subcutaneous Q12H  . pantoprazole  40 mg Oral BID  . senna  1 tablet Oral BID  . tamsulosin  0.4 mg Oral QHS   Continuous Infusions: . sodium chloride 100 mL/hr at 11/09/13 0530  . dextrose 100 mL/hr at 11/09/13 0512  . fentaNYL infusion INTRAVENOUS 250 mcg/hr (11/09/13 7106)    Time spent: 25 minutes.    LOS: 4 days   [facility] Pager [phone]. If unable to reach me by pager, please call my cell phone at [phone].  *Please refer to  [email or web address], password [email or web address] to get updated schedule on who will round on this patient, as hospitalists switch teams weekly. If 7PM-7AM, please contact night-coverage at [email or web address], password [email or web address] for any overnight needs.  11/09/2013, 7:27 AM    **Disclaimer: This note was dictated with voice recognition software. Similar sounding words can inadvertently be transcribed and this note may contain transcription errors which may not have been corrected upon publication of note.**

## 2013-11-09 NOTE — Progress Notes (Addendum)
Palliative Medicine Team Progress Note  Acheron appears physically better in terms of his pain and myoclonus from opiate toxicity since being transitioned to a Fentanyl infusion. Georgie says his pain is 0/10 which has not really been the case in several months. His temp spike is very concerning and causing him distress. I have discussed this with his father and with the team and within the context of Kaenan's goals of care it certainly makes sense to go ahead with removing his Hickman. TPN cannot be administered through a peripheral line but a few days off shouldn't be too much of a problem for Awad if we supplement with dextrose PIV. Hopefully he will clear his cultures and we can get another central access placed for home infusion of his TPN and fentanyl. Waymond's dad expressed concern to me today that his son's mental status is still not very clear-that he is confused and "not making sense". 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 I question if there could be cerebral involvement of the infection or even his cancer, but will see how things go over the next few days with active treatment. Will maintain Klonopin for now BID. Appreciate ID and IM team in caring for [place] within the context of his goals of care-they have been hesitant to shift towards full comfort and hospice because Omarri's mother died 6 months ago with hospice care and they are actively grieving this loss.  Will follow closely.  Lane Hacker, DO Palliative Medicine

## 2013-11-09 NOTE — Progress Notes (Signed)
PT Cancellation Note  ___Treatment cancelled today due to medical issues with patient which prohibited therapy  ___ Treatment cancelled today due to patient receiving procedure or test   ___ Treatment cancelled today due to patient's refusal to participate   _X_ Treatment cancelled today due to family reports pt is not having a good day today.  Rica Koyanagi  PTA [facility]  Acute  Rehab Pager      [phone]

## 2013-11-09 NOTE — Progress Notes (Addendum)
INFECTIOUS DISEASE PROGRESS NOTE  ID: Brandon [name] is a 58 y.o. 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 Active Problems:   Abdominal pain, chronic, right upper quadrant   Functional quadriplegia   Protein-calorie malnutrition, severe   Anasarca   Cancer related pain   Rectal cancer metastasized to bone   Dehydration   Elevated LFTs   Anemia of chronic disease   Fever, unspecified   Bilateral hydronephrosis   Fungemia  Subjective: C/o polyuria, anasarca  Abtx:  Anti-infectives   Start     Dose/Rate Route Frequency Ordered Stop   11/07/13 1000  micafungin (MYCAMINE) 100 mg in sodium chloride 0.9 % 100 mL IVPB     100 mg 100 mL/hr over 1 Hours Intravenous Daily 11/07/13 0738        Medications:  Scheduled: . [START ON 11/10/2013] 0.9 % NaCl with KCl 40 mEq / L   Intravenous Q24H  . 0.9 % NaCl with KCl 40 mEq / L   Intravenous Q24H  . clonazePAM  1 mg Oral BID  . dexamethasone  4 mg Oral Q breakfast  . DOBELLS  15 mL Mouth/Throat BID  . enoxaparin (LOVENOX) injection  40 mg Subcutaneous Q24H  . furosemide  40 mg Intravenous BID  . insulin aspart  0-15 Units Subcutaneous Q6H  . micafungin (MYCAMINE) IV  100 mg Intravenous Daily  . pantoprazole  40 mg Oral BID  . tamsulosin  0.4 mg Oral QHS    Objective: Vital signs in last 24 hours: Temp:  [97.6 F (36.4 C)-97.8 F (36.6 C)] 97.8 F (36.6 C) (07/31 1421) Pulse Rate:  [94-98] 98 (07/31 1421) Resp:  [16-18] 16 (07/31 1421) BP: (106-114)/(70-74) 114/72 mmHg (07/31 1421) SpO2:  [98 %-100 %] 100 % (07/31 1421)   General appearance: alert, cooperative and no distress Resp: clear to auscultation bilaterally Chest wall: R upper chest dressing clean.  Cardio: regular rate and rhythm GI: normal findings: bowel sounds normal and soft, non-tender Extremities: edema anasarca  Lab Results  Recent Labs  11/07/13 0615 11/08/13 0522 11/09/13 0420  WBC 6.6  --  7.7  HGB 7.9*   --  7.8*  HCT 25.9*  --  24.9*  NA 145 142 136*  K 4.0 4.3 3.7  CL 112 110 105  CO2 22 18* 19  BUN 35* 30* 22  CREATININE 1.01 0.91 0.93   Liver Panel  Recent Labs  11/07/13 0615 11/08/13 0522  PROT 5.5* 5.4*  ALBUMIN 2.0* 2.0*  AST 53* 35  ALT 67* 62*  ALKPHOS 173* 216*  BILITOT 0.5 0.6   Sedimentation Rate No results found for this basename: ESRSEDRATE,  in the last 72 hours C-Reactive Protein No results found for this basename: CRP,  in the last 72 hours  Microbiology: Recent Results (from the past 240 hour(s))  MRSA PCR SCREENING     Status: None   Collection Time    11/05/13  4:21 PM      Result Value Ref Range Status   MRSA by PCR NEGATIVE  NEGATIVE Final   Comment:            The GeneXpert MRSA Assay (FDA     approved for NASAL specimens     only), is one component of a     comprehensive MRSA colonization     surveillance program. It is not     intended to diagnose MRSA     infection nor to guide  or     monitor treatment for     MRSA infections.  CULTURE, BLOOD (ROUTINE X 2)     Status: None   Collection Time    11/05/13  5:50 PM      Result Value Ref Range Status   Specimen Description BLOOD LEFT HAND   Final   Special Requests BOTTLES DRAWN AEROBIC AND ANAEROBIC 10CC   Final   Culture  Setup Time     Final   Value: 11/05/2013 22:08     Performed at [facility]   Culture     Final   Value: YEAST     Note: Gram Stain Report Called to,Read Back By and Verified With: Tempie Donning RN [phone]     Performed at [facility]   Report Status PENDING   Incomplete  CULTURE, BLOOD (ROUTINE X 2)     Status: None   Collection Time    11/05/13  6:00 PM      Result Value Ref Range Status   Specimen Description BLOOD LEFT ARM   Final   Special Requests BOTTLES DRAWN AEROBIC AND ANAEROBIC 10CC   Final   Culture  Setup Time     Final   Value: 11/05/2013 22:08     Performed at [facility]   Culture     Final   Value: YEAST     Note:  Gram Stain Report Called to,Read Back By and Verified With: Geanie Berlin 11/07/13 0648A [place]     Performed at [facility]   Report Status PENDING   Incomplete  CULTURE, BLOOD (ROUTINE X 2)     Status: None   Collection Time    11/07/13  5:30 PM      Result Value Ref Range Status   Specimen Description BLOOD LEFT HAND   Final   Special Requests BOTTLES DRAWN AEROBIC ONLY 10CC   Final   Culture  Setup Time     Final   Value: 11/08/2013 00:36     Performed at [facility]   Culture     Final   Value:        BLOOD CULTURE RECEIVED NO GROWTH TO DATE CULTURE WILL BE HELD FOR 5 DAYS BEFORE ISSUING A FINAL NEGATIVE REPORT     Performed at [facility]   Report Status PENDING   Incomplete  CATH TIP CULTURE     Status: None   Collection Time    11/08/13  2:05 PM      Result Value Ref Range Status   Specimen Description CATH TIP   Final   Special Requests Normal   Final   Culture     Final   Value: NO GROWTH 1 DAY     Performed at [facility]   Report Status PENDING   Incomplete    Studies/Results: Ir Removal Tun Cv Cath W/o Fl  11/08/2013   CLINICAL DATA:  Metastatic rectal carcinoma, fever, fungemia; request is made for tunneled right internal jugular catheter removal.  EXAM: REMOVAL TUNNELED CENTRAL VENOUS CATHETER  ANESTHESIA/SEDATION: None  MEDICATIONS: 1% lidocaine to the skin and subcutaneous tissues  CONTRAST:  None  PROCEDURE: Following written informed consent the patient's right upper hemothorax was prepped and draped in a sterile fashion and blunt dissection was performed to the skin cuff. The right internal jugular tunneled central venous catheter was removed and hemostasis with achieved with direct pressure. A Vaseline gauze dressing was applied to the site.  COMPLICATIONS: None immediate  FINDINGS: Clean intact tunneled right internal jugular central venous catheter. Length 26 cm.  IMPRESSION: Successful removal of a tunneled right internal jugular  tunneled central venous catheter. Catheter tip was submitted for culture.  Read by: Rowe Robert, PA-C   Electronically Signed   By: Sandi Mariscal M.D.   On: 11/08/2013 14:18     Assessment/Plan: Fungemia  Rectal Cancer, stage IV  Protein-albumin malnutrition anasarca  Total days of antibiotics: 3 (micafungin)  Would plan 2 weeks of micafungin from removal of port (end date 8-12) Place new port when his f/u BCx are (-), final will be in 2-3 more days.  Await speciation of yeast to determine if he can be changed to diflucan Dr Megan Salon is available if questions over weekend.          [name] Infectious Diseases (pager) [phone] [email or web address] 11/09/2013, 4:14 PM  LOS: 4 days   **Disclaimer: This note may have been dictated with voice recognition software. Similar sounding words can inadvertently be transcribed and this note may contain transcription errors which may not have been corrected upon publication of note.**

## 2013-11-09 NOTE — Consult Note (Addendum)
WOC ostomy follow up  Stoma type/location: LLQ loop ileo jejunostomy  Stomal assessment/size: 1 and 5/8 inches, stoma red and viable, above skin level. Stoma cap intact to right old stoma site;area not assessed at this time. Peristomal assessment: Pouching system which was previously applied has maintained seal for 45 hours, nursing staff  Is emptying Q 1 hour. Small amt liquid brown stool in pouch which was recently emptied. Peristomal skin intact; previously noted erythremia and maceration has resolved. Pouching System: Applied 1pc convex pouching system with two skin barrier rings and an ostomy appliance belt.  Caregiver is at bedside who is familiar with pouching routines and was present during last pouch application. Supplies in room for staff nurses if leakage occurs during the weekend.  Pt plans to have home health assistance after discharge and denies further questions at this time. Julien Girt MSN, RN, [place], [place], [place]

## 2013-11-09 NOTE — Progress Notes (Signed)
Father at bedside. Pt sleeping. He is ambulating to the bathroom with one assist. Refuses to eat.

## 2013-11-09 NOTE — Progress Notes (Signed)
Wasted 39ml fentany gtt down sink with [employer]

## 2013-11-10 LAB — GLUCOSE, CAPILLARY
GLUCOSE-CAPILLARY: 105 mg/dL — AB (ref 70–99)
Glucose-Capillary: 242 mg/dL — ABNORMAL HIGH (ref 70–99)

## 2013-11-10 LAB — CBC
HEMATOCRIT: 24.7 % — AB (ref 39.0–52.0)
HEMOGLOBIN: 8 g/dL — AB (ref 13.0–17.0)
MCH: 29.7 pg (ref 26.0–34.0)
MCHC: 32.4 g/dL (ref 30.0–36.0)
MCV: 91.8 fL (ref 78.0–100.0)
Platelets: 191 10*3/uL (ref 150–400)
RBC: 2.69 MIL/uL — ABNORMAL LOW (ref 4.22–5.81)
RDW: 16.8 % — AB (ref 11.5–15.5)
WBC: 11.4 10*3/uL — AB (ref 4.0–10.5)

## 2013-11-10 LAB — CULTURE, BLOOD (ROUTINE X 2)

## 2013-11-10 LAB — BASIC METABOLIC PANEL
ANION GAP: 14 (ref 5–15)
BUN: 17 mg/dL (ref 6–23)
CALCIUM: 8 mg/dL — AB (ref 8.4–10.5)
CO2: 19 mEq/L (ref 19–32)
CREATININE: 1.07 mg/dL (ref 0.50–1.35)
Chloride: 104 mEq/L (ref 96–112)
GFR calc Af Amer: 87 mL/min — ABNORMAL LOW (ref >90)
GFR calc non Af Amer: 75 mL/min — ABNORMAL LOW (ref >90)
GLUCOSE: 95 mg/dL (ref 70–99)
Potassium: 3.7 mEq/L (ref 3.7–5.3)
Sodium: 137 mEq/L (ref 137–147)

## 2013-11-10 MED ORDER — DEXTROSE 10 % IV SOLN: 2500.0000 mL | INTRAVENOUS | Status: DC

## 2013-11-10 MED ORDER — SODIUM CHLORIDE 0.9 % IJ SOLN
10.0000 mL | Freq: Two times a day (BID) | INTRAMUSCULAR | Status: DC
  Administered 2013-11-10 – 2013-11-11 (×3): 10 mL

## 2013-11-10 MED ORDER — FENTANYL CITRATE 0.05 MG/ML IJ SOLN
50.0000 ug | INTRAMUSCULAR | Status: DC | PRN
  Administered 2013-11-10 – 2013-11-11 (×2): 100 ug via INTRAVENOUS
  Administered 2013-11-12: 150 ug via INTRAVENOUS
  Administered 2013-11-12: 200 ug via INTRAVENOUS
  Administered 2013-11-12: 150 ug via INTRAVENOUS

## 2013-11-10 MED ORDER — FENTANYL BOLUS VIA INFUSION
100.0000 ug | INTRAVENOUS | Status: DC | PRN
  Filled 2013-11-10: qty 100

## 2013-11-10 MED ORDER — SODIUM CHLORIDE 0.9 % IJ SOLN: 10.0000 mL | INTRAMUSCULAR | Status: DC | PRN

## 2013-11-10 MED ORDER — DIAZEPAM 5 MG/ML IJ SOLN
5.0000 mg | Freq: Two times a day (BID) | INTRAMUSCULAR | Status: DC
  Administered 2013-11-10 – 2013-11-11 (×2): 5 mg via INTRAVENOUS
  Filled 2013-11-10: qty 2

## 2013-11-10 NOTE — Progress Notes (Signed)
Peripherally Inserted Central Catheter/Midline Placement  The IV Nurse has discussed with the patient and/or persons authorized to consent for the patient, the purpose of this procedure and the potential benefits and risks involved with this procedure.  The benefits include less needle sticks, lab draws from the catheter and patient may be discharged home with the catheter.  Risks include, but not limited to, infection, bleeding, blood clot (thrombus formation), and puncture of an artery; nerve damage and irregular heat beat.  Alternatives to this procedure were also discussed. Family at bedside.  Pt agrees to PICC placement, father signed consent for pt.  PICC/Midline Placement Documentation  PICC / Midline Double Lumen  Right (Active)     PICC / Midline Double Lumen 11/10/13 PICC Right Brachial 41 cm 0 cm (Active)  Indication for Insertion or Continuance of Line Limited venous access - need for IV therapy >5 days (PICC only);Poor Vasculature-patient has had multiple peripheral attempts or PIVs lasting less than 24 hours 11/10/2013  5:27 PM  Exposed Catheter (cm) 0 cm 11/10/2013  5:27 PM  Site Assessment Clean;Dry;Intact 11/10/2013  5:27 PM  Lumen #1 Status Flushed;Saline locked;Blood return noted 11/10/2013  5:27 PM  Lumen #2 Status Flushed;Saline locked;Blood return noted 11/10/2013  5:27 PM  Dressing Type Transparent 11/10/2013  5:27 PM  Dressing Status Clean;Dry;Intact;Antimicrobial disc in place 11/10/2013  5:27 PM  Line Care Connections checked and tightened 11/10/2013  5:27 PM  Line Adjustment (NICU/IV Team Only) No 11/10/2013  5:27 PM  Dressing Intervention New dressing 11/10/2013  5:27 PM  Dressing Change Due 11/17/13 11/10/2013  5:27 PM       Brandon [name] 11/10/2013, 5:27 PM

## 2013-11-10 NOTE — Progress Notes (Signed)
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 Codeine Nausea And Vomiting  . Effexor [Venlafaxine Hcl] Other (See Comments)    Flat Feeling  . Imuran [Azathioprine] Nausea And Vomiting    Patient Measurements: Height: 5\' 7"  (170.2 cm) Weight: 161 lb 12.8 oz (73.392 kg) IBW/kg (Calculated) : 66.1  Vital Signs: Temp: 97.7 F (36.5 C) (08/01 0736) Temp src: Oral (08/01 0736) BP: 109/58 mmHg (08/01 0736) Pulse Rate: 101 (08/01 0736) Intake/Output from previous day: 07/31 0701 - 08/01 0700 In: 3463.8 [P.O.:120; I.V.:3193.8; IV Piggyback:150] Out: 1245 [Urine:3515; Drains:300; Stool:800] Intake/Output from this shift:    Labs:  Recent Labs  11/09/13 0420 11/10/13 0634  WBC 7.7 11.4*  HGB 7.8* 8.0*  HCT 24.9* 24.7*  PLT 142* 191     Recent Labs  11/08/13 0522 11/09/13 0420 11/10/13 0634  NA 142 136* 137  K 4.3 3.7 3.7  CL 110 105 104  CO2 18* 19 19  GLUCOSE 170* 217* 95  BUN 30* 22 17  CREATININE 0.91 0.93 1.07  CALCIUM 8.2* 7.5* 8.0*  MG 2.4  --   --   PHOS 2.2*  --   --   PROT 5.4*  --   --   ALBUMIN 2.0*  --   --   AST 35  --   --   ALT 62*  --   --   ALKPHOS 216*  --   --   BILITOT 0.6  --   --    Estimated Creatinine Clearance: 70.4 ml/min (by C-G formula based on Cr of 1.07).    Recent Labs  11/09/13 1427 11/09/13 2013 11/10/13 0229  GLUCAP 112* 187* 242*    Medical History: Past Medical History  Diagnosis Date  . Biliary dyskinesia   . Bowel obstruction   . Ulcerative colitis   . Allergy   . Depression   . Ileostomy in place   . ED (erectile dysfunction)   . Benign positional vertigo   . Prolactinoma   . Hypogonadism male   . History of blood transfusion   .  Pituitary tumor 2009  . 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 dextrose 2,500 mL (11/10/13 0455)  . fentaNYL infusion INTRAVENOUS 250 mcg/hr (11/10/13 0436)   PRN: acetaminophen, acetaminophen, diazepam, fentaNYL, haloperidol lactate, promethazine  Insulin Requirements in the past 24 hours:  8 units while off TPN on D10%.  Current Nutrition:  Long term cyclic TPN as outpatient prior to admission: 12 hr nightly admin of TPN without lipids 6x/week provides 96g protein daily and 1658 kcal/day and TPN with lipids also added only Fridays provides 96g protein and 2658 kcal. 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 Palliative managing patient and patient currently on fentanyl drip. TNA was held last night but per Md to restart TNA tonight and use patient's home supply per Md order.   7/30: PAC removed for fungemia, TPN held and Y09 cyclic infusion started  8/1: TNA remains on hold until central access can be  replaced - possibly in 2-3 days if repeat blood cultures remain negative.  CBGs: off TPN now - 112-242. Lytes: wnl except phos trended down. CorrCa WNL.   Liver: LFTs and AlkP cont to imrove (7/30). Tbili WNL stable Renal: stable Prealbumin: 9.4 (7/29) TGs: 236 (7/29)  Nutritional Goals:  Per RD assessment: Kcal 2000-2200, protein 100-110 g, >= 2000 mL/day  Plan:   As stated above, once TNA re-initiated, will use patient's own supply of TNA for now unless labs dictate to change to our supply. Patient's father brought in TNA and additives of MVI 77mL and octreotide 527mcg that pharmacy will add to bag. Will discuss with patient need for further TNA bags as needed and will likely have [facility] provide bags for Saturday and Sunday if patient is  still admitted. Per [facility], current supply that patient has will expire on Saturday. Not adding octreotide to TPN per Dr. Hilma Favors.  Cont holding TPN until central access is re-established.  D10W infused over night as same as TPN:  A. 6p-7p: 100 ml/hr   B. 7p-5a: 230 ml/hr   C. 5a-6a: 100 ml/hr  Continue Mod SSI q6h.  Mon/Thurs TNA labs.  Ralene Bathe, PharmD, BCPS 11/10/2013, 7:45 AM  Pager: [phone]

## 2013-11-10 NOTE — Progress Notes (Signed)
Progress Note   Brandon [name] JGG:[id number] DOB: Dec 20, 1955 DOA: 11/05/2013 PCP: [name], MD   Brief Narrative:   Brandon [name] is an 58 y.o. male with a PMH of metastatic rectal cancer treated with 14 cycles of 5-FU, currently under the care of Dr. Marcell Anger (oncology) at [facility] [phone], recent hospitalization for GI bleed, performance status of 3, last seen by his oncologist on 10/24/13 with recommendations for symptom management given his overall frail state. His oncologist attempted to discuss his CODE STATUS with him at that visit, however he did not want to engage in a conversation about this at that time. He has had problems with symptom management including issues related to nausea, anxiety, and pain. He was referred as a direct admission by his palliative care doctor, Dr. Hilma Favors, for symptom management and concerns for opiate toxicity.   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   Dilaudid-HP drip discontinued, now on fentanyl for pain control.  Symptoms markedly improved, only occasional myoclonus, more awake and alert.  Active Problems:  Fever / fungemia  Blood cultures sent 11/05/13. One of 2 cultures growing yeast. Continue micafungin.  ID consultation requested, seen by Dr. Johnnye Sima who strongly recommends removing his CVC, which was removed 11/08/13.  Cultures of the CVC tip negative to date. Followup surveillance blood cultures sent 11/07/13, also negative to date.  Anasarca   Start Lasix 40 mg twice a day. Also received albumin.  Abdominal pain, chronic, right upper quadrant / cancer related pain   Pain management per palliative care team.   Functional quadriplegia   Obtain PT evaluation.  Protein-calorie malnutrition, severe   TNA on hold until central venous access reestablished.  If surveillance blood  cultures remain negative by 11/12/13, will replace central line.  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 Palliative care following.  Dehydration / acute kidney injury / bilateral hydronephrosis  Hydrating with normal saline.  Monitor renal function periodically.  Elevated LFTs   Likely related to liver and bone metastasis.   Anemia of chronic disease   No current indication for transfusion, hemoglobin stable.  DVT prophylaxis   Lovenox ordered.  Code Status: DNR  Family Communication: Father at bedside.  Disposition Plan: Home versus residential hospice placement.    IV Access:    Double lumen CVC right subclavian removed 11/08/13.  Peripheral IV   Procedures and diagnostic studies:    11/06/13: Chest x-ray: Bibasilar atelectasis. No acute infiltrate.  11/08/13: Tunneled right IJ catheter removed, catheter tip submitted for culture.   Medical Consultants:    Dr. Isaac Laud, Palliative Care  Dr. [name], ID  Interventional radiology   Other Consultants:    Physical therapy   Anti-Infectives:    Micafungin 11/07/13--->   Subjective:   Brandon [name] has had no further fever or rigors since his CVC was removed. He has been able to urinate okay and has had increased frequency of urination with initiation of Lasix. Appetite poor. Lethargic.  Objective:    Filed Vitals:   11/09/13 0558 11/09/13 1421 11/09/13 2127 11/10/13 0736  BP: 106/74 114/72 128/81 109/58  Pulse: 94 98 94 101  Temp: 97.7 F (36.5 C) 97.8 F (36.6 C) 98.2 F (36.8 C) 97.7 F (36.5 C)  TempSrc: Oral Oral Oral Oral  Resp: 16 16 16 16   Height:  Weight:      SpO2: 98% 100% 100% 100%    Intake/Output Summary (Last 24 hours) at 11/10/13 0753 Last data filed at 11/10/13 0455  Gross per 24 hour  Intake 3463.75 ml  Output   4615 ml  Net -1151.25 ml    Exam: Gen:  Mildly lethargic  Cardiovascular:  Tachycardic, No M/R/G Respiratory:   Lungs CTAB Gastrointestinal:  Abdomen soft, NT/ND, + BS, +ostomy/PEG Extremities:  3+ lower extremity edema extending to scrotum with scrotal edema slightly reduced   Data Reviewed:    Labs: Basic Metabolic Panel:  Recent Labs Lab 11/05/13 1350 11/06/13 1005 11/07/13 0615 11/08/13 0522 11/09/13 0420 11/10/13 0634  NA 148* 147 145 142 136* 137  K 4.1 3.5* 4.0 4.3 3.7 3.7  CL 108 112 112 110 105 104  CO2 24 22 22  18* 19 19  GLUCOSE 138* 126* 125* 170* 217* 95  BUN 30* 24* 35* 30* 22 17  CREATININE 1.15 1.16 1.01 0.91 0.93 1.07  CALCIUM 9.8 7.7* 8.2* 8.2* 7.5* 8.0*  MG 2.0  --  2.5 2.4  --   --   PHOS 4.3  --  2.3 2.2*  --   --    GFR Estimated Creatinine Clearance: 70.4 ml/min (by C-G formula based on Cr of 1.07). Liver Function Tests:  Recent Labs Lab 11/05/13 1350 11/07/13 0615 11/08/13 0522  AST 49* 53* 35  ALT 63* 67* 62*  ALKPHOS 270* 173* 216*  BILITOT 0.6 0.5 0.6  PROT 6.6 5.5* 5.4*  ALBUMIN 2.4* 2.0* 2.0*   CBC:  Recent Labs Lab 11/05/13 1350 11/07/13 0615 11/09/13 0420 11/10/13 0634  WBC 6.2 6.6 7.7 11.4*  NEUTROABS 5.4 5.7  --   --   HGB 9.7* 7.9* 7.8* 8.0*  HCT 31.5* 25.9* 24.9* 24.7*  MCV 97.2 97.7 93.6 91.8  PLT 235 165 142* 191   Microbiology Recent Results (from the past 240 hour(s))  MRSA PCR SCREENING     Status: None   Collection Time    11/05/13  4:21 PM      Result Value Ref Range Status   MRSA by PCR NEGATIVE  NEGATIVE Final   Comment:            The GeneXpert MRSA Assay (FDA     approved for NASAL specimens     only), is one component of a     comprehensive MRSA colonization     surveillance program. It is not     intended to diagnose MRSA     infection nor to guide or     monitor treatment for     MRSA infections.  CULTURE, BLOOD (ROUTINE X 2)     Status: None   Collection Time    11/05/13  5:50 PM      Result Value Ref Range Status   Specimen Description BLOOD LEFT HAND   Final   Special Requests BOTTLES DRAWN  AEROBIC AND ANAEROBIC 10CC   Final   Culture  Setup Time     Final   Value: 11/05/2013 22:08     Performed at [facility]   Culture     Final   Value: YEAST     Note: Gram Stain Report Called to,Read Back By and Verified With: Tempie Donning RN [phone]     Performed at [facility]   Report Status PENDING   Incomplete  CULTURE, BLOOD (ROUTINE X 2)     Status: None   Collection Time  11/05/13  6:00 PM      Result Value Ref Range Status   Specimen Description BLOOD LEFT ARM   Final   Special Requests BOTTLES DRAWN AEROBIC AND ANAEROBIC 10CC   Final   Culture  Setup Time     Final   Value: 11/05/2013 22:08     Performed at [facility]   Culture     Final   Value: YEAST     Note: Gram Stain Report Called to,Read Back By and Verified With: Geanie Berlin 11/07/13 0648A [place]     Performed at [facility]   Report Status PENDING   Incomplete  CULTURE, BLOOD (ROUTINE X 2)     Status: None   Collection Time    11/07/13  5:30 PM      Result Value Ref Range Status   Specimen Description BLOOD LEFT HAND   Final   Special Requests BOTTLES DRAWN AEROBIC ONLY 10CC   Final   Culture  Setup Time     Final   Value: 11/08/2013 00:36     Performed at [facility]   Culture     Final   Value:        BLOOD CULTURE RECEIVED NO GROWTH TO DATE CULTURE WILL BE HELD FOR 5 DAYS BEFORE ISSUING A FINAL NEGATIVE REPORT     Performed at [facility]   Report Status PENDING   Incomplete  CATH TIP CULTURE     Status: None   Collection Time    11/08/13  2:05 PM      Result Value Ref Range Status   Specimen Description CATH TIP   Final   Special Requests Normal   Final   Culture     Final   Value: NO GROWTH 1 DAY     Performed at [facility]   Report Status PENDING   Incomplete     Medications:   . 0.9 % NaCl with KCl 40 mEq / L   Intravenous Q24H  . 0.9 % NaCl with KCl 40 mEq / L   Intravenous Q24H  . albumin human  12.5 g Intravenous Q6H    . clonazePAM  1 mg Oral BID  . dexamethasone  4 mg Oral Q breakfast  . DOBELLS  15 mL Mouth/Throat BID  . enoxaparin (LOVENOX) injection  40 mg Subcutaneous Q24H  . furosemide  40 mg Intravenous BID  . Gerhardt's butt cream   Topical BID  . insulin aspart  0-15 Units Subcutaneous Q6H  . micafungin (MYCAMINE) IV  100 mg Intravenous Daily  . pantoprazole  40 mg Oral BID  . tamsulosin  0.4 mg Oral QHS   Continuous Infusions: . dextrose Stopped (11/10/13 0542)  . dextrose    . fentaNYL infusion INTRAVENOUS 250 mcg/hr (11/10/13 0436)    Time spent: 25 minutes.    LOS: 5 days   [facility] Pager [phone]. If unable to reach me by pager, please call my cell phone at [phone].  *Please refer to [email or web address], password [email or web address] to get updated schedule on who will round on this patient, as hospitalists switch teams weekly. If 7PM-7AM, please contact night-coverage at [email or web address], password [email or web address] for any overnight needs.  11/10/2013, 7:53 AM    **Disclaimer: This note was dictated with voice recognition software. Similar sounding words can inadvertently be transcribed and this note may contain transcription errors which may not have been corrected upon publication of note.**

## 2013-11-10 NOTE — Progress Notes (Cosign Needed)
Wasted 15mL fentanyl gtt down sink, witnessed by [employer]

## 2013-11-10 NOTE — Progress Notes (Signed)
Palliative Medicine Team Progress Note  I met with Mr. Marcott, [place] father and his Aunt who are his primary decision makers to re-address goals of care. Mr. Salay feels that he has finally reconciled in his heart and mind that Hershey is at a place where he will not recover his functional status and QOL- he tells me that "this is the point at which I would be taking my puppy to the vet"-- He says he can no longer watch Shanon Brow suffer and that Rashod has been saying he thinks that this is the end and is also ready. Kaevion's mental status has also deteriorated significantly. Mr. Lucila Maine admits that he needed the last month to be certain that Hamilton's condition was cancer progression and irreversible.  I described what a transition to Full comfort care would look like including stopping TPN, stopping antibiotics and only using comfort meds and close attention to personal comfort needs moving forward. I prepared them for a variety of disease trajectories-including sudden death or a prolonged dying process. I suspect this will progress quickly related to stopping TPN and hypoglycemia/electrolyte related abnormalities.  May need to place a PICC for secure comfort med dosing and continuous infusion. Stop all labs.   I anticipate a hospital death but I have prepared family that we may need to look at hospice facility options- they would clearly prefer a hospital death.  Given Jahaad's extensive cancer, limited functional status and high level of suffering and symptom burden I support and will initiate full comfort measures as requested by his father.  Lane Hacker, DO Palliative Medicine [phone]

## 2013-11-11 DIAGNOSIS — Z66 Do not resuscitate: Secondary | ICD-10-CM

## 2013-11-11 DIAGNOSIS — N1330 Unspecified hydronephrosis: Secondary | ICD-10-CM

## 2013-11-11 DIAGNOSIS — K56609 Unspecified intestinal obstruction, unspecified as to partial versus complete obstruction: Secondary | ICD-10-CM

## 2013-11-11 MED ORDER — DIAZEPAM 5 MG/ML IJ SOLN
5.0000 mg | Freq: Once | INTRAMUSCULAR | Status: AC
  Administered 2013-11-11: 5 mg via INTRAVENOUS

## 2013-11-11 MED ORDER — PROMETHAZINE HCL 25 MG/ML IJ SOLN
25.0000 mg | Freq: Once | INTRAMUSCULAR | Status: AC
  Administered 2013-11-11: 25 mg via INTRAVENOUS

## 2013-11-11 MED ORDER — DIAZEPAM 5 MG/ML IJ SOLN
5.0000 mg | Freq: Three times a day (TID) | INTRAMUSCULAR | Status: DC
  Administered 2013-11-11 – 2013-11-12 (×3): 5 mg via INTRAVENOUS
  Filled 2013-11-11 (×3): qty 2

## 2013-11-11 MED ORDER — HALOPERIDOL LACTATE 5 MG/ML IJ SOLN: 2.0000 mg | Freq: Four times a day (QID) | INTRAMUSCULAR | Status: DC | PRN

## 2013-11-11 MED ORDER — DEXAMETHASONE 2 MG PO TABS
2.0000 mg | ORAL_TABLET | Freq: Every day | ORAL | Status: DC
  Filled 2013-11-11 (×2): qty 1

## 2013-11-11 MED ORDER — PROMETHAZINE HCL 25 MG/ML IJ SOLN
25.0000 mg | INTRAMUSCULAR | Status: DC | PRN
  Administered 2013-11-12: 25 mg via INTRAVENOUS
  Filled 2013-11-11 (×2): qty 1

## 2013-11-11 MED ORDER — PROMETHAZINE HCL 25 MG/ML IJ SOLN
25.0000 mg | Freq: Two times a day (BID) | INTRAMUSCULAR | Status: DC
  Administered 2013-11-11 – 2013-11-12 (×2): 25 mg via INTRAVENOUS
  Filled 2013-11-11 (×3): qty 1

## 2013-11-11 NOTE — Progress Notes (Addendum)
Palliative Medicine Team Progress Note  Brandon [name] is having worsening nausea, he is keeping a bucket in the bed with him. He continues to be very confused and is much more irritable and paranoid that he has been previously. Goals are comfort care now and TPN has been discontinued. I have increased his Valium and scheduled q12 IV doses of phenergan for his nausea. I had extensive conversation with Brandon [name] and his Dad about hospice care and next steps.  Brandon [name] really wants to "go home" but his care is beyond what his father can provide for him and has been an emotional and physical strain on his elderly father-his father is actively grieving the loss of his wife-Brandon [name]'s mother who died less than 6 months ago- he is extremely tearful recalling how his wife neglected her own cancer treatments so that Brandon [name] could get treatment and how difficult it was when Hospice came in for his wife- he says when Hospice came in his wife gave up and just died- he just doesn't feel good about his prior hospice experience unfortunately.  Brandon [name] is irritable today and tells me he doesn't trust me-he thinks the hospital staff are against him and trying ti hurt him-he however does agree to go to a hospice facility for his care if he cant go home-he tells me he wants to go "now". Brandon [name] father -although not explicitly stated-really desires a hospital death for his son- which is unfortunate since Hospice could really support him in his complex grief.   Brandon [name]'s prognosis is difficult, but I suspect less than 2 weeks. He requires a high dose fentanyl infusion and has needed Valium for agitation and phenergan for nausea. If his paranoia continues I may schedule Haldol instead of phenergan for both the psychosis and the nausea.  Comfort remain the goals- Brandon [name], Brandon [name] father is struggling and suffering over making decisions about where Brandon [name] will receive EOL care. Brandon [name] is encephalopathic and while he understands some pieces of his plan and  that he is dying he cannot really participate in realistic and rational decision making.  Plan for tomorrow is to explore [facility] as an option for Brandon [name] care-Brandon [name] wants to go see the facility-will initiate that referral. Brandon [name] may also decline quickly and just have a hospital death and I have prepared them for that possibility.  Brandon Hacker, DO Palliative Medicine  Time: 230-3:30PM 60 minutes at bedside and in discussion about his care. Greater than 50%  of this time was spent counseling and coordinating care related to the above assessment and plan.

## 2013-11-11 NOTE — Progress Notes (Signed)
Pt restless at times and sleeping other times. When awake he is confused. He requests to sit in the bathroom, pt has colostomy and foley. Father states he finds peace when he's in the bathroom. Pt ambulated to the bathroom where he sat for a while with father present,

## 2013-11-11 NOTE — Progress Notes (Signed)
Progress Note   KENTREL CLEVENGER UUV:[id number] DOB: [birth year] DOA: 11/05/2013 PCP: [name], MD   Brief Narrative:   Brandon [name] is an 58 y.o. male with a PMH of metastatic rectal cancer treated with 14 cycles of 5-FU, currently under the care of Dr. Marcell Anger (oncology) at [facility] [phone], recent hospitalization for GI bleed, performance status of 3, last seen by his oncologist on 10/24/13 with recommendations for symptom management given his overall frail state. His oncologist attempted to discuss his CODE STATUS with him at that visit, however he did not want to engage in a conversation about this at that time. He has had problems with symptom management including issues related to nausea, anxiety, and pain. He was referred as a direct admission by his palliative care doctor, Dr. Hilma Favors, for symptom management and concerns for opiate toxicity. At this point, the patient has opted for full comfort measures.  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   Dilaudid-HP drip discontinued, now on fentanyl for pain control.  Pain control achieved with minimal side effects.  Active Problems:  Fever / fungemia  Blood cultures sent 11/05/13. One of 2 cultures growing yeast. Treated with micafungin.  ID consultation requested, seen by Dr. Johnnye Sima who strongly recommended removing his CVC, which was removed 11/08/13.  Cultures of the CVC tip negative to date. Followup surveillance blood cultures sent 11/07/13, also negative to date.  Micafungin discontinued 11/10/13 in light of transition to full comfort measures.  Anasarca   Continue Lasix 40 mg twice a day. Also received albumin.  Abdominal pain, chronic, right upper quadrant / cancer related pain   Pain management per palliative care team.   Functional quadriplegia   Full comfort measures  at this point.  Protein-calorie malnutrition, severe   No further TNA. Comfort feeds only.  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 Palliative care following.  Dehydration / acute kidney injury / bilateral hydronephrosis  Hydrated with normal saline.  No further lab draws in light of comfort care. IV fluids discontinued.  Elevated LFTs   Likely related to liver and bone metastasis.   Anemia of chronic disease   No further monitoring in light of comfort measures.  DVT prophylaxis   Lovenox ordered.  Code Status: DNR  Family Communication: Father at bedside.  Disposition Plan: Residential hospice placement versus in-hospital death.    IV Access:    Double lumen CVC right subclavian removed 11/08/13.  PICC placed 11/10/13.   Procedures and diagnostic studies:    11/06/13: Chest x-ray: Bibasilar atelectasis. No acute infiltrate.  11/08/13: Tunneled right IJ catheter removed, catheter tip submitted for culture.  11/10/13: PICC placed.   Medical Consultants:    Dr. Isaac Laud & Dr. Rhea Pink, Palliative Care  Dr. [name], ID  Interventional radiology   Other Consultants:    Physical therapy   Anti-Infectives:    Micafungin 11/07/13---> 11/10/13   Subjective:   Brandon [name] has had no further fever or rigors since his CVC was removed. Lethargic.  Ate a few bites of peaches this morning.  Seems comfortable.  Objective:    Filed Vitals:   11/10/13 1300 11/10/13 1357 11/10/13 2040 11/11/13 0624  BP: 126/76 122/71  115/68  Pulse: 92 94 105 100  Temp: 97.9 F (36.6 C) 97.8 F (36.6 C)  98.6 F (37 C)  TempSrc:  Oral Oral  Oral  Resp: 16   16  Height:      Weight:      SpO2: 99% 100% 99% 100%    Intake/Output Summary (Last 24 hours) at 11/11/13 0743 Last data filed at 11/11/13 6384  Gross per 24 hour  Intake     50 ml  Output   6650 ml  Net  -6600 ml    Exam: Gen:  Mildly lethargic  Cardiovascular:   Tachycardic, No M/R/G Respiratory:  Lungs CTAB Gastrointestinal:  Abdomen soft, NT/ND, + BS, +ostomy/PEG Extremities:  3+ lower extremity edema    Data Reviewed:    Labs: Basic Metabolic Panel:  Recent Labs Lab 11/05/13 1350 11/06/13 1005 11/07/13 0615 11/08/13 0522 11/09/13 0420 11/10/13 0634  NA 148* 147 145 142 136* 137  K 4.1 3.5* 4.0 4.3 3.7 3.7  CL 108 112 112 110 105 104  CO2 24 22 22  18* 19 19  GLUCOSE 138* 126* 125* 170* 217* 95  BUN 30* 24* 35* 30* 22 17  CREATININE 1.15 1.16 1.01 0.91 0.93 1.07  CALCIUM 9.8 7.7* 8.2* 8.2* 7.5* 8.0*  MG 2.0  --  2.5 2.4  --   --   PHOS 4.3  --  2.3 2.2*  --   --    GFR Estimated Creatinine Clearance: 70.4 ml/min (by C-G formula based on Cr of 1.07). Liver Function Tests:  Recent Labs Lab 11/05/13 1350 11/07/13 0615 11/08/13 0522  AST 49* 53* 35  ALT 63* 67* 62*  ALKPHOS 270* 173* 216*  BILITOT 0.6 0.5 0.6  PROT 6.6 5.5* 5.4*  ALBUMIN 2.4* 2.0* 2.0*   CBC:  Recent Labs Lab 11/05/13 1350 11/07/13 0615 11/09/13 0420 11/10/13 0634  WBC 6.2 6.6 7.7 11.4*  NEUTROABS 5.4 5.7  --   --   HGB 9.7* 7.9* 7.8* 8.0*  HCT 31.5* 25.9* 24.9* 24.7*  MCV 97.2 97.7 93.6 91.8  PLT 235 165 142* 191   Microbiology Recent Results (from the past 240 hour(s))  MRSA PCR SCREENING     Status: None   Collection Time    11/05/13  4:21 PM      Result Value Ref Range Status   MRSA by PCR NEGATIVE  NEGATIVE Final   Comment:            The GeneXpert MRSA Assay (FDA     approved for NASAL specimens     only), is one component of a     comprehensive MRSA colonization     surveillance program. It is not     intended to diagnose MRSA     infection nor to guide or     monitor treatment for     MRSA infections.  CULTURE, BLOOD (ROUTINE X 2)     Status: None   Collection Time    11/05/13  5:50 PM      Result Value Ref Range Status   Specimen Description BLOOD LEFT HAND   Final   Special Requests BOTTLES DRAWN AEROBIC AND ANAEROBIC  10CC   Final   Culture  Setup Time     Final   Value: 11/05/2013 22:08     Performed at [facility]   Culture     Final   Value: CANDIDA GLABRATA     Note: Gram Stain Report Called to,Read Back By and Verified With: Tempie Donning RN [phone]     Performed at [facility]   Report Status 11/10/2013 FINAL  Final  CULTURE, BLOOD (ROUTINE X 2)     Status: None   Collection Time    11/05/13  6:00 PM      Result Value Ref Range Status   Specimen Description BLOOD LEFT ARM   Final   Special Requests BOTTLES DRAWN AEROBIC AND ANAEROBIC 10CC   Final   Culture  Setup Time     Final   Value: 11/05/2013 22:08     Performed at [facility]   Culture     Final   Value: CANDIDA GLABRATA     Note: Gram Stain Report Called to,Read Back By and Verified With: Geanie Berlin 11/07/13 0648A [place]     Performed at [facility]   Report Status 11/10/2013 FINAL   Final  CULTURE, BLOOD (ROUTINE X 2)     Status: None   Collection Time    11/07/13  5:30 PM      Result Value Ref Range Status   Specimen Description BLOOD LEFT HAND   Final   Special Requests BOTTLES DRAWN AEROBIC ONLY 10CC   Final   Culture  Setup Time     Final   Value: 11/08/2013 00:36     Performed at [facility]   Culture     Final   Value:        BLOOD CULTURE RECEIVED NO GROWTH TO DATE CULTURE WILL BE HELD FOR 5 DAYS BEFORE ISSUING A FINAL NEGATIVE REPORT     Performed at [facility]   Report Status PENDING   Incomplete  CATH TIP CULTURE     Status: None   Collection Time    11/08/13  2:05 PM      Result Value Ref Range Status   Specimen Description CATH TIP   Final   Special Requests Normal   Final   Culture     Final   Value: Culture reincubated for better growth     Performed at [facility]   Report Status PENDING   Incomplete     Medications:   . clonazePAM  1 mg Oral BID  . dexamethasone  4 mg Oral Q breakfast  . diazepam  5 mg Intravenous q12n4p  . DOBELLS   15 mL Mouth/Throat BID  . furosemide  40 mg Intravenous BID  . Gerhardt's butt cream   Topical BID  . pantoprazole  40 mg Oral BID  . sodium chloride  10-40 mL Intracatheter Q12H  . tamsulosin  0.4 mg Oral QHS   Continuous Infusions: . fentaNYL infusion INTRAVENOUS 250 mcg/hr (11/10/13 1609)    Time spent: 25 minutes.    LOS: 6 days   [facility] Pager [phone]. If unable to reach me by pager, please call my cell phone at [phone].  *Please refer to [email or web address], password [email or web address] to get updated schedule on who will round on this patient, as hospitalists switch teams weekly. If 7PM-7AM, please contact night-coverage at [email or web address], password [email or web address] for any overnight needs.  11/11/2013, 7:43 AM    **Disclaimer: This note was dictated with voice recognition software. Similar sounding words can inadvertently be transcribed and this note may contain transcription errors which may not have been corrected upon publication of note.**

## 2013-11-12 DIAGNOSIS — C801 Malignant (primary) neoplasm, unspecified: Secondary | ICD-10-CM | POA: Diagnosis not present

## 2013-11-12 DIAGNOSIS — F22 Delusional disorders: Secondary | ICD-10-CM | POA: Diagnosis present

## 2013-11-12 DIAGNOSIS — C20 Malignant neoplasm of rectum: Secondary | ICD-10-CM | POA: Diagnosis not present

## 2013-11-12 DIAGNOSIS — Z515 Encounter for palliative care: Secondary | ICD-10-CM | POA: Diagnosis not present

## 2013-11-12 MED ORDER — PROMETHAZINE HCL 25 MG/ML IJ SOLN: 25.0000 mg | INTRAMUSCULAR | Status: AC | PRN

## 2013-11-12 MED ORDER — KETOROLAC TROMETHAMINE 30 MG/ML IJ SOLN
30.0000 mg | Freq: Four times a day (QID) | INTRAMUSCULAR | Status: DC | PRN
  Filled 2013-11-12: qty 1

## 2013-11-12 MED ORDER — SODIUM CHLORIDE 0.9 % IV SOLN: INTRAVENOUS | Status: AC

## 2013-11-12 MED ORDER — DIAZEPAM 5 MG/ML IJ SOLN: 10.0000 mg | INTRAMUSCULAR | Status: AC | PRN

## 2013-11-12 MED ORDER — DIAZEPAM 5 MG/ML IJ SOLN: 10.0000 mg | INTRAMUSCULAR | Status: DC | PRN

## 2013-11-12 MED ORDER — HALOPERIDOL LACTATE 5 MG/ML IJ SOLN: 2.0000 mg | Freq: Four times a day (QID) | INTRAMUSCULAR | Status: AC | PRN

## 2013-11-12 MED ORDER — PROMETHAZINE HCL 25 MG/ML IJ SOLN: 25.0000 mg | Freq: Two times a day (BID) | INTRAMUSCULAR | Status: AC

## 2013-11-12 MED ORDER — DEXAMETHASONE SODIUM PHOSPHATE 4 MG/ML IJ SOLN
2.0000 mg | Freq: Every day | INTRAMUSCULAR | Status: DC
  Administered 2013-11-12: 2 mg via INTRAVENOUS
  Filled 2013-11-12: qty 0.5

## 2013-11-12 MED ORDER — DEXAMETHASONE SODIUM PHOSPHATE 4 MG/ML IJ SOLN: 2.0000 mg | Freq: Every day | INTRAMUSCULAR | Status: AC

## 2013-11-12 MED ORDER — PANTOPRAZOLE SODIUM 40 MG IV SOLR
40.0000 mg | Freq: Two times a day (BID) | INTRAVENOUS | Status: DC
  Administered 2013-11-12: 40 mg via INTRAVENOUS
  Filled 2013-11-12 (×2): qty 40

## 2013-11-12 MED ORDER — FENTANYL CITRATE 0.05 MG/ML IJ SOLN
200.0000 ug | INTRAMUSCULAR | Status: DC | PRN
  Administered 2013-11-12: 200 ug via INTRAVENOUS

## 2013-11-12 NOTE — Progress Notes (Signed)
CSW reviewed chart this morning and noted that per PMT MD, pt and pt family agreeable to full comfort care and transition to [facility] [place].   CSW spoke with [facility] liaison this morning to reassess pt for residential hospice placement.   CSW received phone call from [place] [facility] liaison stating that she was present at hospital and meeting with pt and pt family.  CSW later received phone call from [facility] liaison, Orbie Pyo that she had met with pt and pt family and all in agreement to transition to [facility] and bed available today.  CSW met with pt father outside of pt room. CSW provided emotional support and discussed process of transitioning to [place] today. Pt father stated that he plans to go to facility to complete paperwork and is agreeable to transition once family arrive to facility.   CSW to await notification from [place] [facility] [place] to arrange ambulance transport.  CSW to continue to follow.  Alison Murray, MSW, [place] [facility] [phone]

## 2013-11-12 NOTE — Progress Notes (Signed)
Nutrition Brief Note   Chart reviewed. Pt now transitioning to comfort care.  TPN has been discontinued. No further nutrition interventions warranted at this time.  Please re-consult as needed.   Atlee Abide MS RD LDN Clinical Dietitian [phone]

## 2013-11-12 NOTE — Progress Notes (Signed)
Pt for discharge to [facility] [place].  CSW facilitated pt discharge needs including contacting facility and ensuring facility had received discharge summary.   CSW confirmed with [place] representative, Astrid Divine that pt fentanyl pump had been connected and spoke with RN who confirmed that pt stable for transport to be arranged.   CSW discussed with pt and pt family at bedside. Emotional support provided.   CSW arranged ambulance transport via [place].   No further social work needs identified at this time.  CSW signing off.   Alison Murray, MSW, [place] Work [phone]

## 2013-11-12 NOTE — Progress Notes (Signed)
Patient discharged to [facility], copies of all discharge medications and instructions sent to facility. Patient to be transported via [place].

## 2013-11-12 NOTE — Discharge Summary (Signed)
Physician Discharge Summary  Brandon [name] FUX:[id number] DOB: [birth year] DOA: 11/05/2013  PCP: [name], MD  Admit date: 11/05/2013 Discharge date: 11/12/2013   Recommendations for Outpatient Follow-Up:   1. The patient is being discharged to a residential hospice facility.   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  Diet recommendation: Regular.   History of Present Illness:   Brandon [name] is an 58 y.o. male with a PMH of metastatic rectal cancer treated with 14 cycles of 5-FU, currently under the care of Dr. Marcell Anger (oncology) at [facility] [phone], recent hospitalization for GI bleed, performance status of 3, last seen by his oncologist on 10/24/13 with recommendations for symptom management given his overall frail state. His oncologist attempted to discuss his CODE STATUS with him at that visit, however he did not want to engage in a conversation about this at that time. He has had problems with symptom management including issues related to nausea, anxiety, and pain. He was referred as a direct admission by his palliative care doctor, Dr. Hilma Favors, for symptom management and concerns for opiate toxicity. At this point, the patient has opted for full comfort measures.   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  Dilaudid-HP drip discontinued, now on fentanyl drip for pain  control.  Pain control achieved with minimal side effects, although he has developed some paranoia over the past 24 hours. Active Problems:  Fever / fungemia  Blood cultures sent 11/05/13. One of 2 cultures growing yeast. Treated with micafungin.  ID consultation requested, seen by Dr. Johnnye Sima who strongly recommended removing his CVC, which was removed 11/08/13.  Cultures of the CVC tip negative to date. Followup surveillance blood cultures sent 11/07/13, also negative to date.  Micafungin discontinued 11/10/13 in light of transition to full comfort measures. Anasarca  Lasix discontinued 11/11/13. Abdominal pain, chronic, right upper quadrant / cancer related pain  Pain management per palliative care team.  Functional quadriplegia  Full comfort measures instituted at this point. Protein-calorie malnutrition, severe  No further TNA. Comfort feeds only. 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 D/C to residential hospice for end of life care. Dehydration / acute kidney injury / bilateral hydronephrosis  Hydrated with normal saline.  No further lab draws in light of comfort care. IV fluids discontinued. Elevated LFTs  Likely related to liver and bone metastasis.  Anemia of chronic disease  No further monitoring in light of comfort measures.   Medical Consultants:   Dr. Isaac Laud & Dr. Rhea Pink, Palliative Care  Dr. [name], ID  Interventional radiology   Discharge Exam:   Filed Vitals:   11/12/13 0510  BP: 110/71  Pulse: 102  Temp: 98.5 F (36.9 C)  Resp: 20   Filed Vitals:   11/10/13 1357 11/10/13 2040 11/11/13 0624 11/12/13 0510  BP: 122/71  115/68 110/71  Pulse: 94 105  100 102  Temp: 97.8 F (36.6 C)  98.6 F (37 C) 98.5 F (36.9 C)  TempSrc: Oral  Oral Oral  Resp:   16 20  Height:      Weight:      SpO2: 100% 99% 100% 99%   Gen: Mildly lethargic, nauseated  Cardiovascular: Tachycardic, No M/R/G  Respiratory: Lungs diminished in the  bases  Gastrointestinal: Abdomen soft, NT/ND, + BS, +ostomy/PEG  Extremities: 3+ lower extremity edema   The results of significant diagnostics from this hospitalization (including imaging, microbiology, ancillary and laboratory) are listed below for reference.     Procedures and Diagnostic Studies:    11/06/13: Chest x-ray: Bibasilar atelectasis. No acute infiltrate.   11/08/13: Tunneled right IJ catheter removed, catheter tip submitted for culture.   11/10/13: PICC placed.  Labs:   Basic Metabolic Panel:  Recent Labs Lab 11/05/13 1350 11/06/13 1005 11/07/13 0615 11/08/13 0522 11/09/13 0420 11/10/13 0634  NA 148* 147 145 142 136* 137  K 4.1 3.5* 4.0 4.3 3.7 3.7  CL 108 112 112 110 105 104  CO2 24 22 22  18* 19 19  GLUCOSE 138* 126* 125* 170* 217* 95  BUN 30* 24* 35* 30* 22 17  CREATININE 1.15 1.16 1.01 0.91 0.93 1.07  CALCIUM 9.8 7.7* 8.2* 8.2* 7.5* 8.0*  MG 2.0  --  2.5 2.4  --   --   PHOS 4.3  --  2.3 2.2*  --   --    GFR Estimated Creatinine Clearance: 70.4 ml/min (by C-G formula based on Cr of 1.07). Liver Function Tests:  Recent Labs Lab 11/05/13 1350 11/07/13 0615 11/08/13 0522  AST 49* 53* 35  ALT 63* 67* 62*  ALKPHOS 270* 173* 216*  BILITOT 0.6 0.5 0.6  PROT 6.6 5.5* 5.4*  ALBUMIN 2.4* 2.0* 2.0*   CBC:  Recent Labs Lab 11/05/13 1350 11/07/13 0615 11/09/13 0420 11/10/13 0634  WBC 6.2 6.6 7.7 11.4*  NEUTROABS 5.4 5.7  --   --   HGB 9.7* 7.9* 7.8* 8.0*  HCT 31.5* 25.9* 24.9* 24.7*  MCV 97.2 97.7 93.6 91.8  PLT 235 165 142* 191   CBG:  Recent Labs Lab 11/09/13 0824 11/09/13 1427 11/09/13 2013 11/10/13 0229 11/10/13 0755  GLUCAP 108* 112* 187* 242* 105*   Microbiology Recent Results (from the past 240 hour(s))  MRSA PCR SCREENING     Status: None   Collection Time    11/05/13  4:21 PM      Result Value Ref Range Status   MRSA by PCR NEGATIVE  NEGATIVE Final   Comment:            The GeneXpert MRSA Assay (FDA     approved for  NASAL specimens     only), is one component of a     comprehensive MRSA colonization     surveillance program. It is not     intended to diagnose MRSA     infection nor to guide or     monitor treatment for     MRSA infections.  CULTURE, BLOOD (ROUTINE X 2)     Status: None   Collection Time    11/05/13  5:50 PM      Result Value Ref Range Status   Specimen Description BLOOD LEFT HAND   Final   Special Requests BOTTLES DRAWN AEROBIC AND ANAEROBIC 10CC   Final   Culture  Setup Time     Final   Value: 11/05/2013 22:08  Performed at [facility]     Final   Value: CANDIDA GLABRATA     Note: Gram Stain Report Called to,Read Back By and Verified With: Tempie Donning RN [phone]     Performed at [facility]   Report Status 11/10/2013 FINAL   Final  CULTURE, BLOOD (ROUTINE X 2)     Status: None   Collection Time    11/05/13  6:00 PM      Result Value Ref Range Status   Specimen Description BLOOD LEFT ARM   Final   Special Requests BOTTLES DRAWN AEROBIC AND ANAEROBIC 10CC   Final   Culture  Setup Time     Final   Value: 11/05/2013 22:08     Performed at [facility]   Culture     Final   Value: CANDIDA GLABRATA     Note: Gram Stain Report Called to,Read Back By and Verified With: Geanie Berlin 11/07/13 0648A [place]     Performed at [facility]   Report Status 11/10/2013 FINAL   Final  CULTURE, BLOOD (ROUTINE X 2)     Status: None   Collection Time    11/07/13  5:30 PM      Result Value Ref Range Status   Specimen Description BLOOD LEFT HAND   Final   Special Requests BOTTLES DRAWN AEROBIC ONLY 10CC   Final   Culture  Setup Time     Final   Value: 11/08/2013 00:36     Performed at [facility]   Culture     Final   Value:        BLOOD CULTURE RECEIVED NO GROWTH TO DATE CULTURE WILL BE HELD FOR 5 DAYS BEFORE ISSUING A FINAL NEGATIVE REPORT     Performed at [facility]   Report Status PENDING   Incomplete  CATH TIP  CULTURE     Status: None   Collection Time    11/08/13  2:05 PM      Result Value Ref Range Status   Specimen Description CATH TIP   Final   Special Requests Normal   Final   Culture     Final   Value: >100 COLONIES YEAST     Performed at [facility]   Report Status PENDING   Incomplete     Discharge Instructions:   Discharge Instructions   Call MD for:  persistant nausea and vomiting    Complete by:  As directed      Call MD for:  severe uncontrolled pain    Complete by:  As directed      Diet general    Complete by:  As directed      Increase activity slowly    Complete by:  As directed             Medication List    STOP taking these medications       acetaminophen 500 MG tablet  Commonly known as:  TYLENOL     cabergoline 0.5 MG tablet  Commonly known as:  DOSTINEX     dexamethasone 4 MG tablet  Commonly known as:  DECADRON     diazepam 5 MG tablet  Commonly known as:  VALIUM  Replaced by:  diazepam 5 MG/ML injection     fentaNYL 100 MCG/HR  Commonly known as:  DURAGESIC - dosed mcg/hr     HYDROmorphone 200 mg in sodium chloride 0.9 % 80 mL  LORazepam 1 MG tablet  Commonly known as:  ATIVAN     ondansetron 8 MG disintegrating tablet  Commonly known as:  ZOFRAN-ODT     PRESCRIPTION MEDICATION     prochlorperazine 10 MG tablet  Commonly known as:  COMPAZINE     promethazine 25 MG tablet  Commonly known as:  PHENERGAN  Replaced by:  promethazine 25 MG/ML injection     tamsulosin 0.4 MG Caps capsule  Commonly known as:  FLOMAX      TAKE these medications       dexamethasone 4 MG/ML injection  Commonly known as:  DECADRON  Inject 0.5 mLs (2 mg total) into the vein daily.     diazepam 5 MG/ML injection  Commonly known as:  VALIUM  Inject 2 mLs (10 mg total) into the vein every 4 (four) hours as needed.     DOBELLS Soln  Use as directed 15 mLs in the mouth or throat 2 (two) times daily.     fentaNYL 2,500 mcg in sodium  chloride 0.9 % 200 mL  Fentanyl 300 mcg/hr continuous infusion IV(PICC Line) with 200 mcg bolus q30 minutes prn for pain and dyspnea via CADD pump. [facility] pharmacy to determine concentration and dispense per protocol.     haloperidol lactate 5 MG/ML injection  Commonly known as:  HALDOL  Inject 0.4 mLs (2 mg total) into the vein every 6 (six) hours as needed (paranoia, terminal agitation).     pantoprazole 40 MG tablet  Commonly known as:  PROTONIX  Take 1 tablet (40 mg total) by mouth 2 (two) times daily.     promethazine 25 MG/ML injection  Commonly known as:  PHENERGAN  Inject 1 mL (25 mg total) into the vein every 4 (four) hours as needed.     promethazine 25 MG/ML injection  Commonly known as:  PHENERGAN  Inject 1 mL (25 mg total) into the vein every 12 (twelve) hours.     scopolamine 1 MG/3DAYS  Commonly known as:  TRANSDERM-SCOP  Place 1 patch (1.5 mg total) onto the skin every 3 (three) days.          Time coordinating discharge: 35 minutes.  Signed:  Kiari Hosmer  Pager [phone] [facility] 11/12/2013, 12:52 PM

## 2013-11-12 NOTE — Progress Notes (Signed)
Progress Note   Brandon [name] UXN:[id number] DOB: [birth year] DOA: 11/05/2013 PCP: [name], MD   Brief Narrative:   Brandon [name] is an 58 y.o. male with a PMH of metastatic rectal cancer treated with 14 cycles of 5-FU, currently under the care of Dr. Marcell Anger (oncology) at [facility] [phone], recent hospitalization for GI bleed, performance status of 3, last seen by his oncologist [name] 10/24/13 with recommendations for symptom management given his overall frail state. His oncologist attempted to discuss his CODE STATUS with him at that visit, however he did not want to engage in a conversation about this at that time. He has had problems with symptom management including issues related to nausea, anxiety, and pain. He was referred as a direct admission by his palliative care doctor, Dr. Hilma Favors, for symptom management and concerns for opiate toxicity. At this point, the patient has opted for full comfort measures. Exploring discharge options, including residential hospice.  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   Dilaudid-HP drip discontinued, now [name] fentanyl for pain control.  Pain control achieved with minimal side effects, although he has developed some paranoia over the past 24 hours.  Active Problems:  Fever / fungemia  Blood cultures sent 11/05/13. One of 2 cultures growing yeast. Treated with micafungin.  ID consultation requested, seen by Dr. Johnnye Sima who strongly recommended removing his CVC, which was removed 11/08/13.  Cultures of the CVC tip negative to date. Followup surveillance blood cultures sent 11/07/13, also negative to date.  Micafungin discontinued 11/10/13 in light of transition to full comfort measures.  Anasarca   Lasix discontinued 11/11/13.  Abdominal pain, chronic, right upper quadrant / cancer related  pain   Pain management per palliative care team.   Functional quadriplegia   Full comfort measures at this point.  Protein-calorie malnutrition, severe   No further TNA. Comfort feeds only.  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 Palliative care following.  Dehydration / acute kidney injury / bilateral hydronephrosis  Hydrated with normal saline.  No further lab draws in light of comfort care. IV fluids discontinued.  Elevated LFTs   Likely related to liver and bone metastasis.   Anemia of chronic disease   No further monitoring in light of comfort measures.  DVT prophylaxis   Lovenox ordered.  Code Status: DNR  Family Communication: Father at bedside.  Disposition Plan: Residential hospice placement versus in-hospital death.    IV Access:    Double lumen CVC right subclavian removed 11/08/13.  PICC placed 11/10/13.   Procedures and diagnostic studies:    11/06/13: Chest x-ray: Bibasilar atelectasis. No acute infiltrate.  11/08/13: Tunneled right IJ catheter removed, catheter tip submitted for culture.  11/10/13: PICC placed.   Medical Consultants:    Dr. Isaac Laud & Dr. Rhea Pink, Palliative Care  Dr. [name], ID  Interventional radiology   Other Consultants:    Physical therapy   Anti-Infectives:    Micafungin 11/07/13---> 11/10/13   Subjective:   [name] has had no further fever or rigors since his CVC was removed. He has had some recurrent nausea with vomiting.  PEG tube seemed to be clogged, and was flushed with immediate return of bilious drainage.  Objective:    Filed Vitals:   11/10/13 1357 11/10/13 2040 11/11/13 0624 11/12/13 0510  BP: 122/71  115/68 110/71  Pulse: 94 105 100 102  Temp: 97.8 F (36.6 C)  98.6 F (37 C) 98.5 F (36.9 C)  TempSrc: Oral  Oral Oral  Resp:   16 20  Height:      Weight:      SpO2: 100% 99% 100% 99%    Intake/Output Summary (Last 24 hours) at 11/12/13  0754 Last data filed at 11/12/13 0517  Gross per 24 hour  Intake    280 ml  Output   3550 ml  Net  -3270 ml    Exam: Gen:  Mildly lethargic, nauseated  Cardiovascular:  Tachycardic, No M/R/G Respiratory:  Lungs diminished in the bases Gastrointestinal:  Abdomen soft, NT/ND, + BS, +ostomy/PEG Extremities:  3+ lower extremity edema    Data Reviewed:    Labs: Basic Metabolic Panel:  Recent Labs Lab 11/05/13 1350 11/06/13 1005 11/07/13 0615 11/08/13 0522 11/09/13 0420 11/10/13 0634  NA 148* 147 145 142 136* 137  K 4.1 3.5* 4.0 4.3 3.7 3.7  CL 108 112 112 110 105 104  CO2 24 22 22  18* 19 19  GLUCOSE 138* 126* 125* 170* 217* 95  BUN 30* 24* 35* 30* 22 17  CREATININE 1.15 1.16 1.01 0.91 0.93 1.07  CALCIUM 9.8 7.7* 8.2* 8.2* 7.5* 8.0*  MG 2.0  --  2.5 2.4  --   --   PHOS 4.3  --  2.3 2.2*  --   --    GFR Estimated Creatinine Clearance: 70.4 ml/min (by C-G formula based [name] Cr of 1.07). Liver Function Tests:  Recent Labs Lab 11/05/13 1350 11/07/13 0615 11/08/13 0522  AST 49* 53* 35  ALT 63* 67* 62*  ALKPHOS 270* 173* 216*  BILITOT 0.6 0.5 0.6  PROT 6.6 5.5* 5.4*  ALBUMIN 2.4* 2.0* 2.0*   CBC:  Recent Labs Lab 11/05/13 1350 11/07/13 0615 11/09/13 0420 11/10/13 0634  WBC 6.2 6.6 7.7 11.4*  NEUTROABS 5.4 5.7  --   --   HGB 9.7* 7.9* 7.8* 8.0*  HCT 31.5* 25.9* 24.9* 24.7*  MCV 97.2 97.7 93.6 91.8  PLT 235 165 142* 191   Microbiology Recent Results (from the past 240 hour(s))  MRSA PCR SCREENING     Status: None   Collection Time    11/05/13  4:21 PM      Result Value Ref Range Status   MRSA by PCR NEGATIVE  NEGATIVE Final   Comment:            The GeneXpert MRSA Assay (FDA     approved for NASAL specimens     only), is one component of a     comprehensive MRSA colonization     surveillance program. It is not     intended to diagnose MRSA     infection nor to guide or     monitor treatment for     MRSA infections.  CULTURE, BLOOD (ROUTINE X 2)      Status: None   Collection Time    11/05/13  5:50 PM      Result Value Ref Range Status   Specimen Description BLOOD LEFT HAND   Final   Special Requests BOTTLES DRAWN AEROBIC AND ANAEROBIC 10CC   Final   Culture  Setup Time     Final   Value: 11/05/2013 22:08     Performed at [facility]   Culture     Final   Value: CANDIDA GLABRATA     Note: Gram Stain Report Called to,Read Back  By and Verified With: Tempie Donning RN [phone]     Performed at [facility]   Report Status 11/10/2013 FINAL   Final  CULTURE, BLOOD (ROUTINE X 2)     Status: None   Collection Time    11/05/13  6:00 PM      Result Value Ref Range Status   Specimen Description BLOOD LEFT ARM   Final   Special Requests BOTTLES DRAWN AEROBIC AND ANAEROBIC 10CC   Final   Culture  Setup Time     Final   Value: 11/05/2013 22:08     Performed at [facility]   Culture     Final   Value: CANDIDA GLABRATA     Note: Gram Stain Report Called to,Read Back By and Verified With: Geanie Berlin 11/07/13 0648A Filer     Performed at [facility]   Report Status 11/10/2013 FINAL   Final  CULTURE, BLOOD (ROUTINE X 2)     Status: None   Collection Time    11/07/13  5:30 PM      Result Value Ref Range Status   Specimen Description BLOOD LEFT HAND   Final   Special Requests BOTTLES DRAWN AEROBIC ONLY 10CC   Final   Culture  Setup Time     Final   Value: 11/08/2013 00:36     Performed at [facility]   Culture     Final   Value:        BLOOD CULTURE RECEIVED NO GROWTH TO DATE CULTURE WILL BE HELD FOR 5 DAYS BEFORE ISSUING A FINAL NEGATIVE REPORT     Performed at [facility]   Report Status PENDING   Incomplete  CATH TIP CULTURE     Status: None   Collection Time    11/08/13  2:05 PM      Result Value Ref Range Status   Specimen Description CATH TIP   Final   Special Requests Normal   Final   Culture     Final   Value: >100 COLONIES YEAST     Performed at [facility]    Report Status PENDING   Incomplete     Medications:   . clonazePAM  1 mg Oral BID  . dexamethasone  2 mg Oral Q breakfast  . diazepam  5 mg Intravenous TID  . DOBELLS  15 mL Mouth/Throat BID  . Gerhardt's butt cream   Topical BID  . pantoprazole  40 mg Oral BID  . promethazine  25 mg Intravenous Q12H  . sodium chloride  10-40 mL Intracatheter Q12H   Continuous Infusions: . fentaNYL infusion INTRAVENOUS 250 mcg/hr (11/11/13 2331)    Time spent: 25 minutes.    LOS: 7 days   [facility] Pager [phone]. If unable to reach me by pager, please call my cell phone at [phone].  *Please refer to [email or web address], password [email or web address] to get updated schedule [name] who will round [name] this patient, as hospitalists switch teams weekly. If 7PM-7AM, please contact night-coverage at [email or web address], password [email or web address] for any overnight needs.  11/12/2013, 7:54 AM    **Disclaimer: This note was dictated with voice recognition software. Similar sounding words can inadvertently be transcribed and this note may contain transcription errors which may not have been corrected upon publication of note.**

## 2013-11-12 NOTE — Progress Notes (Signed)
Wasted 20cc of fentanyl drip with Rick Duff as witness.

## 2013-11-12 NOTE — Progress Notes (Signed)
    [facility]  Brandon [name] is a 58 y.o. male with metastatic rectal cancer.  He is feeling better after tube was placed to drain. He is going to pure palliative care and hospice. Abx have been stopped. Agree.   LOS: 7 days   Alcide Evener 11/12/2013, 3:23 PM

## 2013-11-14 LAB — CULTURE, BLOOD (ROUTINE X 2): Culture: NO GROWTH

## 2013-11-14 LAB — CATH TIP CULTURE
Culture: >100
SPECIAL REQUESTS: NORMAL

## 2013-12-11 DEATH — deceased

## 2014-04-25 ENCOUNTER — Encounter (HOSPITAL_COMMUNITY): Payer: Self-pay | Admitting: Gastroenterology
